# Patient Record
Sex: Female | Born: 1980
Health system: Southern US, Community
[De-identification: ages and names within clinical notes are randomized; demographics above are authoritative.]

## PROBLEM LIST (undated history)

## (undated) ENCOUNTER — Inpatient Hospital Stay (HOSPITAL_COMMUNITY): Payer: Self-pay

## (undated) DIAGNOSIS — B019 Varicella without complication: Secondary | ICD-10-CM

## (undated) DIAGNOSIS — K219 Gastro-esophageal reflux disease without esophagitis: Secondary | ICD-10-CM

## (undated) DIAGNOSIS — T8859XA Other complications of anesthesia, initial encounter: Secondary | ICD-10-CM

## (undated) DIAGNOSIS — R002 Palpitations: Secondary | ICD-10-CM

## (undated) DIAGNOSIS — F419 Anxiety disorder, unspecified: Secondary | ICD-10-CM

## (undated) DIAGNOSIS — D649 Anemia, unspecified: Secondary | ICD-10-CM

## (undated) HISTORY — DX: Varicella without complication: B01.9

## (undated) HISTORY — PX: BREAST BIOPSY: SHX20

## (undated) HISTORY — DX: Gastro-esophageal reflux disease without esophagitis: K21.9

## (undated) HISTORY — PX: TONSILLECTOMY: SUR1361

---
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
## 2002-01-09 ENCOUNTER — Emergency Department (HOSPITAL_COMMUNITY): Admission: EM | Admit: 2002-01-09 | Discharge: 2002-01-10 | Payer: Self-pay | Admitting: Emergency Medicine

## 2002-01-12 ENCOUNTER — Emergency Department (HOSPITAL_COMMUNITY): Admission: EM | Admit: 2002-01-12 | Discharge: 2002-01-12 | Payer: Self-pay | Admitting: Emergency Medicine

## 2002-05-04 ENCOUNTER — Emergency Department (HOSPITAL_COMMUNITY): Admission: EM | Admit: 2002-05-04 | Discharge: 2002-05-04 | Payer: Self-pay | Admitting: Emergency Medicine

## 2003-08-27 ENCOUNTER — Emergency Department (HOSPITAL_COMMUNITY): Admission: EM | Admit: 2003-08-27 | Discharge: 2003-08-27 | Payer: Self-pay | Admitting: Emergency Medicine

## 2004-11-24 ENCOUNTER — Emergency Department (HOSPITAL_COMMUNITY): Admission: EM | Admit: 2004-11-24 | Discharge: 2004-11-25 | Payer: Self-pay | Admitting: Emergency Medicine

## 2005-02-28 ENCOUNTER — Emergency Department (HOSPITAL_COMMUNITY): Admission: EM | Admit: 2005-02-28 | Discharge: 2005-02-28 | Payer: Self-pay | Admitting: Emergency Medicine

## 2005-03-02 ENCOUNTER — Emergency Department (HOSPITAL_COMMUNITY): Admission: EM | Admit: 2005-03-02 | Discharge: 2005-03-02 | Payer: Self-pay | Admitting: Family Medicine

## 2005-03-16 ENCOUNTER — Other Ambulatory Visit: Admission: RE | Admit: 2005-03-16 | Discharge: 2005-03-16 | Payer: Self-pay | Admitting: Family Medicine

## 2005-03-16 ENCOUNTER — Encounter: Payer: Self-pay | Admitting: Family Medicine

## 2005-03-16 ENCOUNTER — Ambulatory Visit: Payer: Self-pay | Admitting: Family Medicine

## 2005-03-20 ENCOUNTER — Encounter: Admission: RE | Admit: 2005-03-20 | Discharge: 2005-03-20 | Payer: Self-pay | Admitting: Family Medicine

## 2005-04-01 ENCOUNTER — Ambulatory Visit: Payer: Self-pay | Admitting: Family Medicine

## 2005-05-13 ENCOUNTER — Ambulatory Visit: Payer: Self-pay | Admitting: Family Medicine

## 2005-06-29 LAB — CONVERTED CEMR LAB: Pap Smear: NORMAL

## 2005-11-09 ENCOUNTER — Ambulatory Visit: Payer: Self-pay | Admitting: Family Medicine

## 2006-01-23 ENCOUNTER — Ambulatory Visit: Payer: Self-pay | Admitting: Family Medicine

## 2006-01-26 ENCOUNTER — Encounter: Admission: RE | Admit: 2006-01-26 | Discharge: 2006-01-26 | Payer: Self-pay | Admitting: Family Medicine

## 2006-05-26 ENCOUNTER — Ambulatory Visit: Payer: Self-pay | Admitting: Family Medicine

## 2006-05-26 ENCOUNTER — Other Ambulatory Visit: Admission: RE | Admit: 2006-05-26 | Discharge: 2006-05-26 | Payer: Self-pay | Admitting: Family Medicine

## 2006-05-26 ENCOUNTER — Encounter: Payer: Self-pay | Admitting: Family Medicine

## 2006-05-26 LAB — CONVERTED CEMR LAB
ALT: 20 units/L (ref 0–40)
AST: 25 units/L (ref 0–37)
Albumin: 3.5 g/dL (ref 3.5–5.2)
Alkaline Phosphatase: 54 units/L (ref 39–117)
BUN: 11 mg/dL (ref 6–23)
Basophils Absolute: 0 10*3/uL (ref 0.0–0.1)
Basophils Relative: 0.5 % (ref 0.0–1.0)
CO2: 28 meq/L (ref 19–32)
Calcium: 9.3 mg/dL (ref 8.4–10.5)
Chloride: 105 meq/L (ref 96–112)
Cholesterol: 154 mg/dL (ref 0–200)
Creatinine, Ser: 0.7 mg/dL (ref 0.4–1.2)
Eosinophils Absolute: 0.1 10*3/uL (ref 0.0–0.6)
Eosinophils Relative: 2 % (ref 0.0–5.0)
GFR calc Af Amer: 132 mL/min
GFR calc non Af Amer: 109 mL/min
Glucose, Bld: 90 mg/dL (ref 70–99)
HCT: 27.4 % — ABNORMAL LOW (ref 36.0–46.0)
HDL: 48.3 mg/dL (ref >39.0)
Hemoglobin: 8.7 g/dL — ABNORMAL LOW (ref 12.0–15.0)
LDL Cholesterol: 94 mg/dL (ref 0–99)
Lymphocytes Relative: 31.7 % (ref 12.0–46.0)
MCHC: 31.6 g/dL (ref 30.0–36.0)
MCV: 56 fL — ABNORMAL LOW (ref 78.0–100.0)
Monocytes Absolute: 0.6 10*3/uL (ref 0.2–0.7)
Monocytes Relative: 13 % — ABNORMAL HIGH (ref 3.0–11.0)
Neutro Abs: 2.5 10*3/uL (ref 1.4–7.7)
Neutrophils Relative %: 52.8 % (ref 43.0–77.0)
Platelets: 449 10*3/uL — ABNORMAL HIGH (ref 150–400)
Potassium: 3.8 meq/L (ref 3.5–5.1)
RBC: 4.89 M/uL (ref 3.87–5.11)
RDW: 30.6 % — ABNORMAL HIGH (ref 11.5–14.6)
Sodium: 138 meq/L (ref 135–145)
TSH: 1.59 microintl units/mL (ref 0.35–5.50)
Total Bilirubin: 0.6 mg/dL (ref 0.3–1.2)
Total CHOL/HDL Ratio: 3.2
Total Protein: 7.4 g/dL (ref 6.0–8.3)
Triglycerides: 61 mg/dL (ref 0–149)
VLDL: 12 mg/dL (ref 0–40)
WBC: 4.7 10*3/uL (ref 4.5–10.5)

## 2006-06-02 ENCOUNTER — Ambulatory Visit: Payer: Self-pay | Admitting: Family Medicine

## 2006-06-02 LAB — CONVERTED CEMR LAB
Basophils Absolute: 0.1 10*3/uL (ref 0.0–0.1)
Basophils Relative: 1.9 % — ABNORMAL HIGH (ref 0.0–1.0)
Eosinophils Absolute: 0.1 10*3/uL (ref 0.0–0.6)
Eosinophils Relative: 1.3 % (ref 0.0–5.0)
Ferritin: 16.9 ng/mL (ref 10.0–291.0)
Folate: 16.7 ng/mL
HCT: 29.3 % — ABNORMAL LOW (ref 36.0–46.0)
Hemoglobin: 9.3 g/dL — ABNORMAL LOW (ref 12.0–15.0)
Iron: 82 ug/dL (ref 42–145)
Lymphocytes Relative: 30.9 % (ref 12.0–46.0)
MCHC: 31.8 g/dL (ref 30.0–36.0)
MCV: 57.5 fL — ABNORMAL LOW (ref 78.0–100.0)
Monocytes Absolute: 0.7 10*3/uL (ref 0.2–0.7)
Monocytes Relative: 11.9 % — ABNORMAL HIGH (ref 3.0–11.0)
Neutro Abs: 3.1 10*3/uL (ref 1.4–7.7)
Neutrophils Relative %: 54 % (ref 43.0–77.0)
Platelets: 386 10*3/uL (ref 150–400)
RBC: 5.09 M/uL (ref 3.87–5.11)
RDW: 32.2 % — ABNORMAL HIGH (ref 11.5–14.6)
Saturation Ratios: 17.9 % — ABNORMAL LOW (ref 20.0–50.0)
Transferrin: 327.8 mg/dL (ref >=212.0)
Vitamin B-12: 491 pg/mL (ref 211–911)
WBC: 5.8 10*3/uL (ref 4.5–10.5)

## 2007-06-26 ENCOUNTER — Emergency Department (HOSPITAL_COMMUNITY): Admission: EM | Admit: 2007-06-26 | Discharge: 2007-06-26 | Payer: Self-pay | Admitting: Emergency Medicine

## 2007-08-07 ENCOUNTER — Emergency Department (HOSPITAL_COMMUNITY): Admission: EM | Admit: 2007-08-07 | Discharge: 2007-08-08 | Payer: Self-pay | Admitting: Emergency Medicine

## 2007-08-08 ENCOUNTER — Emergency Department (HOSPITAL_COMMUNITY): Admission: EM | Admit: 2007-08-08 | Discharge: 2007-08-08 | Payer: Self-pay | Admitting: Emergency Medicine

## 2007-08-08 ENCOUNTER — Emergency Department (HOSPITAL_COMMUNITY): Admission: EM | Admit: 2007-08-08 | Discharge: 2007-08-08 | Payer: Self-pay | Admitting: Family Medicine

## 2007-10-09 ENCOUNTER — Emergency Department (HOSPITAL_COMMUNITY): Admission: EM | Admit: 2007-10-09 | Discharge: 2007-10-09 | Payer: Self-pay | Admitting: Emergency Medicine

## 2007-10-16 ENCOUNTER — Emergency Department (HOSPITAL_COMMUNITY): Admission: EM | Admit: 2007-10-16 | Discharge: 2007-10-16 | Payer: Self-pay | Admitting: Emergency Medicine

## 2007-10-24 ENCOUNTER — Emergency Department (HOSPITAL_BASED_OUTPATIENT_CLINIC_OR_DEPARTMENT_OTHER): Admission: EM | Admit: 2007-10-24 | Discharge: 2007-10-24 | Payer: Self-pay | Admitting: Emergency Medicine

## 2007-11-06 ENCOUNTER — Emergency Department (HOSPITAL_BASED_OUTPATIENT_CLINIC_OR_DEPARTMENT_OTHER): Admission: EM | Admit: 2007-11-06 | Discharge: 2007-11-06 | Payer: Self-pay | Admitting: Emergency Medicine

## 2007-11-20 ENCOUNTER — Emergency Department (HOSPITAL_BASED_OUTPATIENT_CLINIC_OR_DEPARTMENT_OTHER): Admission: EM | Admit: 2007-11-20 | Discharge: 2007-11-20 | Payer: Self-pay | Admitting: Emergency Medicine

## 2007-12-17 ENCOUNTER — Emergency Department (HOSPITAL_BASED_OUTPATIENT_CLINIC_OR_DEPARTMENT_OTHER): Admission: EM | Admit: 2007-12-17 | Discharge: 2007-12-17 | Payer: Self-pay | Admitting: Emergency Medicine

## 2007-12-17 ENCOUNTER — Ambulatory Visit: Payer: Self-pay | Admitting: *Deleted

## 2007-12-17 DIAGNOSIS — R030 Elevated blood-pressure reading, without diagnosis of hypertension: Secondary | ICD-10-CM | POA: Insufficient documentation

## 2007-12-17 DIAGNOSIS — K219 Gastro-esophageal reflux disease without esophagitis: Secondary | ICD-10-CM | POA: Insufficient documentation

## 2007-12-17 DIAGNOSIS — D509 Iron deficiency anemia, unspecified: Secondary | ICD-10-CM | POA: Insufficient documentation

## 2007-12-17 DIAGNOSIS — G44229 Chronic tension-type headache, not intractable: Secondary | ICD-10-CM | POA: Insufficient documentation

## 2007-12-17 DIAGNOSIS — R51 Headache: Secondary | ICD-10-CM | POA: Insufficient documentation

## 2007-12-17 DIAGNOSIS — R42 Dizziness and giddiness: Secondary | ICD-10-CM | POA: Insufficient documentation

## 2007-12-17 DIAGNOSIS — D500 Iron deficiency anemia secondary to blood loss (chronic): Secondary | ICD-10-CM | POA: Insufficient documentation

## 2007-12-17 HISTORY — DX: Elevated blood-pressure reading, without diagnosis of hypertension: R03.0

## 2007-12-21 ENCOUNTER — Ambulatory Visit: Payer: Self-pay | Admitting: *Deleted

## 2007-12-24 LAB — CONVERTED CEMR LAB
BUN: 9 mg/dL (ref 6–23)
Basophils Absolute: 0 10*3/uL (ref 0.0–0.1)
Basophils Relative: 0.6 % (ref 0.0–3.0)
CO2: 27 meq/L (ref 19–32)
Calcium: 9.6 mg/dL (ref 8.4–10.5)
Chloride: 104 meq/L (ref 96–112)
Creatinine, Ser: 0.6 mg/dL (ref 0.4–1.2)
Eosinophils Absolute: 0.1 10*3/uL (ref 0.0–0.7)
Eosinophils Relative: 1.4 % (ref 0.0–5.0)
GFR calc Af Amer: 155 mL/min
GFR calc non Af Amer: 128 mL/min
Glucose, Bld: 100 mg/dL — ABNORMAL HIGH (ref 70–99)
HCT: 31.3 % — ABNORMAL LOW (ref 36.0–46.0)
Hemoglobin: 9.9 g/dL — ABNORMAL LOW (ref 12.0–15.0)
Lymphocytes Relative: 42.7 % (ref 12.0–46.0)
MCHC: 31.6 g/dL (ref 30.0–36.0)
MCV: 65.4 fL — ABNORMAL LOW (ref 78.0–100.0)
Monocytes Absolute: 0.6 10*3/uL (ref 0.1–1.0)
Monocytes Relative: 13.6 % — ABNORMAL HIGH (ref 3.0–12.0)
Neutro Abs: 1.9 10*3/uL (ref 1.4–7.7)
Neutrophils Relative %: 41.7 % — ABNORMAL LOW (ref 43.0–77.0)
Platelets: 325 10*3/uL (ref 150–400)
Potassium: 3.7 meq/L (ref 3.5–5.1)
RBC: 4.79 M/uL (ref 3.87–5.11)
RDW: 21.3 % — ABNORMAL HIGH (ref 11.5–14.6)
Sodium: 140 meq/L (ref 135–145)
WBC: 4.5 10*3/uL (ref 4.5–10.5)

## 2007-12-31 ENCOUNTER — Ambulatory Visit: Payer: Self-pay | Admitting: *Deleted

## 2007-12-31 ENCOUNTER — Ambulatory Visit (HOSPITAL_BASED_OUTPATIENT_CLINIC_OR_DEPARTMENT_OTHER): Admission: RE | Admit: 2007-12-31 | Discharge: 2007-12-31 | Payer: Self-pay | Admitting: *Deleted

## 2008-01-17 ENCOUNTER — Ambulatory Visit: Payer: Self-pay | Admitting: *Deleted

## 2008-01-17 DIAGNOSIS — J019 Acute sinusitis, unspecified: Secondary | ICD-10-CM | POA: Insufficient documentation

## 2008-01-17 DIAGNOSIS — R002 Palpitations: Secondary | ICD-10-CM | POA: Insufficient documentation

## 2008-01-17 DIAGNOSIS — F329 Major depressive disorder, single episode, unspecified: Secondary | ICD-10-CM | POA: Insufficient documentation

## 2008-01-28 ENCOUNTER — Telehealth (INDEPENDENT_AMBULATORY_CARE_PROVIDER_SITE_OTHER): Payer: Self-pay | Admitting: *Deleted

## 2008-02-01 ENCOUNTER — Emergency Department (HOSPITAL_COMMUNITY): Admission: EM | Admit: 2008-02-01 | Discharge: 2008-02-01 | Payer: Self-pay | Admitting: Emergency Medicine

## 2008-02-01 ENCOUNTER — Emergency Department (HOSPITAL_BASED_OUTPATIENT_CLINIC_OR_DEPARTMENT_OTHER): Admission: EM | Admit: 2008-02-01 | Discharge: 2008-02-01 | Payer: Self-pay | Admitting: Emergency Medicine

## 2008-02-05 ENCOUNTER — Encounter (INDEPENDENT_AMBULATORY_CARE_PROVIDER_SITE_OTHER): Payer: Self-pay | Admitting: *Deleted

## 2008-02-05 ENCOUNTER — Telehealth (INDEPENDENT_AMBULATORY_CARE_PROVIDER_SITE_OTHER): Payer: Self-pay | Admitting: *Deleted

## 2008-02-13 ENCOUNTER — Ambulatory Visit: Payer: Self-pay | Admitting: Cardiology

## 2008-02-18 ENCOUNTER — Encounter: Payer: Self-pay | Admitting: Cardiology

## 2008-02-18 ENCOUNTER — Ambulatory Visit: Payer: Self-pay | Admitting: Cardiology

## 2008-02-22 ENCOUNTER — Encounter (INDEPENDENT_AMBULATORY_CARE_PROVIDER_SITE_OTHER): Payer: Self-pay | Admitting: *Deleted

## 2010-05-30 LAB — CONVERTED CEMR LAB
Glucose, Bld: 79 mg/dL (ref 70–99)
Hemoglobin: 11.4 g/dL — ABNORMAL LOW (ref 12.0–15.0)
TSH: 1.19 microintl units/mL (ref 0.35–5.50)

## 2010-06-01 NOTE — Assessment & Plan Note (Signed)
Summary: headache   Vital Signs:  Patient Profile:   30 Years Old Female Height:     61 inches Weight:      206 pounds O2 treatment:    Room Air Temp:     98.2 degrees F oral Pulse rate:   90 / minute Pulse rhythm:   regular Resp:     20 per minute BP sitting:   120 / 80  (right arm) Cuff size:   large  Vitals Entered By: Darra Lis RMA (January 17, 2008 8:47 AM)             Is Patient Diabetic? No     PCP:  Dr. Andrey Campanile  Chief Complaint:  Headaches and dizziness.  History of Present Illness: Patient was seen on 12/17/07 for complaint of headaches and dizziness and GERD.  She has had "vertigo" for years per her report, but nobody had ever worked up the vertigo or told her why she had vertigo.  The vertigo symptoms have not changed. After further d/w the patient, it was found that she had multiple behaviors that could have been contributing to both the headaches and dizziness including: skipping meals, had significantly less than her usual 6+ servings of caffeine on some days in the past week, had been binging on sugar, and very little water intake, excessive intake of NSAIDs, lost her eyeglasses and did not replace them, and stopped taking her iron supplement for anemia months ago. Neuro exam was benign at that appt and Patient contracted to stop skipping meals, have protein every 4 hours while awake, drink 64oz of water / day, cut out simple sugars, and slowly cut back on caffeine.  In addition, she was to get new glasses, limit her NSAID use, and restart her iron supplements.  She was to keep a headache diary.  She was to try OTC prilosec for the GERD sx.  Labs were done that showed significant anemia with a Hgb 9.9 and normal BMP.  TSH done recently was reported as normal by patient.  Patient returned on 12/31/07 reporting that the headaches were GREATLY improved and had not had one in several days, but she forgot her headache diary.  She still had the vertigo intermittently -  but less often.  She had tried not to skip meals, but was still not eating protein with each meal.  She had tried to improve her fluid intake and decrease her caffeine, but was still having 3-4 servings of caffeine / day.  She had cut back on her NSAIDs.  She was taking her iron supplement every day.  She still had not gotten new glasses. At that appt, the patient contracted to complete the missing advice as mentioned here.  We also ordered a CT scan of head for completeness.  That was negative.Patient returns today quite anxious.  She says she has been looking on the internet and talking to friends.  She says she  is very concerned about many possibilities for the vertigo and headaches.  I reassure her that the head CT was WNL and that is very comforting.  She is very tearful and much more upset than previous visits.  She says she just wants an answer for the symptoms and this is reasonable.  She has now followed most of  the advice I have given including drinking at least 64oz water / day, no  caffeine, limited NSAIDs, protein every 4-5 hours, no binging on sugar, takes her iron supplements every day.  She has  still not seen the eye doctor to replace her missing eye glasses.  She brings her headache diary.  She has had increased frequency since her last visit.  She is also recording some additional new symptoms including some left sided jaw pain and cold sensation traveling from the jaw forward toward the chin.  She records a bubbling/popping feeling in the back of her head.  She reports that her head is tender to the touch at times.  She records other various, unusual and ill-defined symptoms.  When I again bring up the possibility of depression / anxiety, she admits this may be a possibility.  She does admit to stress and increased tearfulness, decreased sleep, decreased interest, increased guilt, decreased energy, decreased concentration.  She is reluctant to accept the dx of depression or to try  medication.  Then she brings up worry over her heart.  No report of chest pain, but she relates some periods of palpitations since the last appointment.  She has been more anxious, but does not recall if the palpitations occurred when anxious.  They were only fleeting.  no other associated signs noted.    The headaches have never had photophobia, nausea, aura or classic migraine signs.  They are not band like.  The are sometimes in the sinus area, but not exclusively.    Prior Medication List:  PROTONIX 40 MG TBEC (PANTOPRAZOLE SODIUM) one tab each day FEOSOL 45 MG  TABS (IRON) Take 1 tablet by mouth once a day ANTIVERT 25 MG  TABS (MECLIZINE HCL) 1 tab every 8 hours as needed dizziness   Current Allergies (reviewed today): ! FLAGYL  Past Medical History:    Anemia-iron deficiency    GERD    vertigo    Headache  Past Surgical History:    Reviewed history from 12/17/2007 and no changes required:       Tonsillectomy   Family History:    Reviewed history from 12/17/2007 and no changes required:       Family History Diabetes 1st degree relative       Family History Hypertension  Social History:    Reviewed history from 12/17/2007 and no changes required:       Occupation:mortgage analyst in [employer]       Single       no children   Risk Factors:  Tobacco use:  never Passive smoke exposure:  yes Drug use:  no HIV high-risk behavior:  no Caffeine use:  2 drinks per day Alcohol use:  yes    Type:  liquor - rare    Has patient --       Felt need to cut down:  no       Been annoyed by complaints:  no       Felt guilty about drinking:  no       Needed eye opener in the morning:  no    Counseled to quit/cut down alcohol use:  no Exercise:  yes    Times per week:  4    Type:  cardio Seatbelt use:  100 % Sun Exposure:  occasionally  Family History Risk Factors:    Family History of MI in females < 33 years old:  no    Family History of MI in males < 93 years old:   no  PAP Smear History:    Date of Last PAP Smear:  06/29/2005   Review of Systems  The patient denies anorexia, fever, weight loss, weight gain, vision  loss, decreased hearing, hoarseness, chest pain, syncope, dyspnea on exertion, peripheral edema, prolonged cough, hemoptysis, abdominal pain, melena, hematochezia, muscle weakness, suspicious skin lesions, transient blindness, difficulty walking, abnormal bleeding, and enlarged lymph nodes.         see HPI   Physical Exam  Neurologic:     alert & oriented X3, cranial nerves II-XII intact, strength normal in all extremities, sensation intact to light touch, gait normal, and Romberg negative.   Additional Exam:     General:     Well-developed, well nourished, well hydrated, in no acute distress Head:     Normocephalic and atraumatic without obvious abnormalities.  Eyes:     No corneal or conjunctival inflammation. EOMI. PERRLA. Funduscopic exam benign, Vision grossly normal. Ears:     External ear shows no significant lesions or deformities.  Otoscopic examination reveals clear canals, tympanic membranes normal bilaterally. Hearing is grossly normal. Nose:     External nasal examination shows no deformity or inflammation. Nasal mucosa are pink and moist without lesions or exudates. Mouth:     Oral mucosa and oropharynx without lesions, erythema or exudates.  Teeth in good repair. no postnasal drip and no tongue abnormalities.   Neck:     supple, full ROM, no masses, and no thyromegaly.   Chest Wall:     No deformities, masses, or tenderness noted. Lungs:     Normal respiratory effort, chest expands symmetrically with good air flow noted. Lungs clear to auscultation with no crackles, rales or wheezes.   Heart:     Normal rate and  rhythm. S1 and S2 normal, without gallop, murmur, click, rub n  Msk:     No gross deformity noted of cervical, thoracic or lumbar spine. normal ROM. no muscle atrophy noted.   Extremities:     No  clubbing, cyanosis, edema, or deformity noted, grossly normal full range of motion with all joints.   Skin:     Intact without suspicious lesions or rashes Psych:     Cognition and judgment appear intact. Alert and cooperative with normal attention span and concentration. No apparent delusions, illusions, hallucinations, normally interactive, good eye contact, not anxious or depressed appearing, and not agitated.       Impression & Recommendations:  Problem # 1:  HEADACHE (ICD-784.0) The exact etiology is unclear.  Will TSH and recheck glucose.  BMP recently done was WNL.  CT scan was negative.  She still needs to see her eye doctor and get replacement glasses - she has been without them for months.  She also needs to see her dentist and eliminate any possible dental etiology including TMJ.  At this point, I have elected to tried low grade sinusitis symptoms with z-pak.  We will also try using fioricet for headache medication.  In addition, I am going to refer to neurology for further evaluation.  As the internet searching seems to lead to much increased anxiety, I have advised her to refrain from that for now if possible. Orders: TLB-TSH (Thyroid Stimulating Hormone) (84443-TSH) TLB-Glucose, QUANT (82947-GLU) Neurology Referral (Neuro)  Her updated medication list for this problem includes:    Fioricet 50-325-40 Mg Tabs (Butalbital-apap-caffeine) .Marland Kitchen... Take 1-2 tabs every 6 hours as needed headache  More than 45 minutes spent with patient today   Problem # 2:  ANEMIA-IRON DEFICIENCY (ICD-280.9) We will recheck her hgb now that she is on iron supplement daily. Her updated medication list for this problem includes:    Feosol 45 Mg Tabs (  Iron) ..... Take 1 tablet by mouth once a day  Orders: TLB-Hemoglobin (Hgb) (85018-HGB)   Problem # 3:  SINUSITIS, ACUTE (ICD-461.9) Patient has had some symptoms of sinusitis - although the symptoms also include a wide variety of other non-sinusits  symptoms.  To be complete, I am going to treat her with z-pak to see if we get any improvement. Her updated medication list for this problem includes:    Zithromax Z-pak 250 Mg Tabs (Azithromycin) .Marland Kitchen... As directed   Problem # 4:  PALPITATIONS (ICD-785.1) EKG with NSR at 80 bpm with no ST elevation / depression or acute changes. Reviewed with patient that we can order a Holter monitor if this continues or worsens.  Also reviewed with patient "red flag" symptoms that would require immediate medical attention. Orders: EKG w/ Interpretation (93000)   Problem # 5:  DEPRESSION (ICD-311) I do believe the patient is dealing with depression, but she is not agreeable to trying medication at this time.  She will consider it and let me know when and if she would like to start medication.  She denies any suicidal / homicidal thoughts / plans.  She does make a Engineer, manufacturing systems.  I don't believe that her headaches, vertigo and Left sided upper and lower parasthesias can be explained by psycho-social issues.  I do feel she would benefit from an antidepressant.  We will revist this idea at her next appt.  Problem # 6:  VERTIGO (ICD-780.4) Has been present for years.  Unchanged. Her updated medication list for this problem includes:    Antivert 25 Mg Tabs (Meclizine hcl) .Marland Kitchen... 1 tab every 8 hours as needed dizziness  Orders: TLB-TSH (Thyroid Stimulating Hormone) (16109-UEA) Neurology Referral (Neuro)   Complete Medication List: 1)  Protonix 40 Mg Tbec (Pantoprazole sodium) .... One tab each day 2)  Feosol 45 Mg Tabs (Iron) .... Take 1 tablet by mouth once a day 3)  Antivert 25 Mg Tabs (Meclizine hcl) .Marland Kitchen.. 1 tab every 8 hours as needed dizziness 4)  Zithromax Z-pak 250 Mg Tabs (Azithromycin) .... As directed 5)  Fioricet 50-325-40 Mg Tabs (Butalbital-apap-caffeine) .... Take 1-2 tabs every 6 hours as needed headache    Prescriptions: FIORICET 50-325-40 MG TABS (BUTALBITAL-APAP-CAFFEINE) take 1-2 tabs  every 6 hours as needed headache  #30 x 1   Entered and Authorized by:   Paulo Fruit MD   Signed by:   Paulo Fruit MD on 01/17/2008   Method used:   Electronically to        [facility] [address] (retail)       [phone]       [place], [place]  [address]       Ph: [phone]       Fax: [phone]   RxID:   [id number] ZITHROMAX Z-PAK 250 MG TABS (AZITHROMYCIN) as directed  #1 x 0   Entered and Authorized by:   Paulo Fruit MD   Signed by:   Paulo Fruit MD on 01/17/2008   Method used:   Electronically to        [facility] [address] (retail)       [phone]       [place], [place]  [address]       Ph: [phone]       Fax: [phone]   RxID:   [id number]  ][Headache Q&E-CCC]

## 2010-06-01 NOTE — Progress Notes (Signed)
Summary: wants call back  Phone Note Call from Patient   Caller: Patient Details for Reason: wants call back Summary of Call: PC FROM PT. STATING SHE GOT A MESSAGE TO CALL FOR LAB RESULTS.  PLEASE CALL HER @ [phone] Initial call taken by: Marisa Hua,  January 28, 2008 12:32 PM  Follow-up for Phone Call        Left message on patient's vm to call back. Follow-up by: Darra Lis RMA,  January 28, 2008 2:09 PM  Additional Follow-up for Phone Call Additional follow up Details #1::        Patient calling to speak with the doctor about her neurologist appointment and her symptoms - not about lab results Additional Follow-up by: Darra Lis RMA,  January 28, 2008 2:29 PM    Additional Follow-up for Phone Call Additional follow up Details #2::    Patient feeling very weak and like she is going to pass out.  Dizziness is severe today.   Her neurology appt is not for another month.  Her head CT scan and labs and EKG all have been negative in the office.   At this point, with the acute changes in symptoms, I have asked the patient to go to the ER ASAP for full evaluation.  she agrees to comply. Follow-up by: Paulo Fruit MD,  January 28, 2008 2:31 PM  Additional Follow-up for Phone Call Additional follow up Details #3:: Details for Additional Follow-up Action Taken: Attempted to call patient to check on status.  Patient did not go to the ER.  Patient states that she went home and laid down for awhile and the symptoms improved.   Today, patient states that she's had flares like the one listed above before and she always goes back to baseline with rest.  She is back to baseline today.  She has an appointment with neurology on 02/26/08 and feels she can wait for that appt, but is also on their cancellation list to get an earlier appt if one comes open  She is going to see the eye doctor Monday to be sure that is not playing a role in her headaches / dizziness.  She is  to call for any concerns.  "red flag" symptoms are reviewed that would require immediate medical attention. Additional Follow-up by: Paulo Fruit MD,  January 30, 2008 10:49 AM

## 2010-06-01 NOTE — Progress Notes (Signed)
Summary: Phone note  Phone Note Call from Patient   Reason for Call: Talk to Doctor Details for Reason: re: appt. Summary of Call: Please call pt. back about a appt. question. Thank you, Michaelle Copas Initial call taken by: Michaelle Copas,  February 05, 2008 12:24 PM  Follow-up for Phone Call        Returned patient's phone call. Left message on voice mail to return call. Follow-up by: Darra Lis RMA,  February 06, 2008 9:40 AM  Additional Follow-up for Phone Call Additional follow up Details #1::        Patient called and would like to talk to Dr. Andrey Campanile directly. Patient is still having same symptoms. Patient can be reached at [phone]. Additional Follow-up by: Darra Lis RMA,  February 06, 2008 9:41 AM  New Problems: DIZZINESS (ICD-780.4)   Additional Follow-up for Phone Call Additional follow up Details #2::    Called patient. No answer.  Left message on the machine for patient to call back when she is available by phone for several hours.  I will try again when she is available. Paulo Fruit MD,  February 06, 2008 9:53 AM  Additional Follow-up for Phone Call Additional follow up Details #3:: Details for Additional Follow-up Action Taken: Patient called.  She has now seen neurology and had a brain and neck MRI.  The neurologist told the patient that her problem with the dizziness and headaches is not neurologic.  I do not have that consult available at this time.  She also saw the optometrist and was found to need glasses, but has not obtained them - yet.  At this point, we will make an appointment with ENT and cardiology for further evaluation.  "red flag" symptoms reviewed that would require immediate medical attention Additional Follow-up by: Paulo Fruit MD,  February 06, 2008 12:03 PM  New Problems: DIZZINESS (ICD-780.4)

## 2010-06-01 NOTE — Assessment & Plan Note (Signed)
Summary: 2 WEEK ROV-CH   Vital Signs:  Patient Profile:   30 Years Old Female LMP:     12/26/2007 Height:     61 inches Weight:      203 pounds BMI:     38.50 O2 treatment:    Room Air Temp:     98.2 degrees F oral Pulse rate:   100 / minute Pulse rhythm:   regular Resp:     20 per minute BP sitting:   122 / 80  (left arm) Cuff size:   regular  Vitals Entered By: Darra Lis RMA (December 31, 2007 8:13 AM)  Menstrual History: LMP (date): 12/26/2007                 Visit Type:  follow up PCP:  Dr. Andrey Campanile  Chief Complaint:  follow up for dizziness.Marland Kitchen  History of Present Illness: Patient was seen on 12/17/07 for complaint of headaches and dizziness and GERD.  She has had "vertigo" for years per her report, but nobody had ever worked up the vertigo or told her why she had vertigo.    After further d/w the patient, it was found that she had multiple behaviors that could have been contributing to both the headaches and dizziness including: skipping meals, had significantly less than her usual 6+ servings of caffeine on some days in the past week, had been binging on sugar, and very little water intake, excessive intake of NSAIDs, lost her eyeglasses and did not replace them, and stopped taking her iron supplement for anemia months ago.  Neuro exam was benign.  Patient contracted to stop skipping meals, have protein every 4 hours while awake, drink 64oz of water / day, cut out simple sugars, and slowly cut back on caffeine.  In addition, she was to get new glasses, limit her NSAID use, and restart her iron supplements.  She was to keep a headache diary.  She was to try OTC prilosec for the GERD sx.  Labs were done that showed significant anemia with a Hgb 9.9 and normal BMP.  TSH done recently was reported as normal by patient.  Patient returns today reporting that the headaches are GREATLY improved and has not had one in several days, but she forgot her headache diary.  She still has  the vertigo intermittently - but less often.  She has tried not to skip meals, but is still not eating protein with each meal.  She has tried to improve her fluid intake and decrease her caffeine, but is still having 3-4 servings of caffeine / day.  She has cut back on her NSAIDs.  She is taking her iron supplement.  She has not gotten new glasses, yet.  The prilosec is not keeping her sx free, but has improved the GERD sx.      Updated Prior Medication List: PRILOSEC OTC 20 MG  TBEC (OMEPRAZOLE MAGNESIUM) 1-2 caps by mouth every day FEOSOL 45 MG  TABS (IRON) Take 1 tablet by mouth once a day ANTIVERT 25 MG  TABS (MECLIZINE HCL) 1 tab every 8 hours as needed dizziness  Current Allergies (reviewed today): ! FLAGYL  Past Medical History:    Reviewed history from 12/17/2007 and no changes required:       Anemia-iron deficiency       GERD       vertigo  Past Surgical History:    Reviewed history from 12/17/2007 and no changes required:       Tonsillectomy  Family History:    Reviewed history from 12/17/2007 and no changes required:       Family History Diabetes 1st degree relative       Family History Hypertension  Social History:    Reviewed history from 12/17/2007 and no changes required:       Occupation:mortgage analyst in [employer]       Single       no children   Risk Factors:     Has patient --       Felt need to cut down:  no       Been annoyed by complaints:  no       Felt guilty about drinking:  no       Needed eye opener in the morning:  no    Counseled to quit/cut down alcohol use:  no   Review of Systems       No nausea / vomiting / diarrhea / fever / chills / chest pain / SOB   Physical Exam  Neurologic:     alert & oriented X3, cranial nerves II-XII intact, strength normal in all extremities, gait normal, and Romberg negative, fundi benign, neuro intact  Additional Exam:     General:     Well-developed, well nourished, well hydrated, in no acute distress;  appropriate and cooperative   Head:     Normocephalic and atraumatic without obvious abnormalities.  Eyes:     No corneal or conjunctival inflammation. EOMI. PERRLA. Funduscopic exam benign, Vision grossly normal. Ears:     External ear shows no significant lesions or deformities.  Otoscopic examination reveals clear canals, tympanic membranes normal bilaterally. Hearing is grossly normal. Nose:     External nasal examination shows no deformity or inflammation. Nasal mucosa are pink and moist without lesions or exudates. Mouth:     Oral mucosa and oropharynx without lesions, erythema or exudates.  Teeth in good repair. no postnasal drip and no tongue abnormalities.   Neck:     supple, full ROM, no masses, and no thyromegaly.   Chest Wall:     No deformities, masses, or tenderness noted. Lungs:     Normal respiratory effort, chest expands symmetrically with good air flow noted. Lungs clear to auscultation with no crackles, rales or wheezes.   Heart:     Normal rate and  rhythm. S1 and S2 normal, without gallop, murmur, click, rub n  Msk:     No gross deformity noted of cervical, thoracic or lumbar spine. normal ROM. no muscle atrophy noted.   Extremities:     No clubbing, cyanosis, edema, or deformity noted, grossly normal full range of motion with all joints.   Skin:     Intact without suspicious lesions or rashes Psych:     Cognition and judgment appear intact. Alert and cooperative with normal attention span and concentration. No apparent delusions, illusions, hallucinations, normally interactive, good eye contact, not anxious or depressed appearing, and not agitated.       Impression & Recommendations:  Problem # 1:  HEADACHE (ICD-784.0) Much less frequent with behavioral / dietary changes - per HPI.  Will continue to work on same - still with much room for improvement.  However, for completeness, will get CT scan.  f/u in one month and bring headache diary.  "red flag" sx  reviewed that would require immediate medical attention. Orders: CT with Contrast (CT w/ contrast)   Problem # 2:  VERTIGO (ICD-780.4) Much less frequent with behavioral /  dietary changes - per HPI.  Will continue to work on same - still with much room for improvement.  Get new glasses!  However, for completeness, will get CT scan.  f/u in one month and bring headache diary.  If the vertigo continues and the CT scan is negative, will refer to ENT.    "red flag" sx reviewed that would require immediate medical attention. Her updated medication list for this problem includes:    Antivert 25 Mg Tabs (Meclizine hcl) .Marland Kitchen... 1 tab every 8 hours as needed dizziness  Orders: CT with Contrast (CT w/ contrast)   Problem # 3:  ANEMIA-IRON DEFICIENCY (ICD-280.9) conitnue meds and repeat CBC at f/u appt. Her updated medication list for this problem includes:    Feosol 45 Mg Tabs (Iron) .Marland Kitchen... Take 1 tablet by mouth once a day   Problem # 4:  GERD (ICD-530.81) patient was given samples of protonix Her updated medication list for this problem includes:    Protonix 40 Mg Tbec (Pantoprazole sodium) ..... One tab each day   Complete Medication List: 1)  Protonix 40 Mg Tbec (Pantoprazole sodium) .... One tab each day 2)  Feosol 45 Mg Tabs (Iron) .... Take 1 tablet by mouth once a day 3)  Antivert 25 Mg Tabs (Meclizine hcl) .Marland Kitchen.. 1 tab every 8 hours as needed dizziness   Patient Instructions: 1)  Please schedule a follow-up appointment in 1 month.   ]

## 2010-06-01 NOTE — Consult Note (Signed)
Summary: [facility]  [facility]   Imported By: Lanelle Bal 02/08/2008 12:46:25  _____________________________________________________________________  External Attachment:    Type:   Image     Comment:   External Document

## 2010-06-01 NOTE — Assessment & Plan Note (Signed)
Summary: new patient acute/mhf   Vital Signs:  Patient Profile:   30 Years Old Female LMP:     12/01/2007 Height:     61 inches Weight:      202 pounds BMI:     38.31 O2 treatment:    Room Air Temp:     98.8 degrees F oral Pulse rate:   93 / minute Pulse rhythm:   regular Resp:     20 per minute BP sitting:   143 / 92  (right arm) Cuff size:   regular  Pt. in pain?   yes    Location:   head    Intensity:   6    Type:       dull & pressure  Vitals Entered By: Darra Lis RMA (December 17, 2007 1:51 PM)  Menstrual History: LMP (date): 12/01/2007 LMP - Character: normal LMP - Reliable: Yes Menarche: 11 Menses interval: 28 days Menstrual flow: 7 days              Is Patient Diabetic? No     Visit Type:  acute PCP:  Dr. Laury Axon  Chief Complaint:  headache, dizziness, and Headaches.  History of Present Illness: Patient tells me that she does not have insurance and so she has had very little medical care in the past few years.  She usually sees Dr. Laury Axon, but they could not see her.  She is here for acute visit.  Patient has a long h/o headaches.  No h/o migraines.  She has had headaches now for 5 days intermittently.  Sometimes they go away with aleve and sometimes with rest, but for the past 24 hours they have been pretty constant.  The headache is occipital and frontal.  There is no associated nausea or photophobia.  No slurred speech or blurred vision.  There is some dizziness, but Patient has had trouble with "vertigo" for years.    She has never been told why she has vertigo or how to treat it.  She has never lost consciousness.  It will come on for days at a time and then resolve for months sometimes.  It has been present for several days this time.  She especially notices it when she goes from sitting to standing and when she turns her head or eyes.  It is better if she does not make sudden changes / movements of her head.   She has had a h/o "ear problems" in the  past - she gets "pressure and fluid on the ears" sometimes.    Patient skips breakfast.  Gets up at 7am and doesn't eat anything until about 1pm - sometimes just a salad.  She drinks at least 6 serve of caffeine / day and only 60 oz water / day.  She does have sugar daily - sometimes to excess.  She diets often and will change her food intake drastically from day to day.  After we start talking about these details, the patient states "Now that I think about it, I did have less caffeine in the past few days than usual."  She does not know if the decrease in caffeine came before the headaches started, but she thinks so.  She has also been under increased stress lately.  In addition, she is supposed to wear glasses - but no longer has any and hasn't been able to afford to get new ones.    She also ran out nexium and stopped taking her iron supplement.  Headaches      This is a 31 year old woman who presents with Headaches.  The patient denies nausea, vomiting, sweats, tearing of eyes, nasal congestion, sinus pain, sinus pressure, photophobia, and phonophobia.  The headache is described as intermittent, throbbing, dull, and pressure-like.  The patient denies the following high-risk features: fever, neck pain/stiffness, vision loss or change, focal weakness, altered mental status, rash, trauma, pain worse with exertion, and new type of headache.  She has been taking a lot of Aleve for her headaches.    Updated Prior Medication List: NEXIUM 40 MG  CPDR (ESOMEPRAZOLE MAGNESIUM) Take 1 tablet by mouth once a day FEOSOL 45 MG  TABS (IRON) Take 1 tablet by mouth once a day  Current Allergies (reviewed today): ! FLAGYL  Past Medical History:    Anemia-iron deficiency    GERD    vertigo  Past Surgical History:    Tonsillectomy   Family History:    Family History Diabetes 1st degree relative    Family History Hypertension  Social History:    Engineer, structural in [employer]    Single    no  children   Risk Factors:  Tobacco use:  never Passive smoke exposure:  yes Drug use:  no HIV high-risk behavior:  no Caffeine use:  2 drinks per day Alcohol use:  yes    Type:  liquor - rare    Has patient --       Felt need to cut down:  no       Been annoyed by complaints:  no       Felt guilty about drinking:  no       Needed eye opener in the morning:  no    Comments:  social    Counseled to quit/cut down alcohol use:  no Exercise:  yes    Times per week:  4    Type:  cardio Seatbelt use:  100 % Sun Exposure:  occasionally  Family History Risk Factors:    Family History of MI in females < 90 years old:  no    Family History of MI in males < 2 years old:  no  PAP Smear History:     Date of Last PAP Smear:  06/29/2005    Results:  Normal    Review of Systems       The patient complains of headaches and severe indigestion/heartburn.  The patient denies anorexia, fever, weight loss, weight gain, vision loss, decreased hearing, hoarseness, chest pain, syncope, dyspnea on exertion, peripheral edema, prolonged cough, hemoptysis, abdominal pain, melena, hematochezia, hematuria, incontinence, genital sores, muscle weakness, suspicious skin lesions, transient blindness, depression, unusual weight change, abnormal bleeding, enlarged lymph nodes, and angioedema.     Physical Exam  Neurologic:     alert & oriented X3, cranial nerves II-XII intact, strength normal in all extremities, gait normal, and Romberg negative, fundi benign, neuro intact  Additional Exam:     General:     Well-developed, well nourished, well hydrated, in no acute distress; appropriate and cooperative   Head:     Normocephalic and atraumatic without obvious abnormalities.  Eyes:     No corneal or conjunctival inflammation. EOMI. PERRLA. Funduscopic exam benign, Vision grossly normal. Ears:     External ear shows no significant lesions or deformities.  Otoscopic examination reveals clear canals,  tympanic membranes normal bilaterally. Hearing is grossly normal. Nose:     External nasal examination shows no deformity or inflammation. Nasal mucosa  are pink and moist without lesions or exudates. Mouth:     Oral mucosa and oropharynx without lesions, erythema or exudates.  Teeth in good repair. no postnasal drip and no tongue abnormalities.   Neck:     supple, full ROM, no masses, and no thyromegaly.   Chest Wall:     No deformities, masses, or tenderness noted. Lungs:     Normal respiratory effort, chest expands symmetrically with good air flow noted. Lungs clear to auscultation with no crackles, rales or wheezes.   Heart:     Normal rate and  rhythm. S1 and S2 normal, without gallop, murmur, click, rub  Abdomen:     Bowel sounds positive, abdomen soft and non-tender without masses, organomegaly or hernias noted. no distention  Msk:     No gross deformity noted of cervical, thoracic or lumbar spine. normal ROM. no muscle atrophy noted.   Extremities:     No clubbing, cyanosis, edema, or deformity noted, grossly normal full range of motion with all joints.   Skin:     Intact without suspicious lesions or rashes Psych:     Cognition and judgment appear intact. Alert and cooperative with normal attention span and concentration. No apparent delusions, illusions, hallucinations, normally interactive, good eye contact, not anxious or depressed appearing, and not agitated.       Impression & Recommendations:  Problem # 1:  HEADACHE (ICD-784.0) Benign neuro exam.   I believe this is multi-factorial.  Possibly including rebound from NSAIDS, caffeine headaches, eye strain since she does not have her glasses,  hypoglycemia from skipping meals and sugar binges, dehydration, severe anemia since she stopped her supplement, etc.   She was offered a CT scan, but declined due to having no insurance.  We will try having patient eat three balanced meals with protein and protein every 4 hours  throughout the day, she will SLOWLY reduce her caffeine to one serve or less, she will increase her fluid intake.  She agrees to see an eye doctor for new glasses.  She agrees to cut back on sugar binges.  She agrees to keep a headache diary.  She agrees to seek immediate medical attention if any of the "red flag" sx occur.  all questions anaswered.  She will f/u in 2 weeks.  She will call with any concerns. Orders: TLB-CBC Platelet - w/Differential (85025-CBCD) TLB-BMP (Basic Metabolic Panel-BMET) (80048-METABOL)   Problem # 2:  VERTIGO (ICD-780.4) I believe the vertigo may also be multi-factorial.  She will make changes as above and we will check some labs.  She states that she had her TSH checked within the last 4 months and it was WNL.  She does give some hx of sinus / ear problems in the past, but none today.   Orders: TLB-CBC Platelet - w/Differential (85025-CBCD) TLB-BMP (Basic Metabolic Panel-BMET) (80048-METABOL)  Her updated medication list for this problem includes:    Antivert 25 Mg Tabs (Meclizine hcl) .Marland Kitchen... 1 tab every 8 hours as needed dizziness   Problem # 3:  ANEMIA-IRON DEFICIENCY (ICD-280.9) will recheck her CBC Her updated medication list for this problem includes:    Feosol 45 Mg Tabs (Iron) .Marland Kitchen... Take 1 tablet by mouth once a day   Problem # 4:  GERD (ICD-530.81) She would like to use OTC due to cost.  Will try prilosec OTC 2 caps each day.  Her updated medication list for this problem includes:    Prilosec Otc 20 Mg Tbec (Omeprazole magnesium) .Marland KitchenMarland KitchenMarland KitchenMarland Kitchen  1-2 caps by mouth every day   Problem # 5:  ELEVATED BP READING WITHOUT DX HYPERTENSION (ICD-796.2) BP ususally runs 120-130/70-80.  Patient has it checked weekly at work.  Today she has a headache.  will recheck at f/u appt.  Complete Medication List: 1)  Prilosec Otc 20 Mg Tbec (Omeprazole magnesium) .Marland Kitchen.. 1-2 caps by mouth every day 2)  Feosol 45 Mg Tabs (Iron) .... Take 1 tablet by mouth once a day 3)  Antivert  25 Mg Tabs (Meclizine hcl) .Marland Kitchen.. 1 tab every 8 hours as needed dizziness   Patient Instructions: 1)  Please schedule a follow-up appointment in 2 weeks.   Prescriptions: ANTIVERT 25 MG  TABS (MECLIZINE HCL) 1 tab every 8 hours as needed dizziness  #30 x 2   Entered and Authorized by:   Paulo Fruit MD   Signed by:   Paulo Fruit MD on 12/17/2007   Method used:   Electronically sent to ...       [facility] [address]       [address]       [place], [place]  [address]       Ph: [phone]       Fax: [phone]   RxID:   [id number]  ]

## 2010-09-14 NOTE — Assessment & Plan Note (Signed)
[facility]                            CARDIOLOGY OFFICE NOTE   KAELEIGH, WESTENDORF                      MRN:          [id number]  DATE:02/13/2008                            DOB:          April 17, 1981    Ms. Kiara Hall is a very pleasant 30 year old female who I am asked to  evaluate for dizziness and palpitations.  She has no prior cardiac  history.  She apparently has had intermittent palpitations for years.  However, they have increased in frequency recently.  They are is  described as her heart flop.  They are not sustained palpitations.  She has not had associated dyspnea, chest pain or syncope.  However,  these are particularly bothersome to her.  She also recently is being  evaluated for dizziness.  Her dizziness is present essentially at all  times.  It does increase with certain head movements and also with lying  on left side.  She also states that towards the end of walk she notices  it more.  She has not had frank syncope.  She has been seen by  neurologist and apparently an MRI has been normal.  Because of the  above, we were asked to further evaluate.  Note, she is not having  dyspnea on exertion, orthopnea, PND, pedal edema or exertional chest  pain.   MEDICATIONS:  1. Iron.  2. Protonix 40 mg p.o. daily.  3. She also takes Xanax as needed.  4. Aleve.   ALLERGIES:  She has an allergy to FLAGYL.   SOCIAL HISTORY:  She does not smoke.  She rarely consumes alcohol.  She  denies any drug use.   FAMILY HISTORY:  Negative for coronary artery disease in her immediate  family.  She does state that she had an uncle who had a myocardial  infarction in his 41s.   PAST MEDICAL HISTORY:  There is no diabetes mellitus, hypertension or  hyperlipidemia.  She does have a history of gastroesophageal disease.  She has had a previous tonsillectomy.  She has a history of anxiety by  her report.  She also has a history of anemia due to heavy  menstrual  cycles.   REVIEW OF SYSTEMS:  She denies any headaches, fevers or chills.  There  is no productive cough or hemoptysis.  There is no dysphagia,  odynophagia or hematochezia.  Her stools are dark due to iron.  There is  no dysuria or hematuria.  There is no rash or seizure activity.  There  is no orthopnea, PND or pedal edema.  She does have heavy menstrual  cycles.  Remaining systems are negative.   PHYSICAL EXAMINATION:  VITAL SIGNS:  Today, shows a blood pressure of  145/80 and pulse is 84.  She weighs 207 pounds.  GENERAL:  She is well-developed, well-nourished, in no acute distress.  SKIN:  Warm, dry.  She does not depressed.  There is no peripheral  clubbing.  BACK:  Normal.  HEENT:  Normal with normal eyelids.  NECK:  Supple with normal upstroke bilaterally.  There is no bruits  noted.  There  is no jugular distention.  I cannot appreciate  thyromegaly.  CHEST:  Clear to auscultation.  No expansion.  CARDIOVASCULAR:  Regular rhythm.  Normal S1 and S2.  There are no  murmurs, rubs or gallops noted.  There is no change Valsalva.  ABDOMEN:  Nontender, distended.  Positive bowel sounds.  No  hepatosplenomegaly.  No masses appreciated.  There is no abdominal  bruit.  She has 2+ femoral pulses bilaterally.  No bruits.  EXTREMITIES:  No edema that I can palpate.  No cords.  She has 2+  dorsalis pedis pulses bilaterally.  NEUROLOGIC:  Grossly intact.   Her electrocardiogram today shows sinus rhythm at a rate of 83.  The  axis is normal.  There are no significant ST changes.  Note, there is no  delta wave.   DIAGNOSES:  1. Palpitations - These sounds to be most likely premature ventricular      contractions or premature atrial contractions.  They are particular      bothersome to her.  We will schedule her to have an echocardiogram      to quantify her left ventricular function and I will also schedule      her to have a CardioNet monitor.  If they indeed are premature       beats and they become very symptomatic, then we could consider      adding a beta-blocker in the future.  We discussed this in depth      today.  2. Dizziness - Etiology of this is unclear to me, but apparently this      presents essentially all of time.  It does not sound cardiac      related.  We will not pursue this further.  Note, she has had a      neurology evaluation.  I will ask her to follow up with Dr. Andrey Campanile      concerning this issue.  3. Gastroesophageal reflux disease.  4. History of anxiety.   We will see her back in 4-6 weeks to review the above.     Madolyn Frieze Jens Som, MD, [facility]  Electronically Signed    BSC/MedQ  DD: 02/13/2008  DT: 02/13/2008  Job #: [id number]   cc:   Paulo Fruit, MD

## 2010-09-14 NOTE — Consult Note (Signed)
Kiara Hall, Kiara Hall               ACCOUNT NO.:  [id number]   MEDICAL RECORD NO.:  [id number]          PATIENT TYPE:  EMS   LOCATION:  [facility]                         FACILITY:  [facility]   PHYSICIAN:  Deanna Artis. Hickling, M.D.DATE OF BIRTH:  04-15-1981   DATE OF CONSULTATION:  02/01/2008  DATE OF DISCHARGE:  02/01/2008                                 CONSULTATION   CHIEF COMPLAINT:  Left-sided numbness and dizziness.   HISTORY OF THE PRESENT CONDITION:  Kiara Hall is a 30 year old single  right-handed African American woman who presents with a 73-month history  of headache that was bioccipital and switched to the left occipital  region in her head, this was more a feeling of pressure and was not  migrainous in nature.  At that time, she had scalp tenderness.   Recently, she has had feelings of dizziness, which she describes both as  a feeling of unsteadiness and feeling as if she will pass out.  In point  of fact, she has not fainted.  When she gets upright, she feels  lightheaded.  This has been present for 2 months.   Within the past several weeks, the patient has experienced tingling that  spreads from her scalp down into her back, sometimes down into her arm,  fingers, and leg.  This is not a L'hermitte's sign.  The patient fell  downstairs on her buttocks in August 2009, but did not hit her head.  The patient has complained of blurred vision when she looks at her  computer screen.  She has not had diplopia, dysarthria, dysphagia, true  weakness, loss of bowel and bladder control, seizures, and tic disorder.   REVIEW OF SYSTEMS:  The patient goes to bed at 11 o'clock and sleeps  until 7:30 soundly.  Her appetite is good.  She said that she lost 23  pounds but that was when she was working out several times a week, which  she has not been able to do for the last 2 months.  HEAD AND NECK:  The  patient has some tenderness behind the left ear and the mastoid region,  also has a  ringing and popping sound in her left ear.  The ear  examination has been normal.  No pharyngitis or sinusitis.  PULMONARY:  No asthma, bronchitis, or pneumonia.  CARDIOVASCULAR:  The patient has  had some palpitations with normal EKGs.  No chest pain or hypertension.  ABDOMEN:  No nausea, vomiting, diarrhea, distention, or pain.  GENITOURINARY:  No urinary tract infection or hematuria.  MUSCULOSKELETAL:  No fractures, sprains, or deformities.  SKIN:  No rash  or cutaneous lesions.  HEMATOLOGIC:  The patient has iron deficiency  anemia and has been treated with iron.  She has heavy menstrual periods.  No sickle trait lymphadenopathy or easy bruisability.  ENDOCRINE:  No  diabetes or thyroid disease.  The patient has had her thyroid function  checked and has been normal.  NEUROPSYCHIATRIC:  The patient has been  very anxious about her condition and she feels that this is __________  work but she denies that there  is other issues with anxiety.  REPRODUCTIVE:  The patient has not been pregnant.  She has regular  periods.  At one point, she was on oral contraceptives.  She is not at  this time.  NEUROLOGIC:  See above.  The patient also says that she has  mild tremors.  A 12-system review is otherwise negative.   PAST MEDICAL HISTORY:  No serious illnesses, injuries, or  hospitalizations.   PAST SURGICAL HISTORY:  The patient had tonsillectomy as a teenager.  She has not been pregnant.   FAMILY HISTORY:  Negative for symptoms similar to hers and in particular  no seizures, mental retardation, multiple sclerosis, blindness,  deafness, dementia, or other familiar or acquired neurologic disease.   SOCIAL HISTORY:  The patient lives with her boyfriend.  She has regular  sex, often protected with a condom but not rigorously so.  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  The patient does not use  drugs or tobacco.  She occasionally drinks alcohol.   PHYSICAL EXAMINATION:  GENERAL:  This is an attractive overweight obese  pleasant right-handed woman in no acute distress.  VITAL SIGNS:  Temperature 98.1, blood pressure 142/92, resting pulse 92,  respirations 19, and oxygen saturation 98%.  HEAD, EYES, EARS, NOSE AND THROAT:  No signs of infection.  NECK:  Supple, full range of motion.  No craniocervical bruits.  LUNGS:  Clear to auscultation.  HEART:  No murmurs.  Pulse is normal.  ABDOMEN:  Soft, nontender.  Bowel sounds normal.  EXTREMITIES:  Well formed without edema, cyanosis, alterations in tone  or tight heel cords.  SKIN:  No lesions.  VASCULAR:  Tone was normal.  NEUROLOGIC:  Mental Status:  The patient was awake, alert, attentive,  and appropriate.  No dysphasia or dyspraxia.  Names objects, follows  commands, conveys thoughts and feelings.  Cranial nerves:  Round and  reactive pupils.  Normal fundi.  Full visual fields to double  simultaneous stimuli.  Extraocular movements full and conjugate.  Symmetric facial strength and sensation.  Air conduction greater than  bone conduction bilaterally.  Motor examination:  Normal strength, tone,  and mass.  Good fine motor movements.  No pronator drift.  Sensation  intact to cold vibration and stereognosis.  Cerebellar examination:  Good finger-to-nose, rapid alternating movements.  Gait and station were  normal.  She sways a bit with her eyes closed, but did not fall.  She  can get up on her heels and toes.  She has to walk very carefully and in  a tandem, but did not fall away from placing her feet.  Deep tendon  reflexes were symmetric and diminished.  The patient had bilateral  flexor plantar responses.   IMPRESSION:  1. Numbness 782.0.  2. Dizziness 780.4.  3. Headache 784.0.   I find no focal neurologic deficits in this patient.  Nonetheless, the   consolations of symptoms could represent demyelinating disease.  I have  reviewed her CT scan of the brain and it is normal.  We will perform an  MRI scan of the brain without and with contrast and MRI of the cervical  spine without and with contrast to look for demyelinating disease.  If  negative, I will attempt to reassure the patient and then we will plan  to follow her periodically.  If positive, we will have to discuss  treatment at another time during an office visit.  MRI of the head and  cervical spine without and with contrast was normal.   I appreciate the opportunity to participate in her care.       Deanna Artis. Sharene Skeans, M.D.  Electronically Signed     [facility]/MEDQ  D:  02/01/2008  T:  02/02/2008  Job:  [id number]   cc:   Dr. Andrey Campanile

## 2010-12-07 ENCOUNTER — Emergency Department (HOSPITAL_BASED_OUTPATIENT_CLINIC_OR_DEPARTMENT_OTHER)
Admission: EM | Admit: 2010-12-07 | Discharge: 2010-12-07 | Disposition: A | Discharge status: Home / Self Care | Payer: Self-pay | Attending: Emergency Medicine | Admitting: Emergency Medicine

## 2010-12-07 ENCOUNTER — Emergency Department (INDEPENDENT_AMBULATORY_CARE_PROVIDER_SITE_OTHER): Payer: Self-pay

## 2010-12-07 ENCOUNTER — Encounter: Payer: Self-pay | Admitting: *Deleted

## 2010-12-07 DIAGNOSIS — J40 Bronchitis, not specified as acute or chronic: Secondary | ICD-10-CM | POA: Insufficient documentation

## 2010-12-07 DIAGNOSIS — R05 Cough: Secondary | ICD-10-CM

## 2010-12-07 DIAGNOSIS — R059 Cough, unspecified: Secondary | ICD-10-CM

## 2010-12-07 LAB — PREGNANCY, URINE: Preg Test, Ur: NEGATIVE

## 2010-12-07 MED ORDER — AZITHROMYCIN 250 MG PO TABS: 250.0000 mg | ORAL_TABLET | Freq: Every day | ORAL | Status: AC

## 2010-12-07 MED ORDER — DESLORATADINE 5 MG PO TABS: 5.0000 mg | ORAL_TABLET | Freq: Every day | ORAL | Status: DC

## 2010-12-07 MED ORDER — IPRATROPIUM BROMIDE 0.02 % IN SOLN
0.5000 mg | Freq: Once | RESPIRATORY_TRACT | Status: AC
  Administered 2010-12-07: 0.5 mg via RESPIRATORY_TRACT
  Filled 2010-12-07: qty 2.5

## 2010-12-07 MED ORDER — ALBUTEROL SULFATE HFA 108 (90 BASE) MCG/ACT IN AERS: 1.0000 | INHALATION_SPRAY | Freq: Four times a day (QID) | RESPIRATORY_TRACT | Status: DC | PRN

## 2010-12-07 MED ORDER — ALBUTEROL SULFATE (5 MG/ML) 0.5% IN NEBU
5.0000 mg | INHALATION_SOLUTION | Freq: Once | RESPIRATORY_TRACT | Status: AC
  Administered 2010-12-07: 5 mg via RESPIRATORY_TRACT
  Filled 2010-12-07: qty 1

## 2010-12-07 NOTE — ED Notes (Signed)
Pt amb to room 4 with quick steady gait, moist cough noted, pt states she feels hot at times but does not think she is having fevers.  Taking nyquil last dose at 4:30am.  Coughing up green and yellow.

## 2010-12-07 NOTE — ED Provider Notes (Signed)
History     CSN: [id number] Arrival date & time: 12/07/2010  9:39 AM  Chief Complaint  Patient presents with  . Cough   Patient is a 30 y.o. female presenting with cough. The history is provided by the patient. No language interpreter was used.  Cough This is a new problem. The current episode started more than 1 week ago. The problem occurs constantly. The problem has not changed since onset.The cough is productive of purulent sputum. There has been no fever. Associated symptoms include rhinorrhea and wheezing. Pertinent negatives include no chest pain, no chills, no sweats, no weight loss, no ear congestion, no headaches, no sore throat, no shortness of breath and no eye redness. She has tried decongestants and cough syrup for the symptoms. The treatment provided mild relief. Risk factors: nonne. She is not a smoker. Her past medical history does not include bronchitis, pneumonia, bronchiectasis, COPD, emphysema or asthma.    No past medical history on file.  No past surgical history on file.  No family history on file.  History  Substance Use Topics  . Smoking status: Not on file  . Smokeless tobacco: Not on file  . Alcohol Use: Not on file    OB History    No data available      Review of Systems  Constitutional: Negative for chills and weight loss.  HENT: Positive for congestion and rhinorrhea. Negative for sore throat and facial swelling.   Eyes: Negative for discharge and redness.  Respiratory: Positive for cough and wheezing. Negative for shortness of breath.   Cardiovascular: Negative for chest pain.  Gastrointestinal: Negative.   Genitourinary: Negative for difficulty urinating.  Musculoskeletal: Negative.   Skin: Negative.   Neurological: Negative for headaches.  Hematological: Negative.   Psychiatric/Behavioral: Negative.     Physical Exam  BP 139/92  Pulse 97  Temp(Src) 98.1 F (36.7 C) (Oral)  Resp 18  SpO2 100%  Physical Exam  Constitutional: She  is oriented to person, place, and time. She appears well-developed and well-nourished. She appears distressed.  HENT:  Head: Normocephalic and atraumatic.  Mouth/Throat: No oropharyngeal exudate.  Eyes: EOM are normal. Pupils are equal, round, and reactive to light.  Neck: Normal range of motion. Neck supple.  Cardiovascular: Normal rate and regular rhythm.   Pulmonary/Chest: Effort normal. No stridor. She has wheezes.       Decreased movement B  Abdominal: Soft. Bowel sounds are normal.  Musculoskeletal: Normal range of motion.  Lymphadenopathy:    She has no cervical adenopathy.  Neurological: She is alert and oriented to person, place, and time.  Skin: Skin is warm.  Psychiatric: She has a normal mood and affect.    ED Course  Procedures  MDM   Negative PERC score    Return for CP, SOB, DOE, swelling of the lower extremities, fevers chills or any concerns.  Follow up with your family doctor in 2 days  Storm Sovine Smitty Cords, MD 12/07/10 1034

## 2011-01-21 LAB — CBC
HCT: 32.2 — ABNORMAL LOW
Hemoglobin: 10.5 — ABNORMAL LOW
MCHC: 32.7
MCV: 65.6 — ABNORMAL LOW
Platelets: 313
RBC: 4.91
RDW: 21.5 — ABNORMAL HIGH
WBC: 10

## 2011-01-21 LAB — DIFFERENTIAL
Basophils Absolute: 0
Basophils Relative: 0
Eosinophils Absolute: 0
Eosinophils Relative: 0
Lymphocytes Relative: 5 — ABNORMAL LOW
Lymphs Abs: 0.5 — ABNORMAL LOW
Monocytes Absolute: 0.6
Monocytes Relative: 6
Neutro Abs: 8.9 — ABNORMAL HIGH
Neutrophils Relative %: 89 — ABNORMAL HIGH

## 2011-01-21 LAB — COMPREHENSIVE METABOLIC PANEL
ALT: 20
AST: 24
Albumin: 3.8
Alkaline Phosphatase: 58
BUN: 8
CO2: 24
Calcium: 9.2
Chloride: 103
Creatinine, Ser: 0.69
GFR calc Af Amer: >60
GFR calc non Af Amer: >60
Glucose, Bld: 109 — ABNORMAL HIGH
Potassium: 3.9
Sodium: 137
Total Bilirubin: 0.7
Total Protein: 7.5

## 2011-01-21 LAB — URINE MICROSCOPIC-ADD ON

## 2011-01-21 LAB — URINALYSIS, ROUTINE W REFLEX MICROSCOPIC
Bilirubin Urine: NEGATIVE
Glucose, UA: NEGATIVE
Hgb urine dipstick: NEGATIVE
Ketones, ur: NEGATIVE
Nitrite: NEGATIVE
Protein, ur: NEGATIVE
Specific Gravity, Urine: 1.013
Urobilinogen, UA: 0.2
pH: 6

## 2011-01-21 LAB — PREGNANCY, URINE: Preg Test, Ur: NEGATIVE

## 2011-01-21 LAB — LIPASE, BLOOD: Lipase: 20

## 2011-01-25 LAB — BASIC METABOLIC PANEL
BUN: 8
CO2: 25
Calcium: 9.5
Chloride: 107
Creatinine, Ser: 0.9
GFR calc Af Amer: >60
GFR calc non Af Amer: >60
Glucose, Bld: 124 — ABNORMAL HIGH
Potassium: 4.2
Sodium: 139

## 2011-01-25 LAB — CBC
HCT: 34.4 — ABNORMAL LOW
Hemoglobin: 11.2 — ABNORMAL LOW
MCHC: 32.6
MCV: 65 — ABNORMAL LOW
Platelets: 311
RBC: 5.3 — ABNORMAL HIGH
RDW: 21.9 — ABNORMAL HIGH
WBC: 6.5

## 2011-01-25 LAB — URINALYSIS, ROUTINE W REFLEX MICROSCOPIC
Bilirubin Urine: NEGATIVE
Glucose, UA: NEGATIVE
Hgb urine dipstick: NEGATIVE
Ketones, ur: NEGATIVE
Nitrite: NEGATIVE
Protein, ur: NEGATIVE
Specific Gravity, Urine: 1.021
Urobilinogen, UA: 1
pH: 6

## 2011-01-25 LAB — DIFFERENTIAL
Basophils Absolute: 0.1
Basophils Relative: 1
Eosinophils Absolute: 0
Eosinophils Relative: 1
Lymphocytes Relative: 24
Lymphs Abs: 1.6
Monocytes Absolute: 0.6
Monocytes Relative: 10
Neutro Abs: 4.2
Neutrophils Relative %: 65

## 2011-01-25 LAB — PREGNANCY, URINE: Preg Test, Ur: NEGATIVE

## 2011-01-27 LAB — COMPREHENSIVE METABOLIC PANEL
ALT: 20
AST: 21
Albumin: 4
Alkaline Phosphatase: 65
BUN: 6
CO2: 26
Calcium: 9.6
Chloride: 103
Creatinine, Ser: 0.71
GFR calc Af Amer: >60
GFR calc non Af Amer: >60
Glucose, Bld: 112 — ABNORMAL HIGH
Potassium: 3.8
Sodium: 138
Total Bilirubin: 0.8
Total Protein: 7.4

## 2011-01-27 LAB — POCT PREGNANCY, URINE
Operator id: 247071
Preg Test, Ur: NEGATIVE

## 2011-01-27 LAB — POCT URINALYSIS DIP (DEVICE)
Bilirubin Urine: NEGATIVE
Glucose, UA: NEGATIVE
Hgb urine dipstick: NEGATIVE
Nitrite: NEGATIVE
Operator id: 247071
Protein, ur: NEGATIVE
Specific Gravity, Urine: <=1.005
Urobilinogen, UA: 0.2
pH: 5.5

## 2011-01-27 LAB — LIPASE, BLOOD: Lipase: 17

## 2011-01-27 LAB — POCT CARDIAC MARKERS
CKMB, poc: 1.4
CKMB, poc: <1 — ABNORMAL LOW
Myoglobin, poc: 34.8
Myoglobin, poc: 39.4
Operator id: 5506
Operator id: 5506
Troponin i, poc: <0.05
Troponin i, poc: <0.05

## 2011-01-27 LAB — DIFFERENTIAL
Basophils Absolute: 0
Basophils Absolute: 0
Basophils Relative: 0
Basophils Relative: 1
Eosinophils Absolute: 0
Eosinophils Absolute: 0
Eosinophils Relative: 0
Eosinophils Relative: 1
Lymphocytes Relative: 9 — ABNORMAL LOW
Lymphocytes Relative: 34
Lymphs Abs: 0.8
Lymphs Abs: 1.1
Monocytes Absolute: 0.6
Monocytes Absolute: 0.3
Monocytes Relative: 7
Monocytes Relative: 11
Neutro Abs: 7.7
Neutro Abs: 1.7
Neutrophils Relative %: 84 — ABNORMAL HIGH
Neutrophils Relative %: 53

## 2011-01-27 LAB — BASIC METABOLIC PANEL
BUN: 9
BUN: 8
CO2: 26
CO2: 27
Calcium: 9.4
Calcium: 10
Chloride: 103
Chloride: 102
Creatinine, Ser: 0.7
Creatinine, Ser: 0.7
GFR calc Af Amer: >60
GFR calc Af Amer: >60
GFR calc non Af Amer: >60
GFR calc non Af Amer: >60
Glucose, Bld: 105 — ABNORMAL HIGH
Glucose, Bld: 106 — ABNORMAL HIGH
Potassium: 3.5
Potassium: 4.2
Sodium: 139
Sodium: 140

## 2011-01-27 LAB — TSH: TSH: 1.345 (ref 0.350–4.500)

## 2011-01-27 LAB — PREGNANCY, URINE
Preg Test, Ur: NEGATIVE
Preg Test, Ur: NEGATIVE

## 2011-01-27 LAB — CBC
HCT: 32.4 — ABNORMAL LOW
HCT: 34.6 — ABNORMAL LOW
Hemoglobin: 10.4 — ABNORMAL LOW
Hemoglobin: 11.1 — ABNORMAL LOW
MCHC: 32.1
MCHC: 32
MCV: 64.4 — ABNORMAL LOW
MCV: 65.8 — ABNORMAL LOW
Platelets: 263
Platelets: 253
RBC: 5.03
RBC: 5.25 — ABNORMAL HIGH
RDW: 22.8 — ABNORMAL HIGH
RDW: 21.8 — ABNORMAL HIGH
WBC: 9.1
WBC: 3.1 — ABNORMAL LOW

## 2011-01-27 LAB — T3: T3, Total: 139.9 (ref 80.0–204.0)

## 2011-01-27 LAB — D-DIMER, QUANTITATIVE: D-Dimer, Quant: <0.22

## 2011-01-28 LAB — POCT CARDIAC MARKERS
CKMB, poc: 1.2
Myoglobin, poc: 42.4
Operator id: 5513
Troponin i, poc: <0.05

## 2011-01-28 LAB — BASIC METABOLIC PANEL
BUN: 9
CO2: 25
Calcium: 9.6
Chloride: 102
Creatinine, Ser: 0.7
GFR calc Af Amer: >60
GFR calc non Af Amer: >60
Glucose, Bld: 100 — ABNORMAL HIGH
Potassium: 3.7
Sodium: 139

## 2011-01-28 LAB — CBC
HCT: 33.1 — ABNORMAL LOW
Hemoglobin: 10.9 — ABNORMAL LOW
MCHC: 32.8
MCV: 64.3 — ABNORMAL LOW
Platelets: 294
RBC: 5.16 — ABNORMAL HIGH
RDW: 21.2 — ABNORMAL HIGH
WBC: 4.4

## 2011-01-28 LAB — D-DIMER, QUANTITATIVE: D-Dimer, Quant: <0.22

## 2012-01-15 ENCOUNTER — Encounter (HOSPITAL_BASED_OUTPATIENT_CLINIC_OR_DEPARTMENT_OTHER): Payer: Self-pay | Admitting: *Deleted

## 2012-01-15 ENCOUNTER — Emergency Department (HOSPITAL_BASED_OUTPATIENT_CLINIC_OR_DEPARTMENT_OTHER)
Admission: EM | Admit: 2012-01-15 | Discharge: 2012-01-15 | Disposition: A | Discharge status: Home / Self Care | Payer: Federal, State, Local not specified - PPO | Attending: Emergency Medicine | Admitting: Emergency Medicine

## 2012-01-15 DIAGNOSIS — D649 Anemia, unspecified: Secondary | ICD-10-CM | POA: Insufficient documentation

## 2012-01-15 DIAGNOSIS — R002 Palpitations: Secondary | ICD-10-CM | POA: Insufficient documentation

## 2012-01-15 HISTORY — DX: Anemia, unspecified: D64.9

## 2012-01-15 HISTORY — DX: Palpitations: R00.2

## 2012-01-15 LAB — BASIC METABOLIC PANEL
BUN: 13 mg/dL (ref 6–23)
CO2: 24 mEq/L (ref 19–32)
Calcium: 9.7 mg/dL (ref 8.4–10.5)
Chloride: 101 mEq/L (ref 96–112)
Creatinine, Ser: 0.6 mg/dL (ref 0.50–1.10)
GFR calc Af Amer: >90 mL/min (ref >90)
GFR calc non Af Amer: >90 mL/min (ref >90)
Glucose, Bld: 102 mg/dL — ABNORMAL HIGH (ref 70–99)
Potassium: 4.3 mEq/L (ref 3.5–5.1)
Sodium: 136 mEq/L (ref 135–145)

## 2012-01-15 LAB — CBC
HCT: 30.6 % — ABNORMAL LOW (ref 36.0–46.0)
Hemoglobin: 9.7 g/dL — ABNORMAL LOW (ref 12.0–15.0)
MCH: 19.3 pg — ABNORMAL LOW (ref 26.0–34.0)
MCHC: 31.7 g/dL (ref 30.0–36.0)
MCV: 61 fL — ABNORMAL LOW (ref 78.0–100.0)
Platelets: 271 10*3/uL (ref 150–400)
RBC: 5.02 MIL/uL (ref 3.87–5.11)
RDW: 25.1 % — ABNORMAL HIGH (ref 11.5–15.5)
WBC: 5.6 10*3/uL (ref 4.0–10.5)

## 2012-01-15 NOTE — ED Notes (Signed)
Urine sample given

## 2012-01-15 NOTE — ED Notes (Addendum)
Pt states she has a hx of low FE and has palpitations as a result. Has been having them off and on x 2 days. Took some Iron, but doesn't take it as rx'd. Tearful and anxious at triage. Denies other s/s. Wants to be checked for a sinus infection as well.

## 2012-01-15 NOTE — ED Provider Notes (Signed)
History   This chart was scribed for Kiara Chad, MD by Kiara Hall. The patient was seen in room MH06/MH06 and the patient's care was started at 8:48PM    CSN: [id number]  Arrival date & time 01/15/12  1918   First MD Initiated Contact with Patient 01/15/12 2048      Chief Complaint  Patient presents with  . Palpitations    (Consider location/radiation/quality/duration/timing/severity/associated sxs/prior treatment) Patient is a 31 y.o. female presenting with palpitations. The history is provided by the patient. No language interpreter was used.  Palpitations  This is a new problem. The current episode started 2 days ago. The problem occurs daily. The problem has been gradually worsening. The problem is associated with an unknown factor. Pertinent negatives include no fever, no chest pain, no chest pressure, no headaches, no dizziness, no weakness, no cough and no shortness of breath. She has tried bed rest for the symptoms. The treatment provided mild relief. Her past medical history is significant for anemia.    Kiara Hall is a 31 y.o. female , with a hx of anemia, who presents to the Emergency Department complaining of sudden, progressively worsening, palpitations, onset two days ago The pt reports she is experiencing palpitations which feel as if her heart is thumping and fluttering intermittently. In addition, the pt informs that she has not been taking her iron pills on a normal schedule. The pt reports that she has recently lost 50 lbs from undertaking a new herbal supplement diet in addition to participation in an exercise regemin. Modifying factors include lying down which intensifies the palpitations. The pt has a hx of similar palpitations which usually occur one week before she begins her menstrual cycle.  The pt denies blood transfusion or any hospitalization in the past.   The pt does not smoke, however, she does drink alcohol on occasion.   Pt does not have a PCP.     Past Medical History  Diagnosis Date  . Palpitation   . Anemia     History reviewed. No pertinent past surgical history.  History reviewed. No pertinent family history.  History  Substance Use Topics  . Smoking status: Never Smoker   . Smokeless tobacco: Not on file  . Alcohol Use: Yes    OB History    Grav Para Term Preterm Abortions TAB SAB Ect Mult Living                  Review of Systems  Constitutional: Negative for fever and appetite change.  HENT: Negative.   Eyes: Negative.   Respiratory: Negative for cough, chest tightness and shortness of breath.   Cardiovascular: Positive for palpitations. Negative for chest pain and leg swelling.  Genitourinary: Negative.   Musculoskeletal: Negative.   Neurological: Negative for dizziness, syncope, weakness and headaches.  Hematological: Negative.   Psychiatric/Behavioral: Negative.   All other systems reviewed and are negative.    Allergies  Metronidazole  Home Medications   Current Outpatient Rx  Name Route Sig Dispense Refill  . ASPIRIN 81 MG PO TABS Oral Take 81 mg by mouth daily.    Marland Kitchen FERROUS SULFATE 325 (65 FE) MG PO TABS Oral Take 325 mg by mouth daily with breakfast.    . NAPROXEN SODIUM 220 MG PO TABS Oral Take 220 mg by mouth daily as needed. For pain.    . ALBUTEROL SULFATE HFA 108 (90 BASE) MCG/ACT IN AERS Inhalation Inhale 1-2 puffs into the lungs every 6 (six) hours  as needed for wheezing. 1 Inhaler 0  . DESLORATADINE 5 MG PO TABS Oral Take 1 tablet (5 mg total) by mouth daily. 7 tablet 0    BP 137/68  Pulse 92  Temp 98.7 F (37.1 C) (Oral)  Resp 18  Ht 5\' 1"  (1.549 m)  Wt 165 lb (74.844 kg)  BMI 31.18 kg/m2  SpO2 100%  LMP 12/15/2011  Physical Exam  Nursing note and vitals reviewed. Constitutional: She is oriented to person, place, and time. She appears well-developed and well-nourished. No distress.  HENT:  Head: Normocephalic and atraumatic.  Right Ear: External ear normal.  Left  Ear: External ear normal.  Nose: Nose normal.  Mouth/Throat: Oropharynx is clear and moist. No oropharyngeal exudate.  Eyes: Conjunctivae normal are normal. Pupils are equal, round, and reactive to light. Right eye exhibits no discharge. Left eye exhibits no discharge. No scleral icterus.  Neck: Normal range of motion. Neck supple. No JVD present. No tracheal deviation present. No thyromegaly present.  Cardiovascular: Normal rate, regular rhythm, normal heart sounds and intact distal pulses.  Exam reveals no gallop and no friction rub.   No murmur heard. Pulmonary/Chest: Effort normal and breath sounds normal. No stridor. No respiratory distress. She has no wheezes.  Abdominal: Soft. Bowel sounds are normal. She exhibits no distension and no mass. There is no tenderness. There is no rebound and no guarding.  Musculoskeletal: Normal range of motion. She exhibits no edema and no tenderness.  Lymphadenopathy:    She has no cervical adenopathy.  Neurological: She is alert and oriented to person, place, and time.  Skin: Skin is warm and dry. No rash noted. She is not diaphoretic. No erythema. No pallor.  Psychiatric: She has a normal mood and affect. Her behavior is normal.    ED Course  Procedures (including critical care time)  DIAGNOSTIC STUDIES: Oxygen Saturation is 100% on room air, normal by my interpretation.    COORDINATION OF CARE:    9:50PM- EKG results and blood work discussed. Pt agrees to treatment.    Results for orders placed during the hospital encounter of 01/15/12  BASIC METABOLIC PANEL      Component Value Range   Sodium 136  135 - 145 mEq/L   Potassium 4.3  3.5 - 5.1 mEq/L   Chloride 101  96 - 112 mEq/L   CO2 24  19 - 32 mEq/L   Glucose, Bld 102 (*) 70 - 99 mg/dL   BUN 13  6 - 23 mg/dL   Creatinine, Ser 1.61  0.50 - 1.10 mg/dL   Calcium 9.7  8.4 - 09.6 mg/dL   GFR calc non Af Amer >90  >90 mL/min   GFR calc Af Amer >90  >90 mL/min  CBC      Component Value  Range   WBC 5.6  4.0 - 10.5 K/uL   RBC 5.02  3.87 - 5.11 MIL/uL   Hemoglobin 9.7 (*) 12.0 - 15.0 g/dL   HCT 04.5 (*) 40.9 - 81.1 %   MCV 61.0 (*) 78.0 - 100.0 fL   MCH 19.3 (*) 26.0 - 34.0 pg   MCHC 31.7  30.0 - 36.0 g/dL   RDW 91.4 (*) 78.2 - 95.6 %   Platelets 271  150 - 400 K/uL     No results found.   No diagnosis found.   Date: 01/15/2012  Rate: 99 bpm  Rhythm: normal sinus rhythm  QRS Axis: normal  Intervals: normal  ST/T Wave abnormalities: normal  Conduction Disutrbances:none  Narrative Interpretation:   Old EKG Reviewed: unchanged      MDM  Pt presents for evaluation of recurrent palpitations.  She denies any chest pain, SOB, leg pain, stimulant or caffeine use.  No evidence of acute ischemia noted on EKG.  Plan check CBC and BMP.  If no acute abnormalities noted, will refer to a local cardiologist for placement of a holter/event monitor.  She has had multiple similar episodes in the past.  2300.  Pt stable, NAD.  No dysrhythmias noted while under ER care.  Plan refer for holter/event monitor.  Discussed importance of f/u with a PMD as well as indications for return to the emergency department.  Anemia is not significantly different than what has been noted on previous labs.    I personally performed the services described in this documentation, which was scribed in my presence. The recorded information has been reviewed and considered.      Kiara Chad, MD 01/15/12 [phone]

## 2012-05-03 ENCOUNTER — Emergency Department (HOSPITAL_BASED_OUTPATIENT_CLINIC_OR_DEPARTMENT_OTHER)
Admission: EM | Admit: 2012-05-03 | Discharge: 2012-05-03 | Disposition: A | Discharge status: Home / Self Care | Payer: Federal, State, Local not specified - PPO | Attending: Emergency Medicine | Admitting: Emergency Medicine

## 2012-05-03 ENCOUNTER — Encounter (HOSPITAL_BASED_OUTPATIENT_CLINIC_OR_DEPARTMENT_OTHER): Payer: Self-pay | Admitting: *Deleted

## 2012-05-03 ENCOUNTER — Emergency Department (HOSPITAL_BASED_OUTPATIENT_CLINIC_OR_DEPARTMENT_OTHER): Payer: Federal, State, Local not specified - PPO

## 2012-05-03 DIAGNOSIS — Z7982 Long term (current) use of aspirin: Secondary | ICD-10-CM | POA: Insufficient documentation

## 2012-05-03 DIAGNOSIS — R059 Cough, unspecified: Secondary | ICD-10-CM | POA: Insufficient documentation

## 2012-05-03 DIAGNOSIS — J111 Influenza due to unidentified influenza virus with other respiratory manifestations: Secondary | ICD-10-CM

## 2012-05-03 DIAGNOSIS — Z79899 Other long term (current) drug therapy: Secondary | ICD-10-CM | POA: Insufficient documentation

## 2012-05-03 DIAGNOSIS — J3489 Other specified disorders of nose and nasal sinuses: Secondary | ICD-10-CM | POA: Insufficient documentation

## 2012-05-03 DIAGNOSIS — D649 Anemia, unspecified: Secondary | ICD-10-CM | POA: Insufficient documentation

## 2012-05-03 DIAGNOSIS — R002 Palpitations: Secondary | ICD-10-CM | POA: Insufficient documentation

## 2012-05-03 DIAGNOSIS — R05 Cough: Secondary | ICD-10-CM | POA: Insufficient documentation

## 2012-05-03 DIAGNOSIS — R509 Fever, unspecified: Secondary | ICD-10-CM | POA: Insufficient documentation

## 2012-05-03 DIAGNOSIS — Z791 Long term (current) use of non-steroidal anti-inflammatories (NSAID): Secondary | ICD-10-CM | POA: Insufficient documentation

## 2012-05-03 DIAGNOSIS — R0602 Shortness of breath: Secondary | ICD-10-CM | POA: Insufficient documentation

## 2012-05-03 NOTE — ED Provider Notes (Signed)
History     CSN: [id number]  Arrival date & time 05/03/12  0915   First MD Initiated Contact with Patient 05/03/12 [phone]      Chief Complaint  Patient presents with  . Cough    (Consider location/radiation/quality/duration/timing/severity/associated sxs/prior treatment) HPI Patient complaining of cough began 8 days ago.  F/b nasal congestion , fever subjective, chills, sob, cough productive of yellow sputum.  Sick contacts at home.  Patient taking nyquil and dayquil without relief.  No nausea, vomiting, diarrhea.  Patient taking po well.  Patient states hemoglobin is low from heavy menses and takes iron. Primary doctor is Dr. Lovell Sheehan.  Nasal congestion, no sore throat, no neck pain.    Past Medical History  Diagnosis Date  . Palpitation   . Anemia     Past Surgical History  Procedure Date  . Tonsillectomy     No family history on file.  History  Substance Use Topics  . Smoking status: Never Smoker   . Smokeless tobacco: Not on file  . Alcohol Use: Yes    OB History    Grav Para Term Preterm Abortions TAB SAB Ect Mult Living                  Review of Systems  All other systems reviewed and are negative.    Allergies  Metronidazole  Home Medications   Current Outpatient Rx  Name  Route  Sig  Dispense  Refill  . ALBUTEROL SULFATE HFA 108 (90 BASE) MCG/ACT IN AERS   Inhalation   Inhale 1-2 puffs into the lungs every 6 (six) hours as needed for wheezing.   1 Inhaler   0   . ASPIRIN 81 MG PO TABS   Oral   Take 81 mg by mouth daily.         . DESLORATADINE 5 MG PO TABS   Oral   Take 1 tablet (5 mg total) by mouth daily.   7 tablet   0   . FERROUS SULFATE 325 (65 FE) MG PO TABS   Oral   Take 325 mg by mouth daily with breakfast.         . NAPROXEN SODIUM 220 MG PO TABS   Oral   Take 220 mg by mouth daily as needed. For pain.           BP 126/87  Pulse 87  Temp 98.8 F (37.1 C) (Oral)  Resp 20  SpO2 100%  LMP 04/10/2012  Physical  Exam  Nursing note and vitals reviewed. Constitutional: She is oriented to person, place, and time. She appears well-developed and well-nourished.  HENT:  Head: Normocephalic and atraumatic.  Eyes: Conjunctivae normal and EOM are normal. Pupils are equal, round, and reactive to light.  Neck: Normal range of motion. Neck supple.  Cardiovascular: Normal rate, regular rhythm, normal heart sounds and intact distal pulses.   Pulmonary/Chest: Effort normal and breath sounds normal.  Abdominal: Soft. Bowel sounds are normal.  Musculoskeletal: Normal range of motion.  Neurological: She is alert and oriented to person, place, and time. She has normal reflexes.  Skin: Skin is warm.  Psychiatric: She has a normal mood and affect. Her behavior is normal. Judgment and thought content normal.    ED Course  Procedures (including critical care time)  Labs Reviewed - No data to display No results found.   No diagnosis found.    MDM  Patient with influenza-like illness for 8 days. She has continued to cough.  Her chest x-Yael Coppess is clear and shows no evidence of infiltrate. She is advised regarding symptomatic treatment.       Hilario Quarry, MD 05/03/12 1017

## 2012-05-03 NOTE — ED Notes (Signed)
Patient states she developed a non productive cough approximately 8 days ago, now has sinus congestion, sob, productive cough with yellow secretions, generalized body aches and decreased energy.  Using nyquil, last dose at 3:30 am today.

## 2012-11-30 ENCOUNTER — Emergency Department (HOSPITAL_BASED_OUTPATIENT_CLINIC_OR_DEPARTMENT_OTHER)
Admission: EM | Admit: 2012-11-30 | Discharge: 2012-11-30 | Disposition: A | Discharge status: Home / Self Care | Payer: Federal, State, Local not specified - PPO | Attending: Emergency Medicine | Admitting: Emergency Medicine

## 2012-11-30 ENCOUNTER — Encounter (HOSPITAL_BASED_OUTPATIENT_CLINIC_OR_DEPARTMENT_OTHER): Payer: Self-pay | Admitting: *Deleted

## 2012-11-30 DIAGNOSIS — Z8679 Personal history of other diseases of the circulatory system: Secondary | ICD-10-CM | POA: Insufficient documentation

## 2012-11-30 DIAGNOSIS — R110 Nausea: Secondary | ICD-10-CM | POA: Insufficient documentation

## 2012-11-30 DIAGNOSIS — R5381 Other malaise: Secondary | ICD-10-CM | POA: Insufficient documentation

## 2012-11-30 DIAGNOSIS — R102 Pelvic and perineal pain: Secondary | ICD-10-CM

## 2012-11-30 DIAGNOSIS — Z7982 Long term (current) use of aspirin: Secondary | ICD-10-CM | POA: Insufficient documentation

## 2012-11-30 DIAGNOSIS — N949 Unspecified condition associated with female genital organs and menstrual cycle: Secondary | ICD-10-CM | POA: Insufficient documentation

## 2012-11-30 DIAGNOSIS — D649 Anemia, unspecified: Secondary | ICD-10-CM | POA: Insufficient documentation

## 2012-11-30 DIAGNOSIS — R5383 Other fatigue: Secondary | ICD-10-CM | POA: Insufficient documentation

## 2012-11-30 DIAGNOSIS — Z3202 Encounter for pregnancy test, result negative: Secondary | ICD-10-CM | POA: Insufficient documentation

## 2012-11-30 DIAGNOSIS — Z79899 Other long term (current) drug therapy: Secondary | ICD-10-CM | POA: Insufficient documentation

## 2012-11-30 LAB — URINALYSIS, ROUTINE W REFLEX MICROSCOPIC
Bilirubin Urine: NEGATIVE
Glucose, UA: NEGATIVE mg/dL
Hgb urine dipstick: NEGATIVE
Ketones, ur: NEGATIVE mg/dL
Leukocytes, UA: NEGATIVE
Nitrite: NEGATIVE
Protein, ur: NEGATIVE mg/dL
Specific Gravity, Urine: 1.007 (ref 1.005–1.030)
Urobilinogen, UA: 0.2 mg/dL (ref 0.0–1.0)
pH: 7.5 (ref 5.0–8.0)

## 2012-11-30 LAB — WET PREP, GENITAL
Trich, Wet Prep: NONE SEEN
Yeast Wet Prep HPF POC: NONE SEEN

## 2012-11-30 LAB — PREGNANCY, URINE: Preg Test, Ur: NEGATIVE

## 2012-11-30 NOTE — ED Notes (Signed)
Pt amb to triage with quick steady gait in nad.  Pt states that she had a 3 day period this week, and she usually goes for 6 days. Pt states she has had pelvic pain off and on x Friday. Pt states she googled her symptoms and thinks she could have a tubal pregnancy.

## 2012-11-30 NOTE — ED Provider Notes (Signed)
CSN: [id number]     Arrival date & time 11/30/12  0804 History     First MD Initiated Contact with Patient 11/30/12 [phone]     Chief Complaint  Patient presents with  . Pelvic Pain   (Consider location/radiation/quality/duration/timing/severity/associated sxs/prior Treatment) HPI Comments: Patient presents with a three-day history of intermittent pains in her pelvic area. She states at times on the right side and at times on the left side. She is a sharp intermittent pain it just lasts a few seconds. She currently denies any pain. She's had some generalized fatigue. She's had some mild nausea but no vomiting. She had a three-day period this month which started on July 23. She states her periods usually last for 60. She denies any vaginal discharge. She denies any past history of pregnancy. She denies any urinary symptoms.  Patient is a 32 y.o. female presenting with pelvic pain.  Pelvic Pain Pertinent negatives include no chest pain, no abdominal pain, no headaches and no shortness of breath.    Past Medical History  Diagnosis Date  . Palpitation   . Anemia    Past Surgical History  Procedure Laterality Date  . Tonsillectomy     History reviewed. No pertinent family history. History  Substance Use Topics  . Smoking status: Never Smoker   . Smokeless tobacco: Not on file  . Alcohol Use: Yes   OB History   Grav Para Term Preterm Abortions TAB SAB Ect Mult Living                 Review of Systems  Constitutional: Negative for fever, chills, diaphoresis and fatigue.  HENT: Negative for congestion, rhinorrhea and sneezing.   Eyes: Negative.   Respiratory: Negative for cough, chest tightness and shortness of breath.   Cardiovascular: Negative for chest pain and leg swelling.  Gastrointestinal: Negative for nausea, vomiting, abdominal pain, diarrhea and blood in stool.  Genitourinary: Positive for pelvic pain. Negative for frequency, hematuria, flank pain, vaginal bleeding,  vaginal discharge and difficulty urinating.  Musculoskeletal: Negative for back pain and arthralgias.  Skin: Negative for rash.  Neurological: Negative for dizziness, speech difficulty, weakness, numbness and headaches.    Allergies  Metronidazole  Home Medications   Current Outpatient Rx  Name  Route  Sig  Dispense  Refill  . EXPIRED: albuterol (PROVENTIL HFA;VENTOLIN HFA) 108 (90 BASE) MCG/ACT inhaler   Inhalation   Inhale 1-2 puffs into the lungs every 6 (six) hours as needed for wheezing.   1 Inhaler   0   . aspirin 81 MG tablet   Oral   Take 81 mg by mouth daily.         Marland Kitchen EXPIRED: desloratadine (CLARINEX) 5 MG tablet   Oral   Take 1 tablet (5 mg total) by mouth daily.   7 tablet   0   . ferrous sulfate 325 (65 FE) MG tablet   Oral   Take 325 mg by mouth daily with breakfast.         . naproxen sodium (ANAPROX) 220 MG tablet   Oral   Take 220 mg by mouth daily as needed. For pain.          BP 140/88  Pulse 89  Temp(Src) 98.2 F (36.8 C) (Oral)  Resp 18  SpO2 99%  LMP 11/19/2012 Physical Exam  Constitutional: She is oriented to person, place, and time. She appears well-developed and well-nourished.  HENT:  Head: Normocephalic and atraumatic.  Eyes: Pupils are equal, round,  and reactive to light.  Neck: Normal range of motion. Neck supple.  Cardiovascular: Normal rate, regular rhythm and normal heart sounds.   Pulmonary/Chest: Effort normal and breath sounds normal. No respiratory distress. She has no wheezes. She has no rales. She exhibits no tenderness.  Abdominal: Soft. Bowel sounds are normal. There is no tenderness. There is no rebound and no guarding.  Genitourinary: Vagina normal. No vaginal discharge found.  No cervical motion tenderness or adnexal tenderness  Musculoskeletal: Normal range of motion. She exhibits no edema.  Lymphadenopathy:    She has no cervical adenopathy.  Neurological: She is alert and oriented to person, place, and  time.  Skin: Skin is warm and dry. No rash noted.  Psychiatric: She has a normal mood and affect.    ED Course   Procedures (including critical care time)  Results for orders placed during the hospital encounter of 11/30/12  WET PREP, GENITAL      Result Value Range   Yeast Wet Prep HPF POC NONE SEEN  NONE SEEN   Trich, Wet Prep NONE SEEN  NONE SEEN   Clue Cells Wet Prep HPF POC FEW (*) NONE SEEN   WBC, Wet Prep HPF POC FEW (*) NONE SEEN  URINALYSIS, ROUTINE W REFLEX MICROSCOPIC      Result Value Range   Color, Urine YELLOW  YELLOW   APPearance CLEAR  CLEAR   Specific Gravity, Urine 1.007  1.005 - 1.030   pH 7.5  5.0 - 8.0   Glucose, UA NEGATIVE  NEGATIVE mg/dL   Hgb urine dipstick NEGATIVE  NEGATIVE   Bilirubin Urine NEGATIVE  NEGATIVE   Ketones, ur NEGATIVE  NEGATIVE mg/dL   Protein, ur NEGATIVE  NEGATIVE mg/dL   Urobilinogen, UA 0.2  0.0 - 1.0 mg/dL   Nitrite NEGATIVE  NEGATIVE   Leukocytes, UA NEGATIVE  NEGATIVE  PREGNANCY, URINE      Result Value Range   Preg Test, Ur NEGATIVE  NEGATIVE   No results found.   No results found. 1. Pelvic pain     MDM  Patient is sharp intermittent pains to her pelvic area. She currently does not have any tenderness. There is no suggestion 3 times a day. She did have a slight amount of clue cells but she had. Will discharge on exam in her symptoms do not appear to be consistent with clinical bacterial vaginosis. Given that she's allergic to Flagyl I will hold off on treatment have her followup for reexam with her OB/GYN. Her symptoms at this point do not appear to be consistent with an ectopic pregnancy. Her pregnancy test was negative. She has no signs of ovarian torsion. She was discharged home in good condition and I encouraged her to followup with her OB/GYN.  Rolan Bucco, MD 11/30/12 [phone]

## 2012-11-30 NOTE — ED Notes (Signed)
Pelvic cart is set up and at the bedside, ready for the doctor to use.

## 2012-12-01 LAB — GC/CHLAMYDIA PROBE AMP
CT Probe RNA: NEGATIVE
GC Probe RNA: NEGATIVE

## 2013-04-09 ENCOUNTER — Other Ambulatory Visit (HOSPITAL_COMMUNITY)
Admission: RE | Admit: 2013-04-09 | Discharge: 2013-04-09 | Disposition: A | Discharge status: Home / Self Care | Payer: Federal, State, Local not specified - PPO | Source: Ambulatory Visit | Attending: Obstetrics and Gynecology | Admitting: Obstetrics and Gynecology

## 2013-04-09 ENCOUNTER — Other Ambulatory Visit: Payer: Self-pay | Admitting: Obstetrics and Gynecology

## 2013-04-09 DIAGNOSIS — Z1151 Encounter for screening for human papillomavirus (HPV): Secondary | ICD-10-CM | POA: Insufficient documentation

## 2013-04-09 DIAGNOSIS — Z124 Encounter for screening for malignant neoplasm of cervix: Secondary | ICD-10-CM | POA: Insufficient documentation

## 2013-04-09 DIAGNOSIS — R87810 Cervical high risk human papillomavirus (HPV) DNA test positive: Secondary | ICD-10-CM | POA: Insufficient documentation

## 2013-05-21 ENCOUNTER — Other Ambulatory Visit: Payer: Self-pay | Admitting: Obstetrics and Gynecology

## 2013-07-30 ENCOUNTER — Emergency Department (HOSPITAL_BASED_OUTPATIENT_CLINIC_OR_DEPARTMENT_OTHER)
Admission: EM | Admit: 2013-07-30 | Discharge: 2013-07-30 | Disposition: A | Discharge status: Home / Self Care | Payer: Federal, State, Local not specified - PPO | Attending: Emergency Medicine | Admitting: Emergency Medicine

## 2013-07-30 ENCOUNTER — Emergency Department (HOSPITAL_BASED_OUTPATIENT_CLINIC_OR_DEPARTMENT_OTHER): Payer: Federal, State, Local not specified - PPO

## 2013-07-30 ENCOUNTER — Encounter (HOSPITAL_BASED_OUTPATIENT_CLINIC_OR_DEPARTMENT_OTHER): Payer: Self-pay | Admitting: Emergency Medicine

## 2013-07-30 DIAGNOSIS — Z7982 Long term (current) use of aspirin: Secondary | ICD-10-CM | POA: Insufficient documentation

## 2013-07-30 DIAGNOSIS — R42 Dizziness and giddiness: Secondary | ICD-10-CM | POA: Insufficient documentation

## 2013-07-30 DIAGNOSIS — Z862 Personal history of diseases of the blood and blood-forming organs and certain disorders involving the immune mechanism: Secondary | ICD-10-CM | POA: Insufficient documentation

## 2013-07-30 DIAGNOSIS — R079 Chest pain, unspecified: Secondary | ICD-10-CM

## 2013-07-30 DIAGNOSIS — Z8659 Personal history of other mental and behavioral disorders: Secondary | ICD-10-CM | POA: Insufficient documentation

## 2013-07-30 DIAGNOSIS — Z79899 Other long term (current) drug therapy: Secondary | ICD-10-CM | POA: Insufficient documentation

## 2013-07-30 DIAGNOSIS — R002 Palpitations: Secondary | ICD-10-CM

## 2013-07-30 LAB — URINALYSIS, ROUTINE W REFLEX MICROSCOPIC
Bilirubin Urine: NEGATIVE
Glucose, UA: NEGATIVE mg/dL
Hgb urine dipstick: NEGATIVE
Ketones, ur: NEGATIVE mg/dL
Leukocytes, UA: NEGATIVE
Nitrite: NEGATIVE
Protein, ur: NEGATIVE mg/dL
Specific Gravity, Urine: 1.002 — ABNORMAL LOW (ref 1.005–1.030)
Urobilinogen, UA: 0.2 mg/dL (ref 0.0–1.0)
pH: 6 (ref 5.0–8.0)

## 2013-07-30 LAB — CBC
HCT: 33.4 % — ABNORMAL LOW (ref 36.0–46.0)
Hemoglobin: 10.8 g/dL — ABNORMAL LOW (ref 12.0–15.0)
MCH: 22.8 pg — ABNORMAL LOW (ref 26.0–34.0)
MCHC: 32.3 g/dL (ref 30.0–36.0)
MCV: 70.5 fL — ABNORMAL LOW (ref 78.0–100.0)
Platelets: 214 10*3/uL (ref 150–400)
RBC: 4.74 MIL/uL (ref 3.87–5.11)
RDW: 22.4 % — ABNORMAL HIGH (ref 11.5–15.5)
WBC: 3.9 10*3/uL — ABNORMAL LOW (ref 4.0–10.5)

## 2013-07-30 LAB — TROPONIN I: Troponin I: <0.3 ng/mL (ref <0.30)

## 2013-07-30 LAB — CBG MONITORING, ED: Glucose-Capillary: 116 mg/dL — ABNORMAL HIGH (ref 70–99)

## 2013-07-30 LAB — PREGNANCY, URINE: Preg Test, Ur: NEGATIVE

## 2013-07-30 LAB — BASIC METABOLIC PANEL
BUN: 7 mg/dL (ref 6–23)
CO2: 23 mEq/L (ref 19–32)
Calcium: 9 mg/dL (ref 8.4–10.5)
Chloride: 103 mEq/L (ref 96–112)
Creatinine, Ser: 0.7 mg/dL (ref 0.50–1.10)
GFR calc Af Amer: >90 mL/min (ref >90)
GFR calc non Af Amer: >90 mL/min (ref >90)
Glucose, Bld: 92 mg/dL (ref 70–99)
Potassium: 3.7 mEq/L (ref 3.7–5.3)
Sodium: 140 mEq/L (ref 137–147)

## 2013-07-30 LAB — MAGNESIUM: Magnesium: 1.8 mg/dL (ref 1.5–2.5)

## 2013-07-30 MED ORDER — SODIUM CHLORIDE 0.9 % IV BOLUS (SEPSIS)
1000.0000 mL | Freq: Once | INTRAVENOUS | Status: AC
  Administered 2013-07-30: 1000 mL via INTRAVENOUS

## 2013-07-30 MED ORDER — OMEPRAZOLE 20 MG PO CPDR: 20.0000 mg | DELAYED_RELEASE_CAPSULE | Freq: Every day | ORAL | Status: DC

## 2013-07-30 NOTE — ED Provider Notes (Signed)
TIME SEEN: 5:41 PM  CHIEF COMPLAINT: Weakness, chest pain  HPI: Patient is a 33 year old female with a history of anemia and anxiety who presents to the emergency department with complaints of intermittent episodes of burning, dull left-sided chest pain and a feeling of weakness and fatigue that happened today while at work. She states she's also had intermittent dizziness. She's been seen by her primary care physician and states she is concerned because no blood work has been drawn yet. She has had an x-ray of her neck and her shoulder. She denies a history of cardiac disease, prior PE or DVT, recent prolonged immobilization or fracture or surgery or trauma or hospitalization or a long flight. She is not on exogenous estrogen. She does not smoke. She does not have a family history of premature CAD. No fevers. No vomiting or diarrhea. No bloody stools or melena. Her last menstrual period was 3 weeks ago.  ROS: See HPI Constitutional: no fever  Eyes: no drainage  ENT: no runny nose   Cardiovascular:  no chest pain  Resp: no SOB  GI: no vomiting GU: no dysuria Integumentary: no rash  Allergy: no hives  Musculoskeletal: no leg swelling  Neurological: no slurred speech ROS otherwise negative  PAST MEDICAL HISTORY/PAST SURGICAL HISTORY:  Past Medical History  Diagnosis Date  . Palpitation   . Anemia     MEDICATIONS:  Prior to Admission medications   Medication Sig Start Date End Date Taking? Authorizing Provider  ibuprofen (ADVIL,MOTRIN) 800 MG tablet Take 800 mg by mouth every 8 (eight) hours as needed.   Yes Historical Provider, MD  methocarbamol (ROBAXIN) 750 MG tablet Take 750 mg by mouth 4 (four) times daily.   Yes Historical Provider, MD  albuterol (PROVENTIL HFA;VENTOLIN HFA) 108 (90 BASE) MCG/ACT inhaler Inhale 1-2 puffs into the lungs every 6 (six) hours as needed for wheezing. 12/07/10 12/07/11  April K Palumbo-Rasch, MD  aspirin 81 MG tablet Take 81 mg by mouth daily.    Historical  Provider, MD  desloratadine (CLARINEX) 5 MG tablet Take 1 tablet (5 mg total) by mouth daily. 12/07/10 12/07/11  April K Palumbo-Rasch, MD  ferrous sulfate 325 (65 FE) MG tablet Take 325 mg by mouth daily with breakfast.    Historical Provider, MD  naproxen sodium (ANAPROX) 220 MG tablet Take 220 mg by mouth daily as needed. For pain.    Historical Provider, MD    ALLERGIES:  Allergies  Allergen Reactions  . Metronidazole Hives and Shortness Of Breath    SOCIAL HISTORY:  History  Substance Use Topics  . Smoking status: Never Smoker   . Smokeless tobacco: Not on file  . Alcohol Use: Yes    FAMILY HISTORY: No family history on file.  EXAM: BP 137/80  Pulse 91  Temp(Src) 98.7 F (37.1 C) (Oral)  Resp 20  Ht 5\' 1"  (1.549 m)  Wt 168 lb (76.204 kg)  BMI 31.76 kg/m2  SpO2 100%  LMP 07/02/2013 CONSTITUTIONAL: Alert and oriented and responds appropriately to questions. Well-appearing; well-nourished HEAD: Normocephalic EYES: Conjunctivae clear, PERRL ENT: normal nose; no rhinorrhea; moist mucous membranes; pharynx without lesions noted NECK: Supple, no meningismus, no LAD  CARD: RRR; S1 and S2 appreciated; no murmurs, no clicks, no rubs, no gallops RESP: Normal chest excursion without splinting or tachypnea; breath sounds clear and equal bilaterally; no wheezes, no rhonchi, no rales, left anterior chest wall is tender to palpation without crepitus or ecchymosis or deformity ABD/GI: Normal bowel sounds; non-distended; soft, non-tender,  no rebound, no guarding BACK:  The back appears normal and is non-tender to palpation, there is no CVA tenderness EXT: Normal ROM in all joints; non-tender to palpation; no edema; normal capillary refill; no cyanosis    SKIN: Normal color for age and race; warm NEURO: Moves all extremities equally, cranial nerves II through XII intact, sensation to light touch intact diffusely, normal gait  PSYCH: The patient's mood and manner are appropriate. Grooming  and personal hygiene are appropriate. Patient appears very anxious.  MEDICAL DECISION MAKING: Patient here with burning, dull chest pain, last episode at 1 AM this morning. No associated shortness of breath, nausea, vomiting, diaphoresis or dizziness. She is PERC negative and as a heart score of 0. Her EKG shows no ischemic changes. Her chest wall is tender to palpation. She states she's had intermittent dizziness it is worse with change of position. We'll check basic labs, urine, urine pregnancy and give IV fluids. We'll also obtain chest x-ray given her chest pain although I suspect this is chest wall pain.  ED PROGRESS: Patient's labs are unremarkable. Her hemoglobin is 10.8. Electrolytes are within normal limits. Troponin negative. Chest x-ray clear. Urine shows no sign of infection. Urine pregnancy test negative. Have discussed with patient that I am not concerned for any life-threatening illness at this time. We'll have her followup with her primary care physician as they may decide to send her to a cardiologist for Holter monitor to monitor her palpitations. Instructed patient to continue drinking plenty of fluids. Have given return precautions. We'll have her hold her ibuprofen at this time and start her on low-dose omeprazole given her chest burning sensation which may be due to indigestion. Patient verbalizes understanding is comfortable with this plan.    EKG Interpretation  Date/Time:  Tuesday July 30 2013 16:36:18 EDT Ventricular Rate:  90 PR Interval:  144 QRS Duration: 90 QT Interval:  358 QTC Calculation: 437 R Axis:   57 Text Interpretation:  Normal sinus rhythm Cannot rule out Anterior infarct , age undetermined Abnormal ECG Confirmed by Caleigh Rabelo,  DO, Alonte Wulff [phone]) on 07/30/2013 4:42:30 PM         Layla Maw Kiernan Farkas, DO 07/30/13 1935

## 2013-07-30 NOTE — ED Notes (Signed)
Weakness. Burning epigastric pain wakes here at 1am. Her MD did an EKG 2 weeks ago and it was ok. She was told it could be anxiety.

## 2013-07-30 NOTE — Discharge Instructions (Signed)
Your labs today were normal including your kidney function electrolytes, blood counts (other than mild anemia with a hemoglobin of 10.8).  Your EKG is normal and we did labs to evaluate your heart which is also normal. Your chest x-ray and urine were also normal. We recommend you follow up with your primary care physician who may refer you to her cardiologist for a Holter monitor given your palpitations.  Chest Pain (Nonspecific) It is often hard to give a specific diagnosis for the cause of chest pain. There is always a chance that your pain could be related to something serious, such as a heart attack or a blood clot in the lungs. You need to follow up with your caregiver for further evaluation. CAUSES   Heartburn.  Pneumonia or bronchitis.  Anxiety or stress.  Inflammation around your heart (pericarditis) or lung (pleuritis or pleurisy).  A blood clot in the lung.  A collapsed lung (pneumothorax). It can develop suddenly on its own (spontaneous pneumothorax) or from injury (trauma) to the chest.  Shingles infection (herpes zoster virus). The chest wall is composed of bones, muscles, and cartilage. Any of these can be the source of the pain.  The bones can be bruised by injury.  The muscles or cartilage can be strained by coughing or overwork.  The cartilage can be affected by inflammation and become sore (costochondritis). DIAGNOSIS  Lab tests or other studies, such as X-rays, electrocardiography, stress testing, or cardiac imaging, may be needed to find the cause of your pain.  TREATMENT   Treatment depends on what may be causing your chest pain. Treatment may include:  Acid blockers for heartburn.  Anti-inflammatory medicine.  Pain medicine for inflammatory conditions.  Antibiotics if an infection is present.  You may be advised to change lifestyle habits. This includes stopping smoking and avoiding alcohol, caffeine, and chocolate.  You may be advised to keep your head  raised (elevated) when sleeping. This reduces the chance of acid going backward from your stomach into your esophagus.  Most of the time, nonspecific chest pain will improve within 2 to 3 days with rest and mild pain medicine. HOME CARE INSTRUCTIONS   If antibiotics were prescribed, take your antibiotics as directed. Finish them even if you start to feel better.  For the next few days, avoid physical activities that bring on chest pain. Continue physical activities as directed.  Do not smoke.  Avoid drinking alcohol.  Only take over-the-counter or prescription medicine for pain, discomfort, or fever as directed by your caregiver.  Follow your caregiver's suggestions for further testing if your chest pain does not go away.  Keep any follow-up appointments you made. If you do not go to an appointment, you could develop lasting (chronic) problems with pain. If there is any problem keeping an appointment, you must call to reschedule. SEEK MEDICAL CARE IF:   You think you are having problems from the medicine you are taking. Read your medicine instructions carefully.  Your chest pain does not go away, even after treatment.  You develop a rash with blisters on your chest. SEEK IMMEDIATE MEDICAL CARE IF:   You have increased chest pain or pain that spreads to your arm, neck, jaw, back, or abdomen.  You develop shortness of breath, an increasing cough, or you are coughing up blood.  You have severe back or abdominal pain, feel nauseous, or vomit.  You develop severe weakness, fainting, or chills.  You have a fever. THIS IS AN EMERGENCY. Do not  wait to see if the pain will go away. Get medical help at once. Call your local emergency services (911 in [place] Do not drive yourself to the hospital. MAKE SURE YOU:   Understand these instructions.  Will watch your condition.  Will get help right away if you are not doing well or get worse. Document Released: 01/26/2005 Document Revised:  07/11/2011 Document Reviewed: 11/22/2007 [facility] Patient Information 2014 [place], [place].  Dizziness Dizziness is a common problem. It is a feeling of unsteadiness or lightheadedness. You may feel like you are about to faint. Dizziness can lead to injury if you stumble or fall. A person of any age group can suffer from dizziness, but dizziness is more common in older adults. CAUSES  Dizziness can be caused by many different things, including:  Middle ear problems.  Standing for too long.  Infections.  An allergic reaction.  Aging.  An emotional response to something, such as the sight of blood.  Side effects of medicines.  Fatigue.  Problems with circulation or blood pressure.  Excess use of alcohol, medicines, or illegal drug use.  Breathing too fast (hyperventilation).  An arrhythmia or problems with your heart rhythm.  Low red blood cell count (anemia).  Pregnancy.  Vomiting, diarrhea, fever, or other illnesses that cause dehydration.  Diseases or conditions such as Parkinson's disease, high blood pressure (hypertension), diabetes, and thyroid problems.  Exposure to extreme heat. DIAGNOSIS  To find the cause of your dizziness, your caregiver may do a physical exam, lab tests, radiologic imaging scans, or an electrocardiography test (ECG).  TREATMENT  Treatment of dizziness depends on the cause of your symptoms and can vary greatly. HOME CARE INSTRUCTIONS   Drink enough fluids to keep your urine clear or pale yellow. This is especially important in very hot weather. In the elderly, it is also important in cold weather.  If your dizziness is caused by medicines, take them exactly as directed. When taking blood pressure medicines, it is especially important to get up slowly.  Rise slowly from chairs and steady yourself until you feel okay.  In the morning, first sit up on the side of the bed. When this seems okay, stand slowly while holding onto something until  you know your balance is fine.  If you need to stand in one place for a long time, be sure to move your legs often. Tighten and relax the muscles in your legs while standing.  If dizziness continues to be a problem, have someone stay with you for a day or two. Do this until you feel you are well enough to stay alone. Have the person call your caregiver if he or she notices changes in you that are concerning.  Do not drive or use heavy machinery if you feel dizzy.  Do not drink alcohol. SEEK IMMEDIATE MEDICAL CARE IF:   Your dizziness or lightheadedness gets worse.  You feel nauseous or vomit.  You develop problems with talking, walking, weakness, or using your arms, hands, or legs.  You are not thinking clearly or you have difficulty forming sentences. It may take a friend or family member to determine if your thinking is normal.  You develop chest pain, abdominal pain, shortness of breath, or sweating.  Your vision changes.  You notice any bleeding.  You have side effects from medicine that seems to be getting worse rather than better. MAKE SURE YOU:   Understand these instructions.  Will watch your condition.  Will get help right away  if you are not doing well or get worse. Document Released: 10/12/2000 Document Revised: 07/11/2011 Document Reviewed: 11/05/2010 [facility] Patient Information 2014 [place], [place].  Palpitations  A palpitation is the feeling that your heartbeat is irregular or is faster than normal. It may feel like your heart is fluttering or skipping a beat. Palpitations are usually not a serious problem. However, in some cases, you may need further medical evaluation. CAUSES  Palpitations can be caused by:  Smoking.  Caffeine or other stimulants, such as diet pills or energy drinks.  Alcohol.  Stress and anxiety.  Strenuous physical activity.  Fatigue.  Certain medicines.  Heart disease, especially if you have a history of arrhythmias. This  includes atrial fibrillation, atrial flutter, or supraventricular tachycardia.  An improperly working pacemaker or defibrillator. DIAGNOSIS  To find the cause of your palpitations, your caregiver will take your history and perform a physical exam. Tests may also be done, including:  Electrocardiography (ECG). This test records the heart's electrical activity.  Cardiac monitoring. This allows your caregiver to monitor your heart rate and rhythm in real time.  Holter monitor. This is a portable device that records your heartbeat and can help diagnose heart arrhythmias. It allows your caregiver to track your heart activity for several days, if needed.  Stress tests by exercise or by giving medicine that makes the heart beat faster. TREATMENT  Treatment of palpitations depends on the cause of your symptoms and can vary greatly. Most cases of palpitations do not require any treatment other than time, relaxation, and monitoring your symptoms. Other causes, such as atrial fibrillation, atrial flutter, or supraventricular tachycardia, usually require further treatment. HOME CARE INSTRUCTIONS   Avoid:  Caffeinated coffee, tea, soft drinks, diet pills, and energy drinks.  Chocolate.  Alcohol.  Stop smoking if you smoke.  Reduce your stress and anxiety. Things that can help you relax include:  A method that measures bodily functions so you can learn to control them (biofeedback).  Yoga.  Meditation.  Physical activity such as swimming, jogging, or walking.  Get plenty of rest and sleep. SEEK MEDICAL CARE IF:   You continue to have a fast or irregular heartbeat beyond 24 hours.  Your palpitations occur more often. SEEK IMMEDIATE MEDICAL CARE IF:  You develop chest pain or shortness of breath.  You have a severe headache.  You feel dizzy, or you faint. MAKE SURE YOU:  Understand these instructions.  Will watch your condition.  Will get help right away if you are not doing  well or get worse. Document Released: 04/15/2000 Document Revised: 08/13/2012 Document Reviewed: 06/17/2011 [facility] Patient Information 2014 [place], [place]. Gastroesophageal Reflux Disease, Adult Gastroesophageal reflux disease (GERD) happens when acid from your stomach flows up into the esophagus. When acid comes in contact with the esophagus, the acid causes soreness (inflammation) in the esophagus. Over time, GERD may create small holes (ulcers) in the lining of the esophagus. CAUSES   Increased body weight. This puts pressure on the stomach, making acid rise from the stomach into the esophagus.  Smoking. This increases acid production in the stomach.  Drinking alcohol. This causes decreased pressure in the lower esophageal sphincter (valve or ring of muscle between the esophagus and stomach), allowing acid from the stomach into the esophagus.  Late evening meals and a full stomach. This increases pressure and acid production in the stomach.  A malformed lower esophageal sphincter. Sometimes, no cause is found. SYMPTOMS   Burning pain in the lower part of the  mid-chest behind the breastbone and in the mid-stomach area. This may occur twice a week or more often.  Trouble swallowing.  Sore throat.  Dry cough.  Asthma-like symptoms including chest tightness, shortness of breath, or wheezing. DIAGNOSIS  Your caregiver may be able to diagnose GERD based on your symptoms. In some cases, X-rays and other tests may be done to check for complications or to check the condition of your stomach and esophagus. TREATMENT  Your caregiver may recommend over-the-counter or prescription medicines to help decrease acid production. Ask your caregiver before starting or adding any new medicines.  HOME CARE INSTRUCTIONS   Change the factors that you can control. Ask your caregiver for guidance concerning weight loss, quitting smoking, and alcohol consumption.  Avoid foods and drinks that make your  symptoms worse, such as:  Caffeine or alcoholic drinks.  Chocolate.  Peppermint or mint flavorings.  Garlic and onions.  Spicy foods.  Citrus fruits, such as oranges, lemons, or limes.  Tomato-based foods such as sauce, chili, salsa, and pizza.  Fried and fatty foods.  Avoid lying down for the 3 hours prior to your bedtime or prior to taking a nap.  Eat small, frequent meals instead of large meals.  Wear loose-fitting clothing. Do not wear anything tight around your waist that causes pressure on your stomach.  Raise the head of your bed 6 to 8 inches with wood blocks to help you sleep. Extra pillows will not help.  Only take over-the-counter or prescription medicines for pain, discomfort, or fever as directed by your caregiver.  Do not take aspirin, ibuprofen, or other nonsteroidal anti-inflammatory drugs (NSAIDs). SEEK IMMEDIATE MEDICAL CARE IF:   You have pain in your arms, neck, jaw, teeth, or back.  Your pain increases or changes in intensity or duration.  You develop nausea, vomiting, or sweating (diaphoresis).  You develop shortness of breath, or you faint.  Your vomit is green, yellow, black, or looks like coffee grounds or blood.  Your stool is red, bloody, or black. These symptoms could be signs of other problems, such as heart disease, gastric bleeding, or esophageal bleeding. MAKE SURE YOU:   Understand these instructions.  Will watch your condition.  Will get help right away if you are not doing well or get worse. Document Released: 01/26/2005 Document Revised: 07/11/2011 Document Reviewed: 11/05/2010 [facility] [place] Patient Information 2014 [place]. Diet for Gastroesophageal Reflux Disease, Adult Reflux (acid reflux) is when acid from your stomach flows up into the esophagus. When acid comes in contact with the esophagus, the acid causes irritation and soreness (inflammation) in the esophagus. When reflux happens often or so severely that it causes  damage to the esophagus, it is called gastroesophageal reflux disease (GERD). Nutrition therapy can help ease the discomfort of GERD. FOODS OR DRINKS TO AVOID OR LIMIT  Smoking or chewing tobacco. Nicotine is one of the most potent stimulants to acid production in the gastrointestinal tract.  Caffeinated and decaffeinated coffee and black tea.  Regular or low-calorie carbonated beverages or energy drinks (caffeine-free carbonated beverages are allowed).   Strong spices, such as black pepper, white pepper, red pepper, cayenne, curry powder, and chili powder.  Peppermint or spearmint.  Chocolate.  High-fat foods, including meats and fried foods. Extra added fats including oils, butter, salad dressings, and nuts. Limit these to less than 8 tsp per day.  Fruits and vegetables if they are not tolerated, such as citrus fruits or tomatoes.  Alcohol.  Any food that seems to aggravate  your condition. If you have questions regarding your diet, call your caregiver or a registered dietitian. OTHER THINGS THAT MAY HELP GERD INCLUDE:   Eating your meals slowly, in a relaxed setting.  Eating 5 to 6 small meals per day instead of 3 large meals.  Eliminating food for a period of time if it causes distress.  Not lying down until 3 hours after eating a meal.  Keeping the head of your bed raised 6 to 9 inches (15 to 23 cm) by using a foam wedge or blocks under the legs of the bed. Lying flat may make symptoms worse.  Being physically active. Weight loss may be helpful in reducing reflux in overweight or obese adults.  Wear loose fitting clothing EXAMPLE MEAL PLAN This meal plan is approximately 2,000 calories based on https://www.[facility].org/ meal planning guidelines. Breakfast   cup cooked oatmeal.  1 cup strawberries.  1 cup low-fat milk.  1 oz almonds. Snack  1 cup cucumber slices.  6 oz yogurt (made from low-fat or fat-free milk). Lunch  2 slice whole-wheat bread.  2 oz sliced  [place].  2 tsp mayonnaise.  1 cup blueberries.  1 cup snap peas. Snack  6 whole-wheat crackers.  1 oz string cheese. Dinner   cup brown rice.  1 cup mixed veggies.  1 tsp olive oil.  3 oz grilled fish. Document Released: 04/18/2005 Document Revised: 07/11/2011 Document Reviewed: 03/04/2011 [facility] Patient Information 2014 [place], [place].

## 2013-11-19 ENCOUNTER — Other Ambulatory Visit: Payer: Self-pay | Admitting: Obstetrics and Gynecology

## 2013-11-19 ENCOUNTER — Other Ambulatory Visit (HOSPITAL_COMMUNITY)
Admission: RE | Admit: 2013-11-19 | Discharge: 2013-11-19 | Disposition: A | Discharge status: Home / Self Care | Payer: Federal, State, Local not specified - PPO | Source: Ambulatory Visit | Attending: Obstetrics and Gynecology | Admitting: Obstetrics and Gynecology

## 2013-11-19 DIAGNOSIS — Z01419 Encounter for gynecological examination (general) (routine) without abnormal findings: Secondary | ICD-10-CM | POA: Insufficient documentation

## 2013-11-20 LAB — CYTOLOGY - PAP

## 2014-02-03 ENCOUNTER — Encounter (HOSPITAL_BASED_OUTPATIENT_CLINIC_OR_DEPARTMENT_OTHER): Payer: Self-pay | Admitting: Emergency Medicine

## 2014-02-03 ENCOUNTER — Emergency Department (HOSPITAL_BASED_OUTPATIENT_CLINIC_OR_DEPARTMENT_OTHER): Payer: Federal, State, Local not specified - PPO

## 2014-02-03 ENCOUNTER — Emergency Department (HOSPITAL_BASED_OUTPATIENT_CLINIC_OR_DEPARTMENT_OTHER)
Admission: EM | Admit: 2014-02-03 | Discharge: 2014-02-03 | Disposition: A | Discharge status: Home / Self Care | Payer: Federal, State, Local not specified - PPO | Attending: Emergency Medicine | Admitting: Emergency Medicine

## 2014-02-03 DIAGNOSIS — R000 Tachycardia, unspecified: Secondary | ICD-10-CM | POA: Insufficient documentation

## 2014-02-03 DIAGNOSIS — D649 Anemia, unspecified: Secondary | ICD-10-CM | POA: Insufficient documentation

## 2014-02-03 DIAGNOSIS — Z7982 Long term (current) use of aspirin: Secondary | ICD-10-CM | POA: Diagnosis not present

## 2014-02-03 DIAGNOSIS — Z8659 Personal history of other mental and behavioral disorders: Secondary | ICD-10-CM | POA: Diagnosis not present

## 2014-02-03 DIAGNOSIS — M549 Dorsalgia, unspecified: Secondary | ICD-10-CM | POA: Insufficient documentation

## 2014-02-03 DIAGNOSIS — Z79899 Other long term (current) drug therapy: Secondary | ICD-10-CM | POA: Diagnosis not present

## 2014-02-03 DIAGNOSIS — M79602 Pain in left arm: Secondary | ICD-10-CM | POA: Diagnosis present

## 2014-02-03 HISTORY — DX: Anxiety disorder, unspecified: F41.9

## 2014-02-03 LAB — CBC WITH DIFFERENTIAL/PLATELET
Basophils Absolute: 0 10*3/uL (ref 0.0–0.1)
Basophils Relative: 1 % (ref 0–1)
Eosinophils Absolute: 0 10*3/uL (ref 0.0–0.7)
Eosinophils Relative: 1 % (ref 0–5)
HCT: 37.7 % (ref 36.0–46.0)
Hemoglobin: 12.2 g/dL (ref 12.0–15.0)
Lymphocytes Relative: 41 % (ref 12–46)
Lymphs Abs: 1.2 10*3/uL (ref 0.7–4.0)
MCH: 23.6 pg — ABNORMAL LOW (ref 26.0–34.0)
MCHC: 32.4 g/dL (ref 30.0–36.0)
MCV: 72.9 fL — ABNORMAL LOW (ref 78.0–100.0)
Monocytes Absolute: 0.4 10*3/uL (ref 0.1–1.0)
Monocytes Relative: 13 % — ABNORMAL HIGH (ref 3–12)
Neutro Abs: 1.4 10*3/uL — ABNORMAL LOW (ref 1.7–7.7)
Neutrophils Relative %: 44 % (ref 43–77)
Platelets: 238 10*3/uL (ref 150–400)
RBC: 5.17 MIL/uL — ABNORMAL HIGH (ref 3.87–5.11)
RDW: 20.3 % — ABNORMAL HIGH (ref 11.5–15.5)
WBC: 3 10*3/uL — ABNORMAL LOW (ref 4.0–10.5)

## 2014-02-03 LAB — BASIC METABOLIC PANEL
Anion gap: 14 (ref 5–15)
BUN: 8 mg/dL (ref 6–23)
CO2: 24 mEq/L (ref 19–32)
Calcium: 9.6 mg/dL (ref 8.4–10.5)
Chloride: 101 mEq/L (ref 96–112)
Creatinine, Ser: 0.7 mg/dL (ref 0.50–1.10)
GFR calc Af Amer: >90 mL/min (ref >90)
GFR calc non Af Amer: >90 mL/min (ref >90)
Glucose, Bld: 91 mg/dL (ref 70–99)
Potassium: 3.9 mEq/L (ref 3.7–5.3)
Sodium: 139 mEq/L (ref 137–147)

## 2014-02-03 LAB — TROPONIN I: Troponin I: <0.3 ng/mL (ref <0.30)

## 2014-02-03 LAB — D-DIMER, QUANTITATIVE (NOT AT ARMC): D-Dimer, Quant: <0.27 ug/mL-FEU (ref 0.00–0.48)

## 2014-02-03 MED ORDER — IBUPROFEN 800 MG PO TABS
800.0000 mg | ORAL_TABLET | Freq: Once | ORAL | Status: AC
  Administered 2014-02-03: 800 mg via ORAL
  Filled 2014-02-03: qty 1

## 2014-02-03 MED ORDER — CYCLOBENZAPRINE HCL 10 MG PO TABS
5.0000 mg | ORAL_TABLET | Freq: Once | ORAL | Status: DC
  Filled 2014-02-03: qty 1

## 2014-02-03 MED ORDER — CYCLOBENZAPRINE HCL 10 MG PO TABS: 10.0000 mg | ORAL_TABLET | Freq: Two times a day (BID) | ORAL | Status: DC | PRN

## 2014-02-03 MED ORDER — OXYCODONE-ACETAMINOPHEN 5-325 MG PO TABS: 1.0000 | ORAL_TABLET | ORAL | Status: DC | PRN

## 2014-02-03 NOTE — Discharge Instructions (Signed)
Chest Wall Pain °Chest wall pain is pain felt in or around the chest bones and muscles. It may take up to 6 weeks to get better. It may take longer if you are active. Chest wall pain can happen on its own. Other times, things like germs, injury, coughing, or exercise can cause the pain. °HOME CARE  °· Avoid activities that make you tired or cause pain. Try not to use your chest, belly (abdominal), or side muscles. Do not use heavy weights. °· Put ice on the sore area. °¨ Put ice in a plastic bag. °¨ Place a towel between your skin and the bag. °¨ Leave the ice on for 15-20 minutes for the first 2 days. °· Only take medicine as told by your doctor. °GET HELP RIGHT AWAY IF:  °· You have more pain or are very uncomfortable. °· You have a fever. °· Your chest pain gets worse. °· You have new problems. °· You feel sick to your stomach (nauseous) or throw up (vomit). °· You start to sweat or feel lightheaded. °· You have a cough with mucus (phlegm). °· You cough up blood. °MAKE SURE YOU:  °· Understand these instructions. °· Will watch your condition. °· Will get help right away if you are not doing well or get worse. °Document Released: 10/05/2007 Document Revised: 07/11/2011 Document Reviewed: 12/13/2010 °[employer]® Patient Information ©2015 [employer]. This information is not intended to replace advice given to you by your health care provider. Make sure you discuss any questions you have with your health care provider. ° °

## 2014-02-03 NOTE — ED Provider Notes (Signed)
CSN: [id number]     Arrival date & time 02/03/14  0757 History   First MD Initiated Contact with Patient 02/03/14 [phone]     Chief Complaint  Patient presents with  . Arm Pain     (Consider location/radiation/quality/duration/timing/severity/associated sxs/prior Treatment) HPI 33 y.o. Female with left upper back pain began after lifting bike on Friday- sharp in nature and radiates around to under left breast with some paresthesias in left arm.  Pain increases with movement and decreases with being still.  Patient taking motrin with some relief.  She states she is concerned because she know heart disease presents differently in women.  Her uncle has a history of cad, mother with palpitations for which she takes beta blocker.  Patient with history of dvt, pe.  Denies cough, fever, sob, nausea, vomiting, abdominal or chest trauma.  Pain does not change with exertion.  No weakness of hand.  Past Medical History  Diagnosis Date  . Palpitation   . Anemia   . Anxiety    Past Surgical History  Procedure Laterality Date  . Tonsillectomy     History reviewed. No pertinent family history. History  Substance Use Topics  . Smoking status: Never Smoker   . Smokeless tobacco: Not on file  . Alcohol Use: Yes   OB History   Grav Para Term Preterm Abortions TAB SAB Ect Mult Living                 Review of Systems  All other systems reviewed and are negative.     Allergies  Metronidazole  Home Medications   Prior to Admission medications   Medication Sig Start Date End Date Taking? Authorizing Provider  albuterol (PROVENTIL HFA;VENTOLIN HFA) 108 (90 BASE) MCG/ACT inhaler Inhale 1-2 puffs into the lungs every 6 (six) hours as needed for wheezing. 12/07/10 12/07/11  April K Palumbo-Rasch, MD  aspirin 81 MG tablet Take 81 mg by mouth daily.    Historical Provider, MD  desloratadine (CLARINEX) 5 MG tablet Take 1 tablet (5 mg total) by mouth daily. 12/07/10 12/07/11  April K Palumbo-Rasch, MD   ferrous sulfate 325 (65 FE) MG tablet Take 325 mg by mouth daily with breakfast.    Historical Provider, MD  ibuprofen (ADVIL,MOTRIN) 800 MG tablet Take 800 mg by mouth every 8 (eight) hours as needed.    Historical Provider, MD  methocarbamol (ROBAXIN) 750 MG tablet Take 750 mg by mouth 4 (four) times daily.    Historical Provider, MD  naproxen sodium (ANAPROX) 220 MG tablet Take 220 mg by mouth daily as needed. For pain.    Historical Provider, MD  omeprazole (PRILOSEC) 20 MG capsule Take 1 capsule (20 mg total) by mouth daily. 07/30/13   Kristen N Ward, DO   BP 137/80  Pulse 105  Temp(Src) 99.2 F (37.3 C) (Oral)  Resp 18  Ht 5\' 1"  (1.549 m)  Wt 174 lb (78.926 kg)  BMI 32.89 kg/m2  SpO2 100%  LMP 01/13/2014 Physical Exam  Nursing note and vitals reviewed. Constitutional: She is oriented to person, place, and time. She appears well-developed and well-nourished.  HENT:  Head: Normocephalic and atraumatic.  Right Ear: External ear normal.  Left Ear: External ear normal.  Nose: Nose normal.  Mouth/Throat: Oropharynx is clear and moist.  Eyes: Conjunctivae and EOM are normal. Pupils are equal, round, and reactive to light.  Neck: Normal range of motion. Neck supple. No JVD present. No tracheal deviation present. No thyromegaly present.  Cardiovascular: Normal  rate, regular rhythm, normal heart sounds and intact distal pulses.   Pulmonary/Chest: Effort normal and breath sounds normal. No respiratory distress. She has no wheezes.  Abdominal: Soft. Bowel sounds are normal. She exhibits no mass. There is no tenderness. There is no guarding.  Musculoskeletal: Normal range of motion.  Lymphadenopathy:    She has no cervical adenopathy.  Neurological: She is alert and oriented to person, place, and time. She has normal reflexes. She displays normal reflexes. No cranial nerve deficit or sensory deficit. She exhibits normal muscle tone. Coordination and gait normal. GCS eye subscore is 4. GCS  verbal subscore is 5. GCS motor subscore is 6.  Reflex Scores:      Bicep reflexes are 2+ on the right side and 2+ on the left side.      Patellar reflexes are 2+ on the right side and 2+ on the left side. Strength is 5/5 bilateral elbow flexor/extensors, wrist extension/flexion, intrinsic hand strength equal Bilateral hip flexion/extension 5/5, knee flexion/extension 5/5, ankle 5/5 flexion extension No asymmetry of sensation throughout face and extremities.      Skin: Skin is warm and dry.  Psychiatric: She has a normal mood and affect. Her behavior is normal. Judgment and thought content normal.    ED Course  Procedures (including critical care time) Labs Review Labs Reviewed  CBC WITH DIFFERENTIAL - Abnormal; Notable for the following:    WBC 3.0 (*)    RBC 5.17 (*)    MCV 72.9 (*)    MCH 23.6 (*)    RDW 20.3 (*)    Neutro Abs 1.4 (*)    Monocytes Relative 13 (*)    All other components within normal limits  TROPONIN I  BASIC METABOLIC PANEL  D-DIMER, QUANTITATIVE    Imaging Review Dg Chest 2 View  02/03/2014   CLINICAL DATA:  33 year old female with left-sided chest pain. No history of recent trauma or injury.  EXAM: CHEST  2 VIEW  COMPARISON:  Prior chest x-Alene Bergerson 07/30/2013  FINDINGS: The lungs are clear and negative for focal airspace consolidation, pulmonary edema or suspicious pulmonary nodule. No pleural effusion or pneumothorax. Cardiac and mediastinal contours are within normal limits. No acute fracture or lytic or blastic osseous lesions. The visualized upper abdominal bowel gas pattern is unremarkable.  IMPRESSION: No active cardiopulmonary disease.   Electronically Signed   By: Malachy Moan M.D.   On: 02/03/2014 08:43     EKG Interpretation   Date/Time:  Monday February 03 2014 08:13:53 EDT Ventricular Rate:  97 PR Interval:  136 QRS Duration: 90 QT Interval:  366 QTC Calculation: 464 R Axis:   52 Text Interpretation:  Normal sinus rhythm Cannot rule out  Anterior infarct  , age undetermined Abnormal ECG No significant change since last tracing  Confirmed by Jahzier Villalon MD, Duwayne Heck [phone]) on 02/03/2014 8:46:31 AM      MDM   Final diagnoses:  Upper back pain on left side    33 y.o. Female with what appears to be musculoskeletal chest wall pain.  She voiced concern regarding cad and subsequent ekg shows prwp but no change from prior ekg.  Labs without evidence of anemia and d-dimer normal- patient with mild tachycardia on presentation but evidence of pe or anemia after testing.  Patient to be treated with smr, short course of percocet.. Patient is to be discharged with recommendation to follow up with PCP in regards to today's hospital visit. Chest pain is not likely of cardiac or pulmonary etiology  d/t presentation, perc negative, VSS, no tracheal deviation, no JVD or new murmur, RRR, breath sounds equal bilaterally, EKG without acute abnormalities, negative troponin, and negative CXR. Pt has been advised to return to the ED if CP becomes exertional, associated with diaphoresis or nausea, radiates to left jaw/arm, worsens or becomes concerning in any way. Pt appears reliable for follow up and is agreeable to discharge.       Hilario Quarryanielle S Kayli Beal, MD 02/03/14 [phone]

## 2014-02-03 NOTE — ED Notes (Signed)
Pt reports left arm pain radiating to mid back x lifting a bicycle on Friday. Pt states are is tender, increases with positioning, some relief with motrin. ekg in progress while pt being triaged.

## 2014-05-26 ENCOUNTER — Other Ambulatory Visit: Payer: Self-pay | Admitting: Obstetrics and Gynecology

## 2014-05-26 ENCOUNTER — Other Ambulatory Visit (HOSPITAL_COMMUNITY)
Admission: RE | Admit: 2014-05-26 | Discharge: 2014-05-26 | Disposition: A | Discharge status: Home / Self Care | Payer: Federal, State, Local not specified - PPO | Source: Ambulatory Visit | Attending: Obstetrics and Gynecology | Admitting: Obstetrics and Gynecology

## 2014-05-26 DIAGNOSIS — Z01419 Encounter for gynecological examination (general) (routine) without abnormal findings: Secondary | ICD-10-CM | POA: Diagnosis present

## 2014-05-26 DIAGNOSIS — N644 Mastodynia: Secondary | ICD-10-CM

## 2014-05-26 DIAGNOSIS — N6322 Unspecified lump in the left breast, upper inner quadrant: Secondary | ICD-10-CM

## 2014-05-27 LAB — CYTOLOGY - PAP

## 2014-06-02 ENCOUNTER — Other Ambulatory Visit: Payer: Federal, State, Local not specified - PPO

## 2014-06-20 ENCOUNTER — Other Ambulatory Visit: Payer: Federal, State, Local not specified - PPO

## 2014-06-26 ENCOUNTER — Inpatient Hospital Stay (HOSPITAL_COMMUNITY)
Admission: AD | Admit: 2014-06-26 | Discharge: 2014-06-27 | Disposition: A | Discharge status: Home / Self Care | Payer: Federal, State, Local not specified - PPO | Source: Ambulatory Visit | Attending: Obstetrics & Gynecology | Admitting: Obstetrics & Gynecology

## 2014-06-26 ENCOUNTER — Inpatient Hospital Stay (HOSPITAL_COMMUNITY): Payer: Federal, State, Local not specified - PPO

## 2014-06-26 ENCOUNTER — Encounter (HOSPITAL_COMMUNITY): Payer: Self-pay

## 2014-06-26 DIAGNOSIS — O200 Threatened abortion: Secondary | ICD-10-CM | POA: Diagnosis not present

## 2014-06-26 DIAGNOSIS — Z3A01 Less than 8 weeks gestation of pregnancy: Secondary | ICD-10-CM | POA: Diagnosis not present

## 2014-06-26 DIAGNOSIS — O209 Hemorrhage in early pregnancy, unspecified: Secondary | ICD-10-CM | POA: Diagnosis present

## 2014-06-26 DIAGNOSIS — N939 Abnormal uterine and vaginal bleeding, unspecified: Secondary | ICD-10-CM

## 2014-06-26 LAB — CBC
HCT: 39.5 % (ref 36.0–46.0)
Hemoglobin: 13.1 g/dL (ref 12.0–15.0)
MCH: 25.2 pg — ABNORMAL LOW (ref 26.0–34.0)
MCHC: 33.2 g/dL (ref 30.0–36.0)
MCV: 76 fL — ABNORMAL LOW (ref 78.0–100.0)
Platelets: 199 10*3/uL (ref 150–400)
RBC: 5.2 MIL/uL — ABNORMAL HIGH (ref 3.87–5.11)
RDW: 18.9 % — ABNORMAL HIGH (ref 11.5–15.5)
WBC: 5.2 10*3/uL (ref 4.0–10.5)

## 2014-06-26 LAB — URINALYSIS, ROUTINE W REFLEX MICROSCOPIC
Bilirubin Urine: NEGATIVE
Glucose, UA: NEGATIVE mg/dL
Ketones, ur: NEGATIVE mg/dL
Leukocytes, UA: NEGATIVE
Nitrite: NEGATIVE
Protein, ur: NEGATIVE mg/dL
Specific Gravity, Urine: 1.01 (ref 1.005–1.030)
Urobilinogen, UA: 0.2 mg/dL (ref 0.0–1.0)
pH: 7 (ref 5.0–8.0)

## 2014-06-26 LAB — URINE MICROSCOPIC-ADD ON

## 2014-06-26 LAB — POCT PREGNANCY, URINE: Preg Test, Ur: POSITIVE — AB

## 2014-06-26 NOTE — MAU Note (Signed)
Red spotting on toilet paper when she wipes; had brown discharge earlier this week. Mild abdominal cramping intermittently since finding out pregnant. Denies urinary complaints. Some nausea; denies vomiting/diarrhea/constipation. Last had intercourse 2/15.

## 2014-06-26 NOTE — MAU Provider Note (Signed)
History     CSN: [id number]  Arrival date and time: 06/26/14 2230   First Provider Initiated Contact with Patient 06/26/14 2309      Chief Complaint  Patient presents with  . Vaginal Bleeding   HPI Kiara Hall 34 y.o. G1P0  presents to MAU complaining of vaginal bleeding x 1 today.  She noticed it upon wiping at 10:30pm this evening.  It was bright red blood.  None found in toilet and none noted since.  She has also been having abdominal cramping, intermittently for a few weeks.  She denies vomiting, weakness, fever, chills.  She is having some nausea.   OB History    Gravida Para Term Preterm AB TAB SAB Ectopic Multiple Living   1               Past Medical History  Diagnosis Date  . Palpitation   . Anemia   . Anxiety     Past Surgical History  Procedure Laterality Date  . Tonsillectomy      History reviewed. No pertinent family history.  History  Substance Use Topics  . Smoking status: Never Smoker   . Smokeless tobacco: Not on file  . Alcohol Use: No    Allergies:  Allergies  Allergen Reactions  . Metronidazole Hives and Shortness Of Breath    Prescriptions prior to admission  Medication Sig Dispense Refill Last Dose  . ferrous sulfate 325 (65 FE) MG tablet Take 325 mg by mouth daily with breakfast.   06/25/2014 at Unknown time  . Prenatal Vit-Fe Fumarate-FA (PRENATAL MULTIVITAMIN) TABS tablet Take 1 tablet by mouth daily at 12 noon.   06/25/2014 at Unknown time    ROS Pertinent ROS in HPI Physical Exam   Blood pressure 141/78, pulse 101, temperature 98.8 F (37.1 C), temperature source Oral, resp. rate 16, last menstrual period 05/10/2014.  Physical Exam  Constitutional: She is oriented to person, place, and time. She appears well-developed and well-nourished. No distress.  HENT:  Head: Normocephalic and atraumatic.  Eyes: EOM are normal.  Neck: Normal range of motion.  Cardiovascular: Normal rate and regular rhythm.   Respiratory: Effort  normal and breath sounds normal. No respiratory distress.  GI: Soft. Bowel sounds are normal. She exhibits no distension. There is no tenderness. There is no rebound and no guarding.  Genitourinary:  Scant blood-tinged discharge in vagina. No active bleeding.  Cervix is closed with no CMT.  NO adnexal mass or tenderness appreciated  Musculoskeletal: Normal range of motion.  Neurological: She is alert and oriented to person, place, and time.  Skin: Skin is warm and dry.  Psychiatric: She has a normal mood and affect.   Results for orders placed or performed during the hospital encounter of 06/26/14 (from the past 24 hour(s))  Urinalysis, Routine w reflex microscopic     Status: Abnormal   Collection Time: 06/26/14 10:37 PM  Result Value Ref Range   Color, Urine YELLOW YELLOW   APPearance CLEAR CLEAR   Specific Gravity, Urine 1.010 1.005 - 1.030   pH 7.0 5.0 - 8.0   Glucose, UA NEGATIVE NEGATIVE mg/dL   Hgb urine dipstick TRACE (A) NEGATIVE   Bilirubin Urine NEGATIVE NEGATIVE   Ketones, ur NEGATIVE NEGATIVE mg/dL   Protein, ur NEGATIVE NEGATIVE mg/dL   Urobilinogen, UA 0.2 0.0 - 1.0 mg/dL   Nitrite NEGATIVE NEGATIVE   Leukocytes, UA NEGATIVE NEGATIVE  Urine microscopic-add on     Status: None   Collection Time: 06/26/14  10:37 PM  Result Value Ref Range   Squamous Epithelial / LPF RARE RARE   WBC, UA 0-2 <3 WBC/hpf   RBC / HPF 0-2 <3 RBC/hpf   Bacteria, UA RARE RARE  Pregnancy, urine POC     Status: Abnormal   Collection Time: 06/26/14 10:57 PM  Result Value Ref Range   Preg Test, Ur POSITIVE (A) NEGATIVE  CBC     Status: Abnormal   Collection Time: 06/26/14 11:18 PM  Result Value Ref Range   WBC 5.2 4.0 - 10.5 K/uL   RBC 5.20 (H) 3.87 - 5.11 MIL/uL   Hemoglobin 13.1 12.0 - 15.0 g/dL   HCT 13.239.5 44.036.0 - 10.246.0 %   MCV 76.0 (L) 78.0 - 100.0 fL   MCH 25.2 (L) 26.0 - 34.0 pg   MCHC 33.2 30.0 - 36.0 g/dL   RDW 72.518.9 (H) 36.611.5 - 44.015.5 %   Platelets 199 150 - 400 K/uL  ABO/Rh      Status: None (Preliminary result)   Collection Time: 06/26/14 11:18 PM  Result Value Ref Range   ABO/RH(D) O POS   hCG, quantitative, pregnancy     Status: Abnormal   Collection Time: 06/26/14 11:18 PM  Result Value Ref Range   hCG, Beta Chain, Quant, S [id number] (H) <5 mIU/mL   [place] Ob Comp Less 14 Wks  06/27/2014   CLINICAL DATA:  Spotting today with cramping on off. Brown discharge seen earlier in the week. Estimated gestational age by LMP is 6 weeks 5 days. Quantitative beta HCG is in progress.  EXAM: OBSTETRIC <14 WK US AND TRANSVAGINAL OB US  TECHNIQUE: Both transabdominal and transvaginal ultrasound examinations were performed for complete evaluation of the gestation as well as the maternal uterus, adnexal regions, and pelvic cul-de-sac. Transvaginal technique was performed to assess early pregnancy.  COMPARISON:  None.  FINDINGS: Intrauterine gestational sac: Visualized/normal in shape.  Yolk sac:  Present.  Embryo:  Present.  Cardiac Activity: Observed.  Heart Rate: 134  bpm  CRL:  10  mm   7 w   1 d                  US EDC: 02/11/2015  Maternal uterus/adnexae: The uterus is anteverted. No myometrial mass lesions. No subchorionic hemorrhage. Small nabothian cysts in the cervix. Both ovaries are visualized and appear normal. Corpus luteum cyst on the right ovary. No free pelvic fluid collections.  IMPRESSION: Single intrauterine pregnancy. Estimated gestational age by crown-rump length is 7 weeks 1 day. No acute complications suggested.   Electronically Signed   By: Burman NievesWilliam  Stevens M.D.   On: 06/27/2014 00:22   [place] Ob Transvaginal  06/27/2014   CLINICAL DATA:  Spotting today with cramping on off. Brown discharge seen earlier in the week. Estimated gestational age by LMP is 6 weeks 5 days. Quantitative beta HCG is in progress.  EXAM: OBSTETRIC <14 WK US AND TRANSVAGINAL OB US  TECHNIQUE: Both transabdominal and transvaginal ultrasound examinations were performed for complete evaluation of the gestation  as well as the maternal uterus, adnexal regions, and pelvic cul-de-sac. Transvaginal technique was performed to assess early pregnancy.  COMPARISON:  None.  FINDINGS: Intrauterine gestational sac: Visualized/normal in shape.  Yolk sac:  Present.  Embryo:  Present.  Cardiac Activity: Observed.  Heart Rate: 134  bpm  CRL:  10  mm   7 w   1 d  [place] EDC: 02/11/2015  Maternal uterus/adnexae: The uterus is anteverted. No myometrial mass lesions. No subchorionic hemorrhage. Small nabothian cysts in the cervix. Both ovaries are visualized and appear normal. Corpus luteum cyst on the right ovary. No free pelvic fluid collections.  IMPRESSION: Single intrauterine pregnancy. Estimated gestational age by crown-rump length is 7 weeks 1 day. No acute complications suggested.   Electronically Signed   By: Burman Nieves M.D.   On: 06/27/2014 00:22    MAU Course  Procedures  MDM IUP confirmed on u/s with cardiac activity, CRL [redacted]w[redacted]d.  This corresponds with HCG.   Rh status is positive.   Discussed with Dr. Su Hilt, on call for [facility].  She is in agreement to discharge pt to home.  No further tx required.  Assessment and Plan  A:  1. Threatened miscarriage in early pregnancy   2. Vagina bleeding    P: Discharge to home Pelvic rest PNV qd Obtain [facility] asap Patient may return to MAU as needed or if her condition were to change or worsen   Bertram Denver 06/26/2014, 11:09 PM

## 2014-06-27 DIAGNOSIS — O200 Threatened abortion: Secondary | ICD-10-CM | POA: Diagnosis not present

## 2014-06-27 LAB — HIV ANTIBODY (ROUTINE TESTING W REFLEX): HIV Screen 4th Generation wRfx: NONREACTIVE

## 2014-06-27 LAB — ABO/RH: ABO/RH(D): O POS

## 2014-06-27 LAB — HCG, QUANTITATIVE, PREGNANCY: hCG, Beta Chain, Quant, S: 82667 m[IU]/mL — ABNORMAL HIGH (ref <5)

## 2014-06-27 NOTE — Discharge Instructions (Signed)
Threatened Miscarriage °A threatened miscarriage occurs when you have vaginal bleeding during your first 20 weeks of pregnancy but the pregnancy has not ended. If you have vaginal bleeding during this time, your health care provider will do tests to make sure you are still pregnant. If the tests show you are still pregnant and the developing baby (fetus) inside your womb (uterus) is still growing, your condition is considered a threatened miscarriage. °A threatened miscarriage does not mean your pregnancy will end, but it does increase the risk of losing your pregnancy (complete miscarriage). °CAUSES  °The cause of a threatened miscarriage is usually not known. If you go on to have a complete miscarriage, the most common cause is an abnormal number of chromosomes in the developing baby. Chromosomes are the structures inside cells that hold all your genetic material. °Some causes of vaginal bleeding that do not result in miscarriage include: °· Having sex. °· Having an infection. °· Normal hormone changes of pregnancy. °· Bleeding that occurs when an egg implants in your uterus. °RISK FACTORS °Risk factors for bleeding in early pregnancy include: °· Obesity. °· Smoking. °· Drinking excessive amounts of alcohol or caffeine. °· Recreational drug use. °SIGNS AND SYMPTOMS °· Light vaginal bleeding. °· Mild abdominal pain or cramps. °DIAGNOSIS  °If you have bleeding with or without abdominal pain before 20 weeks of pregnancy, your health care provider will do tests to check whether you are still pregnant. One important test involves using sound waves and a computer (ultrasound) to create images of the inside of your uterus. Other tests include an internal exam of your vagina and uterus (pelvic exam) and measurement of your baby's heart rate.  °You may be diagnosed with a threatened miscarriage if: °· Ultrasound testing shows you are still pregnant. °· Your baby's heart rate is strong. °· A pelvic exam shows that the  opening between your uterus and your vagina (cervix) is closed. °· Your heart rate and blood pressure are stable. °· Blood tests confirm you are still pregnant. °TREATMENT  °No treatments have been shown to prevent a threatened miscarriage from going on to a complete miscarriage. However, the right home care is important.  °HOME CARE INSTRUCTIONS  °· Make sure you keep all your appointments for prenatal care. This is very important. °· Get plenty of rest. °· Do not have sex or use tampons if you have vaginal bleeding. °· Do not douche. °· Do not smoke or use recreational drugs. °· Do not drink alcohol. °· Avoid caffeine. °SEEK MEDICAL CARE IF: °· You have light vaginal bleeding or spotting while pregnant. °· You have abdominal pain or cramping. °· You have a fever. °SEEK IMMEDIATE MEDICAL CARE IF: °· You have heavy vaginal bleeding. °· You have blood clots coming from your vagina. °· You have severe low back pain or abdominal cramps. °· You have fever, chills, and severe abdominal pain. °MAKE SURE YOU: °· Understand these instructions. °· Will watch your condition. °· Will get help right away if you are not doing well or get worse. °Document Released: 04/18/2005 Document Revised: 04/23/2013 Document Reviewed: 02/12/2013 °ExitCare® Patient Information ©2015 [employer]. This information is not intended to replace advice given to you by your health care provider. Make sure you discuss any questions you have with your health care provider. ° °Pelvic Rest °Pelvic rest is sometimes recommended for women when:  °· The placenta is partially or completely covering the opening of the cervix (placenta previa). °· There is bleeding between the   uterine wall and the amniotic sac in the first trimester (subchorionic hemorrhage). °· The cervix begins to open without labor starting (incompetent cervix, cervical insufficiency). °· The labor is too early (preterm labor). °HOME CARE INSTRUCTIONS °· Do not have sexual intercourse,  stimulation, or an orgasm. °· Do not use tampons, douche, or put anything in the vagina. °· Do not lift anything over 10 pounds (4.5 kg). °· Avoid strenuous activity or straining your pelvic muscles. °SEEK MEDICAL CARE IF:  °· You have any vaginal bleeding during pregnancy. Treat this as a potential emergency. °· You have cramping pain felt low in the stomach (stronger than menstrual cramps). °· You notice vaginal discharge (watery, mucus, or bloody). °· You have a low, dull backache. °· There are regular contractions or uterine tightening. °SEEK IMMEDIATE MEDICAL CARE IF: °You have vaginal bleeding and have placenta previa.  °Document Released: 08/13/2010 Document Revised: 07/11/2011 Document Reviewed: 08/13/2010 °ExitCare® Patient Information ©2015 [employer]. This information is not intended to replace advice given to you by your health care provider. Make sure you discuss any questions you have with your health care provider. ° °

## 2014-07-03 LAB — OB RESULTS CONSOLE HEPATITIS B SURFACE ANTIGEN: Hepatitis B Surface Ag: NEGATIVE

## 2014-07-03 LAB — OB RESULTS CONSOLE ABO/RH: RH Type: POSITIVE

## 2014-07-03 LAB — OB RESULTS CONSOLE ANTIBODY SCREEN: Antibody Screen: NEGATIVE

## 2014-07-03 LAB — OB RESULTS CONSOLE RPR: RPR: NONREACTIVE

## 2014-07-03 LAB — OB RESULTS CONSOLE HIV ANTIBODY (ROUTINE TESTING): HIV: NONREACTIVE

## 2014-07-03 LAB — OB RESULTS CONSOLE RUBELLA ANTIBODY, IGM: Rubella: IMMUNE

## 2014-07-29 LAB — OB RESULTS CONSOLE GC/CHLAMYDIA
Chlamydia: NEGATIVE
Gonorrhea: NEGATIVE

## 2014-08-16 ENCOUNTER — Telehealth: Payer: Self-pay

## 2014-08-16 ENCOUNTER — Encounter (HOSPITAL_COMMUNITY): Payer: Self-pay | Admitting: *Deleted

## 2014-08-16 ENCOUNTER — Inpatient Hospital Stay (HOSPITAL_COMMUNITY): Payer: Federal, State, Local not specified - PPO

## 2014-08-16 ENCOUNTER — Inpatient Hospital Stay (HOSPITAL_COMMUNITY)
Admission: AD | Admit: 2014-08-16 | Discharge: 2014-08-16 | Disposition: A | Discharge status: Home / Self Care | Payer: Federal, State, Local not specified - PPO | Source: Ambulatory Visit | Attending: Obstetrics and Gynecology | Admitting: Obstetrics and Gynecology

## 2014-08-16 DIAGNOSIS — O209 Hemorrhage in early pregnancy, unspecified: Secondary | ICD-10-CM | POA: Diagnosis not present

## 2014-08-16 DIAGNOSIS — Z3A14 14 weeks gestation of pregnancy: Secondary | ICD-10-CM | POA: Insufficient documentation

## 2014-08-16 DIAGNOSIS — O468X1 Other antepartum hemorrhage, first trimester: Secondary | ICD-10-CM

## 2014-08-16 DIAGNOSIS — O43891 Other placental disorders, first trimester: Secondary | ICD-10-CM

## 2014-08-16 DIAGNOSIS — O418X10 Other specified disorders of amniotic fluid and membranes, first trimester, not applicable or unspecified: Secondary | ICD-10-CM

## 2014-08-16 NOTE — Progress Notes (Signed)
Spec exam only 

## 2014-08-16 NOTE — Progress Notes (Signed)
Venia CarbonJennifer Rasch NP in earlier to discuss u/s results and d/c plan. Written and verbal d/c instructions given and understanding voiced.

## 2014-08-16 NOTE — Discharge Instructions (Signed)
Pelvic Rest °Pelvic rest is sometimes recommended for women when:  °· The placenta is partially or completely covering the opening of the cervix (placenta previa). °· There is bleeding between the uterine wall and the amniotic sac in the first trimester (subchorionic hemorrhage). °· The cervix begins to open without labor starting (incompetent cervix, cervical insufficiency). °· The labor is too early (preterm labor). °HOME CARE INSTRUCTIONS °· Do not have sexual intercourse, stimulation, or an orgasm. °· Do not use tampons, douche, or put anything in the vagina. °· Do not lift anything over 10 pounds (4.5 kg). °· Avoid strenuous activity or straining your pelvic muscles. °SEEK MEDICAL CARE IF:  °· You have any vaginal bleeding during pregnancy. Treat this as a potential emergency. °· You have cramping pain felt low in the stomach (stronger than menstrual cramps). °· You notice vaginal discharge (watery, mucus, or bloody). °· You have a low, dull backache. °· There are regular contractions or uterine tightening. °SEEK IMMEDIATE MEDICAL CARE IF: °You have vaginal bleeding and have placenta previa.  °Document Released: 08/13/2010 Document Revised: 07/11/2011 Document Reviewed: 08/13/2010 °ExitCare® Patient Information ©2015 [employer]. This information is not intended to replace advice given to you by your health care provider. Make sure you discuss any questions you have with your health care provider. ° °Subchorionic Hematoma °A subchorionic hematoma is a gathering of blood between the outer wall of the placenta and the inner wall of the womb (uterus). The placenta is the organ that connects the fetus to the wall of the uterus. The placenta performs the feeding, breathing (oxygen to the fetus), and waste removal (excretory work) of the fetus.  °Subchorionic hematoma is the most common abnormality found on a result from ultrasonography done during the first trimester or early second trimester of pregnancy. If  there has been little or no vaginal bleeding, early small hematomas usually shrink on their own and do not affect your baby or pregnancy. The blood is gradually absorbed over 1-2 weeks. When bleeding starts later in pregnancy or the hematoma is larger or occurs in an older pregnant woman, the outcome may not be as good. Larger hematomas may get bigger, which increases the chances for miscarriage. Subchorionic hematoma also increases the risk of premature detachment of the placenta from the uterus, preterm (premature) labor, and stillbirth. °HOME CARE INSTRUCTIONS °· Stay on bed rest if your health care provider recommends this. Although bed rest will not prevent more bleeding or prevent a miscarriage, your health care provider may recommend bed rest until you are advised otherwise. °· Avoid heavy lifting (more than 10 lb [4.5 kg]), exercise, sexual intercourse, or douching as directed by your health care provider. °· Keep track of the number of pads you use each day and how soaked (saturated) they are. Write down this information. °· Do not use tampons. °· Keep all follow-up appointments as directed by your health care provider. Your health care provider may ask you to have follow-up blood tests or ultrasound tests or both. °SEEK IMMEDIATE MEDICAL CARE IF: °· You have severe cramps in your stomach, back, abdomen, or pelvis. °· You have a fever. °· You pass large clots or tissue. Save any tissue for your health care provider to look at. °· Your bleeding increases or you become lightheaded, feel weak, or have fainting episodes. °Document Released: 08/03/2006 Document Revised: 09/02/2013 Document Reviewed: 11/15/2012 °ExitCare® Patient Information ©2015 [employer]. This information is not intended to replace advice given to you by your health care provider.   Make sure you discuss any questions you have with your health care provider. ° °

## 2014-08-16 NOTE — Telephone Encounter (Signed)
Patient reports that she woke up in a "wet stain" and thought that she urinated on herself.  Patient states that when she looked at the area it was blood.  Reports gown saturated and blood noted on tissue.  Patient reports blood was bright red, with small clots.  Patient reports she had some cramping yesterday for about two hours and stopped prior to resting.  Patient denies issues with urination or diarrhea, but admits constipation.  Patient reports bleeding is continuous while speaking with provider.  Patient instructed to report to MAU for evaluation by provider.

## 2014-08-16 NOTE — MAU Provider Note (Signed)
History     CSN: [id number]  Arrival date and time: 08/16/14 0335   None     Chief Complaint  Patient presents with  . Vaginal Bleeding   HPI   Ms. Kiara Hall is a 34 y.o. female G1P0 at 3313w0d who presents with vaginal bleeding. The bleeding started a few hours ago; just before she came into MAU. She rolled over in bed and felt a warm gush. Bleeding is bright red and she has continued to notice it when she wipes. It is not heavy enough that she has to wear a pad. She has not had any other ultrasounds in this pregnancy, other than at 6 weeks. She denies pain.   OB History    Gravida Para Term Preterm AB TAB SAB Ectopic Multiple Living   1               Past Medical History  Diagnosis Date  . Palpitation   . Anemia   . Anxiety     Past Surgical History  Procedure Laterality Date  . Tonsillectomy      Family History  Problem Relation Age of Onset  . Diabetes Mother   . Hypertension Mother   . Heart disease Maternal Uncle   . Hypertension Maternal Grandfather     History  Substance Use Topics  . Smoking status: Never Smoker   . Smokeless tobacco: Not on file  . Alcohol Use: No    Allergies:  Allergies  Allergen Reactions  . Metronidazole Hives and Shortness Of Breath    Prescriptions prior to admission  Medication Sig Dispense Refill Last Dose  . ferrous sulfate 325 (65 FE) MG tablet Take 325 mg by mouth daily with breakfast.   08/15/2014 at Unknown time  . Prenatal Vit-Fe Fumarate-FA (PRENATAL MULTIVITAMIN) TABS tablet Take 1 tablet by mouth daily at 12 noon.   08/15/2014 at Unknown time   No results found for this or any previous visit (from the past 48 hour(s)).   No results found.   Review of Systems  Constitutional: Negative for fever and chills.  Gastrointestinal: Negative for abdominal pain.  Genitourinary: Negative for dysuria.   Physical Exam   Blood pressure 141/77, pulse 102, temperature 97.9 F (36.6 C), temperature source Oral,  resp. rate 20, last menstrual period 05/10/2014.  Physical Exam  Constitutional: She is oriented to person, place, and time. She appears well-developed and well-nourished. No distress.  HENT:  Head: Normocephalic.  Eyes: Pupils are equal, round, and reactive to light.  Neck: Neck supple.  Genitourinary:  Speculum exam: Vagina - Small amount of dark red, mucus like bleeding.  Cervix - + bleeding, scant amount.  Bimanual exam: deferred  Chaperone present for exam.  Musculoskeletal: Normal range of motion.  Neurological: She is alert and oriented to person, place, and time.  Skin: Skin is warm. She is not diaphoretic.  Psychiatric: Her behavior is normal.    MAU Course  Procedures  None  MDM O positive blood type + fetal heart tones by doppler.  US OB limited   Discussed patient with Dr. Estanislado Pandyivard at 0600 Preliminary report shows a small subchorionic hemorrhage. Discussed this with the patient in detail.   Assessment and Plan   A:  1. Subchorionic hematoma in first trimester   2. Vaginal bleeding in pregnancy, first trimester     P:  Discharge home in stable condition Pelvic rest Bleeding precautions Follow up with Dr. Richardson Doppole Monday if needed Return to MAU if symptoms  worsen   Duane Lope, NP 08/16/2014 5:40 AM

## 2014-08-16 NOTE — MAU Note (Signed)
Awoke about 0245 and felt wet. Went to BR and had bright red blood on tissue. No pain. No intercourse in last 24hrs.

## 2014-08-16 NOTE — Progress Notes (Signed)
Venia CarbonJennifer Rasch NP notified of pt's admission and status. Will do u/s

## 2015-01-09 ENCOUNTER — Inpatient Hospital Stay (HOSPITAL_COMMUNITY)
Admission: AD | Admit: 2015-01-09 | Discharge: 2015-01-09 | Disposition: A | Discharge status: Home / Self Care | Payer: Federal, State, Local not specified - PPO | Source: Ambulatory Visit | Attending: Obstetrics and Gynecology | Admitting: Obstetrics and Gynecology

## 2015-01-09 ENCOUNTER — Encounter (HOSPITAL_COMMUNITY): Payer: Self-pay | Admitting: *Deleted

## 2015-01-09 DIAGNOSIS — O4703 False labor before 37 completed weeks of gestation, third trimester: Secondary | ICD-10-CM | POA: Diagnosis not present

## 2015-01-09 DIAGNOSIS — Z3A34 34 weeks gestation of pregnancy: Secondary | ICD-10-CM | POA: Insufficient documentation

## 2015-01-09 DIAGNOSIS — O4700 False labor before 37 completed weeks of gestation, unspecified trimester: Secondary | ICD-10-CM

## 2015-01-09 MED ORDER — NIFEDIPINE ER OSMOTIC RELEASE 30 MG PO TB24: 30.0000 mg | ORAL_TABLET | Freq: Every day | ORAL | Status: DC

## 2015-01-09 MED ORDER — NIFEDIPINE ER OSMOTIC RELEASE 30 MG PO TB24
30.0000 mg | ORAL_TABLET | Freq: Once | ORAL | Status: AC
  Administered 2015-01-09: 30 mg via ORAL
  Filled 2015-01-09: qty 1

## 2015-01-09 MED ORDER — SODIUM CHLORIDE 0.9 % IV BOLUS (SEPSIS)
1000.0000 mL | Freq: Once | INTRAVENOUS | Status: AC
  Administered 2015-01-09: 1000 mL via INTRAVENOUS

## 2015-01-09 NOTE — MAU Provider Note (Signed)
MAU HISTORY AND PHYSICAL  Chief Complaint:  Contractions   Kiara Hall is a 34 y.o.  G1P0 with IUP at [redacted]w[redacted]d presenting for Contractions  Last night awoke with back pain and urge to have bowel movement. Also with intermittent contractions, some moderate in intensity. Not worsening in intensity or frequency. Positive fetal movements. No leakage of fluid, no bleeding, no discharge. No fever or chills or nausea or vomiting.   Went to prenatal provider today and cervix said to be closed, sent here for monitoring. Has f/u there on Monday.    Menstrual History: OB History    Gravida Para Term Preterm AB TAB SAB Ectopic Multiple Living   1               Patient's last menstrual period was 05/10/2014 (exact date).      Past Medical History  Diagnosis Date  . Palpitation   . Anemia   . Anxiety     Past Surgical History  Procedure Laterality Date  . Tonsillectomy      Family History  Problem Relation Age of Onset  . Diabetes Mother   . Hypertension Mother   . Heart disease Maternal Uncle   . Hypertension Maternal Grandfather     Social History  Substance Use Topics  . Smoking status: Never Smoker   . Smokeless tobacco: None  . Alcohol Use: No      Allergies  Allergen Reactions  . Metronidazole Hives and Shortness Of Breath    Prescriptions prior to admission  Medication Sig Dispense Refill Last Dose  . acetaminophen (TYLENOL) 500 MG tablet Take 500 mg by mouth every 6 (six) hours as needed for moderate pain.   01/09/2015 at Unknown time  . ferrous sulfate 325 (65 FE) MG tablet Take 325 mg by mouth daily with breakfast.   Past Week at Unknown time  . Prenatal Vit-Fe Fumarate-FA (PRENATAL MULTIVITAMIN) TABS tablet Take 1 tablet by mouth daily at 12 noon.   Past Week at Unknown time    Review of Systems - Negative except for what is mentioned in HPI.  Physical Exam  Blood pressure 136/72, pulse 108, temperature 98.9 F (37.2 C), resp. rate 18, height  (1.549  m), weight 224 lb 9.6 oz (101.878 kg), last menstrual period 05/10/2014. GENERAL: Well-developed, well-nourished female in no acute distress.  LUNGS: Clear to auscultation bilaterally.  HEART: Regular rate and rhythm. ABDOMEN: Soft, nontender, nondistended, gravid.  EXTREMITIES: Nontender, no edema, 2+ distal pulses. Cervical Exam: closed/thick/high Presentation: cephalic FHT:  Initial baseline 170s, then 150/mod/+a/-d Contractions: irregular every 5-10 min   Labs: No results found for this or any previous visit (from the past 24 hour(s)).  Imaging Studies:  No results found.  Assessment: Kiara Hall is  34 y.o. G1P0 at [redacted]w[redacted]d presents with threatened preterm labor. Here cervix closed as it was a few hours ago at her prenatal providers. NST reactive. Initially FHTs tachycardic, but after 1 L fluids normal baseline. GAve procardia once. No signs PPROM or abruption.  Plan: - d/c home with push fluids, PTL/PPROM return precautions, kick counts, and procardia prescription. This plan discussed and agreed upon w/ patient's prenatal provider Dr. Dion Body. F/u 3 days that office.  Kiara Hall Kiara Hall 9/9/20165:21 PM

## 2015-01-09 NOTE — MAU Note (Signed)
Pt sent from office to be monitored for contractions.  Good fetal movement reported. Denies SROM or bleeding.

## 2015-01-09 NOTE — Discharge Instructions (Signed)
Braxton Hicks Contractions °Contractions of the uterus can occur throughout pregnancy. Contractions are not always a sign that you are in labor.  °WHAT ARE BRAXTON HICKS CONTRACTIONS?  °Contractions that occur before labor are called Braxton Hicks contractions, or false labor. Toward the end of pregnancy (32-34 weeks), these contractions can develop more often and may become more forceful. This is not true labor because these contractions do not result in opening (dilatation) and thinning of the cervix. They are sometimes difficult to tell apart from true labor because these contractions can be forceful and people have different pain tolerances. You should not feel embarrassed if you go to the hospital with false labor. Sometimes, the only way to tell if you are in true labor is for your health care provider to look for changes in the cervix. °If there are no prenatal problems or other health problems associated with the pregnancy, it is completely safe to be sent home with false labor and await the onset of true labor. °HOW CAN YOU TELL THE DIFFERENCE BETWEEN TRUE AND FALSE LABOR? °False Labor °· The contractions of false labor are usually shorter and not as hard as those of true labor.   °· The contractions are usually irregular.   °· The contractions are often felt in the front of the lower abdomen and in the groin.   °· The contractions may go away when you walk around or change positions while lying down.   °· The contractions get weaker and are shorter lasting as time goes on.   °· The contractions do not usually become progressively stronger, regular, and closer together as with true labor.   °True Labor °· Contractions in true labor last 30-70 seconds, become very regular, usually become more intense, and increase in frequency.   °· The contractions do not go away with walking.   °· The discomfort is usually felt in the top of the uterus and spreads to the lower abdomen and low back.   °· True labor can be  determined by your health care provider with an exam. This will show that the cervix is dilating and getting thinner.   °WHAT TO REMEMBER °· Keep up with your usual exercises and follow other instructions given by your health care provider.   °· Take medicines as directed by your health care provider.   °· Keep your regular prenatal appointments.   °· Eat and drink lightly if you think you are going into labor.   °· If Braxton Hicks contractions are making you uncomfortable:   °· Change your position from lying down or resting to walking, or from walking to resting.   °· Sit and rest in a tub of warm water.   °· Drink 2-3 glasses of water. Dehydration may cause these contractions.   °· Do slow and deep breathing several times an hour.   °WHEN SHOULD I SEEK IMMEDIATE MEDICAL CARE? °Seek immediate medical care if: °· Your contractions become stronger, more regular, and closer together.   °· You have fluid leaking or gushing from your vagina.   °· You have a fever.   °· You pass blood-tinged mucus.   °· You have vaginal bleeding.   °· You have continuous abdominal pain.   °· You have low back pain that you never had before.   °· You feel your baby's head pushing down and causing pelvic pressure.   °· Your baby is not moving as much as it used to.   °Document Released: 04/18/2005 Document Revised: 04/23/2013 Document Reviewed: 01/28/2013 °Exit[employer]® Patient Information ©2015 [employer]. This information is not intended to replace advice given to you by your health care   provider. Make sure you discuss any questions you have with your health care provider. ° °Abdominal Pain During Pregnancy °Belly (abdominal) pain is common during pregnancy. Most of the time, it is not a serious problem. Other times, it can be a sign that something is wrong with the pregnancy. Always tell your doctor if you have belly pain. °HOME CARE °Monitor your belly pain for any changes. The following actions may help you feel better: °· Do not  have sex (intercourse) or put anything in your vagina until you feel better. °· Rest until your pain stops. °· Drink clear fluids if you feel sick to your stomach (nauseous). Do not eat solid food until you feel better. °· Only take medicine as told by your doctor. °· Keep all doctor visits as told. °GET HELP RIGHT AWAY IF:  °· You are bleeding, leaking fluid, or pieces of tissue come out of your vagina. °· You have more pain or cramping. °· You keep throwing up (vomiting). °· You have pain when you pee (urinate) or have blood in your pee. °· You have a fever. °· You do not feel your baby moving as much. °· You feel very weak or feel like passing out. °· You have trouble breathing, with or without belly pain. °· You have a very bad headache and belly pain. °· You have fluid leaking from your vagina and belly pain. °· You keep having watery poop (diarrhea). °· Your belly pain does not go away after resting, or the pain gets worse. °MAKE SURE YOU:  °· Understand these instructions. °· Will watch your condition. °· Will get help right away if you are not doing well or get worse. °Document Released: 04/06/2009 Document Revised: 12/19/2012 Document Reviewed: 11/15/2012 °ExitCare® Patient Information ©2015 [employer]. This information is not intended to replace advice given to you by your health care provider. Make sure you discuss any questions you have with your health care provider. ° °

## 2015-01-19 LAB — OB RESULTS CONSOLE GBS: GBS: NEGATIVE

## 2015-02-02 ENCOUNTER — Encounter (HOSPITAL_COMMUNITY): Payer: Self-pay | Admitting: *Deleted

## 2015-02-02 ENCOUNTER — Inpatient Hospital Stay (HOSPITAL_COMMUNITY)
Admission: AD | Admit: 2015-02-02 | Discharge: 2015-02-02 | Disposition: A | Discharge status: Home / Self Care | Payer: Federal, State, Local not specified - PPO | Source: Ambulatory Visit | Attending: Obstetrics and Gynecology | Admitting: Obstetrics and Gynecology

## 2015-02-02 DIAGNOSIS — Z3A38 38 weeks gestation of pregnancy: Secondary | ICD-10-CM | POA: Diagnosis not present

## 2015-02-02 LAB — URINALYSIS, ROUTINE W REFLEX MICROSCOPIC
Bilirubin Urine: NEGATIVE
Glucose, UA: NEGATIVE mg/dL
Ketones, ur: NEGATIVE mg/dL
Leukocytes, UA: NEGATIVE
Nitrite: NEGATIVE
Protein, ur: NEGATIVE mg/dL
Specific Gravity, Urine: 1.01 (ref 1.005–1.030)
Urobilinogen, UA: 0.2 mg/dL (ref 0.0–1.0)
pH: 6 (ref 5.0–8.0)

## 2015-02-02 LAB — URINE MICROSCOPIC-ADD ON

## 2015-02-02 NOTE — Discharge Instructions (Signed)

## 2015-02-02 NOTE — MAU Note (Signed)
Came from MD office where membranes were stripped by MD, at 1230, states bleeding noted following exam.  Per MD stated more bleeding than she wanted to see.  Also, wanted kick count, states contractions approximately q3-5 mins., following exam.

## 2015-02-06 ENCOUNTER — Telehealth (HOSPITAL_COMMUNITY): Payer: Self-pay | Admitting: *Deleted

## 2015-02-06 NOTE — Telephone Encounter (Signed)
Preadmission screen  

## 2015-02-08 ENCOUNTER — Other Ambulatory Visit: Payer: Self-pay | Admitting: Obstetrics and Gynecology

## 2015-02-08 NOTE — H&P (Signed)
Kiara Hall is a 34 y.o. female G1P0 at 70 wks and 2 days presents for induction of labor due to term pregnancy with large for gestational age fetus. Fetus was 9 lbs +/- 13 oz on 02/02/2015. Pregnancy has been uncomplicated.. Pt has irregular contractions. +FM no lof .   Marland Kitchen History OB History    Gravida Para Term Preterm AB TAB SAB Ectopic Multiple Living   1         0     Past Medical History  Diagnosis Date  . Palpitation   . Anemia   . Anxiety    Past Surgical History  Procedure Laterality Date  . Tonsillectomy     Family History: family history includes Diabetes in her mother; Heart disease in her maternal uncle; Hypertension in her maternal grandfather and mother. Social History:  reports that she has never smoked. She does not have any smokeless tobacco history on file. She reports that she does not drink alcohol or use illicit drugs.   Prenatal Transfer Tool  Maternal Diabetes: No Genetic Screening: Normal Maternal Ultrasounds/Referrals: Normal Fetal Ultrasounds or other Referrals:  None Maternal Substance Abuse:  No Significant Maternal Medications:  None Significant Maternal Lab Results:  Lab values include: Group B Strep negative Other Comments:  None  Review of Systems  Constitutional: Negative.   HENT: Negative.   Eyes: Negative.   Respiratory: Negative.   Cardiovascular: Negative.   Gastrointestinal: Negative.   Genitourinary: Negative.   Musculoskeletal: Negative.   Skin: Negative.   Neurological: Negative.   Endo/Heme/Allergies: Negative.   Psychiatric/Behavioral: Negative.       Last menstrual period 05/10/2014. Maternal Exam:  Uterine Assessment: Contraction frequency is irregular.   Abdomen: Patient reports no abdominal tenderness. Fundal height is 41 cm .   Estimated fetal weight is 8 1/2 to 9 lbs by leopolds...Marland Kitchenon U/S 02/02/2015 EFW 9 lbs +/- 13 oz .   Fetal presentation: vertex  Introitus: Normal vulva. Normal vagina.    Physical Exam   Vitals reviewed. Constitutional: She is oriented to person, place, and time. She appears well-developed and well-nourished.  HENT:  Head: Normocephalic and atraumatic.  Eyes: Conjunctivae are normal. Pupils are equal, round, and reactive to light.  Neck: Normal range of motion. Neck supple.  Cardiovascular: Normal rate and regular rhythm.   Respiratory: Effort normal and breath sounds normal.  GI: Soft. Bowel sounds are normal.  Musculoskeletal: Normal range of motion. She exhibits edema.  Neurological: She is alert and oriented to person, place, and time. She has normal reflexes.  Skin: Skin is warm and dry.  Psychiatric: She has a normal mood and affect.   Cervix: 2/75/-2 on 02/02/2015  Prenatal labs: ABO, Rh: O/Positive/-- (03/03 0000) Antibody: Negative (03/03 0000) Rubella: Immune (03/03 0000) RPR: Nonreactive (03/03 0000)  HBsAg: Negative (03/03 0000)  HIV: Non-reactive (03/03 0000)  GBS: Negative (09/19 0000)   Assessment/Plan: 39 wks and 2 days large for gestational age fetus. Plan cytotec induction. .. Increased r/o cesarean section associated with induction was discussed with the patient and she accepts this risk.  CCOB covering until 7 am 02/09/2015   Jaaron Oleson J. 02/08/2015, 5:38 PM

## 2015-02-09 ENCOUNTER — Encounter (HOSPITAL_COMMUNITY): Payer: Self-pay

## 2015-02-09 ENCOUNTER — Inpatient Hospital Stay (HOSPITAL_COMMUNITY): Payer: Federal, State, Local not specified - PPO | Admitting: Anesthesiology

## 2015-02-09 ENCOUNTER — Inpatient Hospital Stay (HOSPITAL_COMMUNITY)
Admission: AD | Admit: 2015-02-09 | Discharge: 2015-02-12 | DRG: 765 | Disposition: A | Discharge status: Home / Self Care | Payer: Federal, State, Local not specified - PPO | Source: Ambulatory Visit | Attending: Obstetrics and Gynecology | Admitting: Obstetrics and Gynecology

## 2015-02-09 ENCOUNTER — Encounter (HOSPITAL_COMMUNITY)
Admission: AD | Disposition: A | Discharge status: Home / Self Care | Payer: Self-pay | Source: Ambulatory Visit | Attending: Obstetrics and Gynecology

## 2015-02-09 VITALS — BP 124/74 | HR 97 | Temp 98.7°F | Resp 20 | Ht 61.0 in | Wt 225.0 lb

## 2015-02-09 DIAGNOSIS — D649 Anemia, unspecified: Secondary | ICD-10-CM | POA: Diagnosis not present

## 2015-02-09 DIAGNOSIS — O3663X0 Maternal care for excessive fetal growth, third trimester, not applicable or unspecified: Secondary | ICD-10-CM | POA: Diagnosis present

## 2015-02-09 DIAGNOSIS — Z3A39 39 weeks gestation of pregnancy: Secondary | ICD-10-CM

## 2015-02-09 DIAGNOSIS — F419 Anxiety disorder, unspecified: Secondary | ICD-10-CM | POA: Diagnosis present

## 2015-02-09 DIAGNOSIS — K219 Gastro-esophageal reflux disease without esophagitis: Secondary | ICD-10-CM | POA: Diagnosis present

## 2015-02-09 DIAGNOSIS — O99344 Other mental disorders complicating childbirth: Secondary | ICD-10-CM | POA: Diagnosis present

## 2015-02-09 DIAGNOSIS — O9962 Diseases of the digestive system complicating childbirth: Secondary | ICD-10-CM | POA: Diagnosis present

## 2015-02-09 DIAGNOSIS — O99214 Obesity complicating childbirth: Secondary | ICD-10-CM | POA: Diagnosis present

## 2015-02-09 DIAGNOSIS — O9081 Anemia of the puerperium: Secondary | ICD-10-CM | POA: Diagnosis not present

## 2015-02-09 DIAGNOSIS — Z3490 Encounter for supervision of normal pregnancy, unspecified, unspecified trimester: Secondary | ICD-10-CM

## 2015-02-09 DIAGNOSIS — O339 Maternal care for disproportion, unspecified: Secondary | ICD-10-CM | POA: Diagnosis present

## 2015-02-09 DIAGNOSIS — Z98891 History of uterine scar from previous surgery: Secondary | ICD-10-CM

## 2015-02-09 DIAGNOSIS — Z6841 Body Mass Index (BMI) 40.0 and over, adult: Secondary | ICD-10-CM | POA: Diagnosis not present

## 2015-02-09 LAB — CBC
HCT: 35.7 % — ABNORMAL LOW (ref 36.0–46.0)
Hemoglobin: 11.9 g/dL — ABNORMAL LOW (ref 12.0–15.0)
MCH: 26.9 pg (ref 26.0–34.0)
MCHC: 33.3 g/dL (ref 30.0–36.0)
MCV: 80.8 fL (ref 78.0–100.0)
Platelets: 150 10*3/uL (ref 150–400)
RBC: 4.42 MIL/uL (ref 3.87–5.11)
RDW: 16.1 % — ABNORMAL HIGH (ref 11.5–15.5)
WBC: 6 10*3/uL (ref 4.0–10.5)

## 2015-02-09 LAB — TYPE AND SCREEN
ABO/RH(D): O POS
Antibody Screen: NEGATIVE

## 2015-02-09 LAB — RPR: RPR Ser Ql: NONREACTIVE

## 2015-02-09 SURGERY — Surgical Case
Anesthesia: Epidural

## 2015-02-09 MED ORDER — NALBUPHINE HCL 10 MG/ML IJ SOLN: 5.0000 mg | Freq: Once | INTRAMUSCULAR | Status: DC | PRN

## 2015-02-09 MED ORDER — LANOLIN HYDROUS EX OINT: 1.0000 "application " | TOPICAL_OINTMENT | CUTANEOUS | Status: DC | PRN

## 2015-02-09 MED ORDER — ACETAMINOPHEN 500 MG PO TABS
1000.0000 mg | ORAL_TABLET | Freq: Four times a day (QID) | ORAL | Status: AC
  Administered 2015-02-10 (×3): 1000 mg via ORAL
  Filled 2015-02-09 (×3): qty 2

## 2015-02-09 MED ORDER — ACETAMINOPHEN 325 MG PO TABS: 650.0000 mg | ORAL_TABLET | ORAL | Status: DC | PRN

## 2015-02-09 MED ORDER — MORPHINE SULFATE (PF) 0.5 MG/ML IJ SOLN
INTRAMUSCULAR | Status: DC | PRN
  Administered 2015-02-09: 4 mg via EPIDURAL
  Administered 2015-02-09: 1 mg via INTRAVENOUS

## 2015-02-09 MED ORDER — CEFAZOLIN SODIUM-DEXTROSE 2-3 GM-% IV SOLR
2.0000 g | Freq: Once | INTRAVENOUS | Status: AC
  Administered 2015-02-09: 2 g via INTRAVENOUS
  Filled 2015-02-09: qty 50

## 2015-02-09 MED ORDER — MEPERIDINE HCL 25 MG/ML IJ SOLN: 6.2500 mg | INTRAMUSCULAR | Status: DC | PRN

## 2015-02-09 MED ORDER — NALOXONE HCL 0.4 MG/ML IJ SOLN: 0.4000 mg | INTRAMUSCULAR | Status: DC | PRN

## 2015-02-09 MED ORDER — ONDANSETRON HCL 4 MG/2ML IJ SOLN: 4.0000 mg | Freq: Three times a day (TID) | INTRAMUSCULAR | Status: DC | PRN

## 2015-02-09 MED ORDER — OXYTOCIN 40 UNITS IN LACTATED RINGERS INFUSION - SIMPLE MED
1.0000 m[IU]/min | INTRAVENOUS | Status: DC
  Administered 2015-02-09: 2 m[IU]/min via INTRAVENOUS
  Filled 2015-02-09: qty 1000

## 2015-02-09 MED ORDER — FENTANYL CITRATE (PF) 100 MCG/2ML IJ SOLN
25.0000 ug | INTRAMUSCULAR | Status: DC | PRN
  Administered 2015-02-09: 25 ug via INTRAVENOUS

## 2015-02-09 MED ORDER — ONDANSETRON HCL 4 MG/2ML IJ SOLN
INTRAMUSCULAR | Status: AC
  Filled 2015-02-09: qty 2

## 2015-02-09 MED ORDER — FENTANYL 2.5 MCG/ML BUPIVACAINE 1/10 % EPIDURAL INFUSION (WH - ANES)
14.0000 mL/h | INTRAMUSCULAR | Status: DC | PRN
  Administered 2015-02-09: 14 mL/h via EPIDURAL
  Administered 2015-02-09: 12.5 mL/h via EPIDURAL
  Filled 2015-02-09: qty 125

## 2015-02-09 MED ORDER — DIPHENHYDRAMINE HCL 50 MG/ML IJ SOLN
12.5000 mg | INTRAMUSCULAR | Status: DC | PRN
Start: 2015-02-09 — End: 2015-02-09

## 2015-02-09 MED ORDER — DEXTROSE 5 % IV SOLN
1.0000 ug/kg/h | INTRAVENOUS | Status: DC | PRN
  Filled 2015-02-09: qty 2

## 2015-02-09 MED ORDER — ONDANSETRON HCL 4 MG/2ML IJ SOLN: 4.0000 mg | Freq: Four times a day (QID) | INTRAMUSCULAR | Status: DC | PRN

## 2015-02-09 MED ORDER — NALBUPHINE HCL 10 MG/ML IJ SOLN: 5.0000 mg | INTRAMUSCULAR | Status: DC | PRN

## 2015-02-09 MED ORDER — SCOPOLAMINE 1 MG/3DAYS TD PT72
MEDICATED_PATCH | TRANSDERMAL | Status: AC
Start: 2015-02-09 — End: 2015-02-09
  Filled 2015-02-09: qty 1

## 2015-02-09 MED ORDER — SODIUM CHLORIDE 0.9 % IJ SOLN: 3.0000 mL | INTRAMUSCULAR | Status: DC | PRN

## 2015-02-09 MED ORDER — SODIUM BICARBONATE 8.4 % IV SOLN
INTRAVENOUS | Status: DC | PRN
  Administered 2015-02-09 (×2): 5 mL via EPIDURAL

## 2015-02-09 MED ORDER — OXYCODONE-ACETAMINOPHEN 5-325 MG PO TABS: 2.0000 | ORAL_TABLET | ORAL | Status: DC | PRN

## 2015-02-09 MED ORDER — FENTANYL CITRATE (PF) 100 MCG/2ML IJ SOLN
50.0000 ug | INTRAMUSCULAR | Status: DC | PRN
  Administered 2015-02-09: 100 ug via INTRAVENOUS
  Filled 2015-02-09: qty 2

## 2015-02-09 MED ORDER — LACTATED RINGERS IV SOLN
INTRAVENOUS | Status: DC
  Administered 2015-02-10: 03:00:00 via INTRAVENOUS

## 2015-02-09 MED ORDER — LACTATED RINGERS IV SOLN
INTRAVENOUS | Status: DC
  Administered 2015-02-09 (×3): via INTRAVENOUS

## 2015-02-09 MED ORDER — LIDOCAINE-EPINEPHRINE (PF) 2 %-1:200000 IJ SOLN
INTRAMUSCULAR | Status: AC
  Filled 2015-02-09: qty 20

## 2015-02-09 MED ORDER — HYDROXYZINE HCL 50 MG PO TABS: 50.0000 mg | ORAL_TABLET | Freq: Four times a day (QID) | ORAL | Status: DC | PRN

## 2015-02-09 MED ORDER — PHENYLEPHRINE 40 MCG/ML (10ML) SYRINGE FOR IV PUSH (FOR BLOOD PRESSURE SUPPORT)
80.0000 ug | PREFILLED_SYRINGE | INTRAVENOUS | Status: DC | PRN
  Filled 2015-02-09: qty 20

## 2015-02-09 MED ORDER — FENTANYL CITRATE (PF) 100 MCG/2ML IJ SOLN
INTRAMUSCULAR | Status: AC
  Filled 2015-02-09: qty 2

## 2015-02-09 MED ORDER — MISOPROSTOL 25 MCG QUARTER TABLET
25.0000 ug | ORAL_TABLET | ORAL | Status: DC | PRN
  Administered 2015-02-09: 25 ug via VAGINAL
  Filled 2015-02-09: qty 0.25

## 2015-02-09 MED ORDER — SIMETHICONE 80 MG PO CHEW
80.0000 mg | CHEWABLE_TABLET | ORAL | Status: DC | PRN
  Administered 2015-02-11: 80 mg via ORAL

## 2015-02-09 MED ORDER — OXYTOCIN 10 UNIT/ML IJ SOLN
INTRAMUSCULAR | Status: AC
  Filled 2015-02-09: qty 4

## 2015-02-09 MED ORDER — SCOPOLAMINE 1 MG/3DAYS TD PT72
MEDICATED_PATCH | TRANSDERMAL | Status: DC | PRN
  Administered 2015-02-09: 1 via TRANSDERMAL

## 2015-02-09 MED ORDER — ACETAMINOPHEN 325 MG PO TABS
650.0000 mg | ORAL_TABLET | ORAL | Status: DC | PRN
  Administered 2015-02-11 – 2015-02-12 (×4): 650 mg via ORAL
  Filled 2015-02-09 (×4): qty 2

## 2015-02-09 MED ORDER — OXYTOCIN 40 UNITS IN LACTATED RINGERS INFUSION - SIMPLE MED: 62.5000 mL/h | INTRAVENOUS | Status: DC

## 2015-02-09 MED ORDER — LACTATED RINGERS IV SOLN
500.0000 mL | INTRAVENOUS | Status: DC | PRN
  Administered 2015-02-09: 1000 mL via INTRAVENOUS
  Administered 2015-02-09: 500 mL via INTRAVENOUS

## 2015-02-09 MED ORDER — PHENYLEPHRINE HCL 10 MG/ML IJ SOLN
INTRAMUSCULAR | Status: DC | PRN
  Administered 2015-02-09: 40 ug via INTRAVENOUS
  Administered 2015-02-09: 80 ug via INTRAVENOUS

## 2015-02-09 MED ORDER — LIDOCAINE HCL (PF) 1 % IJ SOLN
INTRAMUSCULAR | Status: DC | PRN
  Administered 2015-02-09 (×2): 4 mL via EPIDURAL

## 2015-02-09 MED ORDER — OXYCODONE-ACETAMINOPHEN 5-325 MG PO TABS
2.0000 | ORAL_TABLET | ORAL | Status: DC | PRN
Start: 2015-02-09 — End: 2015-02-12

## 2015-02-09 MED ORDER — DIPHENHYDRAMINE HCL 50 MG/ML IJ SOLN: 12.5000 mg | INTRAMUSCULAR | Status: DC | PRN

## 2015-02-09 MED ORDER — LACTATED RINGERS IV SOLN
INTRAVENOUS | Status: DC | PRN
  Administered 2015-02-09 (×3): via INTRAVENOUS

## 2015-02-09 MED ORDER — OXYTOCIN 40 UNITS IN LACTATED RINGERS INFUSION - SIMPLE MED: 62.5000 mL/h | INTRAVENOUS | Status: AC

## 2015-02-09 MED ORDER — MORPHINE SULFATE (PF) 0.5 MG/ML IJ SOLN
INTRAMUSCULAR | Status: AC
  Filled 2015-02-09: qty 100

## 2015-02-09 MED ORDER — OXYCODONE-ACETAMINOPHEN 5-325 MG PO TABS
1.0000 | ORAL_TABLET | ORAL | Status: DC | PRN
  Filled 2015-02-09: qty 1

## 2015-02-09 MED ORDER — LACTATED RINGERS IV SOLN
INTRAVENOUS | Status: DC | PRN
  Administered 2015-02-09: 20:00:00 via INTRAVENOUS

## 2015-02-09 MED ORDER — ACETAMINOPHEN 500 MG PO TABS: 1000.0000 mg | ORAL_TABLET | Freq: Four times a day (QID) | ORAL | Status: DC

## 2015-02-09 MED ORDER — TERBUTALINE SULFATE 1 MG/ML IJ SOLN: 0.2500 mg | Freq: Once | INTRAMUSCULAR | Status: DC | PRN

## 2015-02-09 MED ORDER — SIMETHICONE 80 MG PO CHEW: 80.0000 mg | CHEWABLE_TABLET | ORAL | Status: DC

## 2015-02-09 MED ORDER — METHYLERGONOVINE MALEATE 0.2 MG PO TABS: 0.2000 mg | ORAL_TABLET | ORAL | Status: DC | PRN

## 2015-02-09 MED ORDER — NALOXONE HCL 1 MG/ML IJ SOLN: 1.0000 ug/kg/h | INTRAVENOUS | Status: DC | PRN

## 2015-02-09 MED ORDER — MEPERIDINE HCL 25 MG/ML IJ SOLN
INTRAMUSCULAR | Status: AC
  Filled 2015-02-09: qty 1

## 2015-02-09 MED ORDER — DIBUCAINE 1 % RE OINT
1.0000 "application " | TOPICAL_OINTMENT | RECTAL | Status: DC | PRN
  Filled 2015-02-09: qty 28

## 2015-02-09 MED ORDER — SCOPOLAMINE 1 MG/3DAYS TD PT72: 1.0000 | MEDICATED_PATCH | Freq: Once | TRANSDERMAL | Status: DC

## 2015-02-09 MED ORDER — LIDOCAINE HCL (PF) 1 % IJ SOLN: 30.0000 mL | INTRAMUSCULAR | Status: DC | PRN

## 2015-02-09 MED ORDER — DIPHENHYDRAMINE HCL 25 MG PO CAPS: 25.0000 mg | ORAL_CAPSULE | Freq: Four times a day (QID) | ORAL | Status: DC | PRN

## 2015-02-09 MED ORDER — MENTHOL 3 MG MT LOZG: 1.0000 | LOZENGE | OROMUCOSAL | Status: DC | PRN

## 2015-02-09 MED ORDER — DIPHENHYDRAMINE HCL 25 MG PO CAPS: 25.0000 mg | ORAL_CAPSULE | ORAL | Status: DC | PRN

## 2015-02-09 MED ORDER — ONDANSETRON HCL 4 MG/2ML IJ SOLN
INTRAMUSCULAR | Status: DC | PRN
  Administered 2015-02-09: 4 mg via INTRAVENOUS

## 2015-02-09 MED ORDER — SODIUM BICARBONATE 8.4 % IV SOLN
INTRAVENOUS | Status: AC
  Filled 2015-02-09: qty 50

## 2015-02-09 MED ORDER — FERROUS SULFATE 325 (65 FE) MG PO TABS
325.0000 mg | ORAL_TABLET | Freq: Two times a day (BID) | ORAL | Status: DC
  Administered 2015-02-10 – 2015-02-12 (×3): 325 mg via ORAL
  Filled 2015-02-09 (×4): qty 1

## 2015-02-09 MED ORDER — PRENATAL MULTIVITAMIN CH
1.0000 | ORAL_TABLET | Freq: Every day | ORAL | Status: DC
Start: 2015-02-10 — End: 2015-02-12
  Administered 2015-02-10: 1 via ORAL
  Filled 2015-02-09 (×3): qty 1

## 2015-02-09 MED ORDER — ZOLPIDEM TARTRATE 5 MG PO TABS: 5.0000 mg | ORAL_TABLET | Freq: Every evening | ORAL | Status: DC | PRN

## 2015-02-09 MED ORDER — CITRIC ACID-SODIUM CITRATE 334-500 MG/5ML PO SOLN
30.0000 mL | ORAL | Status: DC | PRN
  Administered 2015-02-09: 30 mL via ORAL
  Filled 2015-02-09: qty 15

## 2015-02-09 MED ORDER — IBUPROFEN 600 MG PO TABS
600.0000 mg | ORAL_TABLET | Freq: Four times a day (QID) | ORAL | Status: DC
  Administered 2015-02-10 – 2015-02-12 (×9): 600 mg via ORAL
  Filled 2015-02-09 (×10): qty 1

## 2015-02-09 MED ORDER — SCOPOLAMINE 1 MG/3DAYS TD PT72
1.0000 | MEDICATED_PATCH | Freq: Once | TRANSDERMAL | Status: DC
  Filled 2015-02-09: qty 1

## 2015-02-09 MED ORDER — PROMETHAZINE HCL 25 MG/ML IJ SOLN: 6.2500 mg | INTRAMUSCULAR | Status: DC | PRN

## 2015-02-09 MED ORDER — SIMETHICONE 80 MG PO CHEW
80.0000 mg | CHEWABLE_TABLET | Freq: Three times a day (TID) | ORAL | Status: DC
Start: 2015-02-10 — End: 2015-02-12
  Administered 2015-02-10 – 2015-02-12 (×4): 80 mg via ORAL
  Filled 2015-02-09 (×5): qty 1

## 2015-02-09 MED ORDER — MEPERIDINE HCL 25 MG/ML IJ SOLN
INTRAMUSCULAR | Status: DC | PRN
  Administered 2015-02-09 (×2): 12.5 mg via INTRAVENOUS

## 2015-02-09 MED ORDER — EPHEDRINE 5 MG/ML INJ: 10.0000 mg | INTRAVENOUS | Status: DC | PRN

## 2015-02-09 MED ORDER — LACTATED RINGERS IV SOLN
40.0000 [IU] | INTRAVENOUS | Status: DC | PRN
  Administered 2015-02-09: 40 [IU] via INTRAVENOUS

## 2015-02-09 MED ORDER — PHENYLEPHRINE 40 MCG/ML (10ML) SYRINGE FOR IV PUSH (FOR BLOOD PRESSURE SUPPORT)
PREFILLED_SYRINGE | INTRAVENOUS | Status: AC
  Filled 2015-02-09: qty 10

## 2015-02-09 MED ORDER — WITCH HAZEL-GLYCERIN EX PADS: 1.0000 "application " | MEDICATED_PAD | CUTANEOUS | Status: DC | PRN

## 2015-02-09 MED ORDER — SENNOSIDES-DOCUSATE SODIUM 8.6-50 MG PO TABS
2.0000 | ORAL_TABLET | ORAL | Status: DC
  Administered 2015-02-10 – 2015-02-12 (×3): 2 via ORAL
  Filled 2015-02-09 (×3): qty 2

## 2015-02-09 MED ORDER — OXYCODONE-ACETAMINOPHEN 5-325 MG PO TABS: 1.0000 | ORAL_TABLET | ORAL | Status: DC | PRN

## 2015-02-09 MED ORDER — METHYLERGONOVINE MALEATE 0.2 MG/ML IJ SOLN: 0.2000 mg | INTRAMUSCULAR | Status: DC | PRN

## 2015-02-09 MED ORDER — OXYTOCIN BOLUS FROM INFUSION: 500.0000 mL | INTRAVENOUS | Status: DC

## 2015-02-09 SURGICAL SUPPLY — 40 items
APL SKNCLS STERI-STRIP NONHPOA (GAUZE/BANDAGES/DRESSINGS) ×1
BARRIER ADHS 3X4 INTERCEED (GAUZE/BANDAGES/DRESSINGS) ×1 IMPLANT
BENZOIN TINCTURE PRP APPL 2/3 (GAUZE/BANDAGES/DRESSINGS) ×2 IMPLANT
BRR ADH 4X3 ABS CNTRL BYND (GAUZE/BANDAGES/DRESSINGS) ×1
CLAMP CORD UMBIL (MISCELLANEOUS) IMPLANT
CLOTH BEACON ORANGE TIMEOUT ST (SAFETY) ×2 IMPLANT
CONTAINER PREFILL 10% NBF 15ML (MISCELLANEOUS) IMPLANT
DRAPE SHEET LG 3/4 BI-LAMINATE (DRAPES) IMPLANT
DRSG OPSITE POSTOP 4X10 (GAUZE/BANDAGES/DRESSINGS) ×2 IMPLANT
DURAPREP 26ML APPLICATOR (WOUND CARE) ×2 IMPLANT
ELECT REM PT RETURN 9FT ADLT (ELECTROSURGICAL) ×2
ELECTRODE REM PT RTRN 9FT ADLT (ELECTROSURGICAL) ×1 IMPLANT
EXTRACTOR VACUUM M CUP 4 TUBE (SUCTIONS) IMPLANT
GLOVE BIOGEL M 6.5 STRL (GLOVE) ×4 IMPLANT
GLOVE BIOGEL PI IND STRL 6.5 (GLOVE) ×1 IMPLANT
GLOVE BIOGEL PI INDICATOR 6.5 (GLOVE) ×1
GOWN STRL REUS W/TWL LRG LVL3 (GOWN DISPOSABLE) ×6 IMPLANT
KIT ABG SYR 3ML LUER SLIP (SYRINGE) IMPLANT
NDL HYPO 25X5/8 SAFETYGLIDE (NEEDLE) IMPLANT
NEEDLE HYPO 25X5/8 SAFETYGLIDE (NEEDLE) IMPLANT
NS IRRIG 1000ML POUR BTL (IV SOLUTION) ×2 IMPLANT
PACK C SECTION WH (CUSTOM PROCEDURE TRAY) ×2 IMPLANT
PAD OB MATERNITY 4.3X12.25 (PERSONAL CARE ITEMS) ×2 IMPLANT
PENCIL SMOKE EVAC W/HOLSTER (ELECTROSURGICAL) ×2 IMPLANT
RTRCTR C-SECT PINK 25CM LRG (MISCELLANEOUS) IMPLANT
RTRCTR C-SECT PINK 34CM XLRG (MISCELLANEOUS) IMPLANT
STRIP CLOSURE SKIN 1/2X4 (GAUZE/BANDAGES/DRESSINGS) ×2 IMPLANT
SUT PDS AB 0 CT1 27 (SUTURE) ×4 IMPLANT
SUT PLAIN 0 NONE (SUTURE) IMPLANT
SUT VIC AB 0 CTX 36 (SUTURE) ×6
SUT VIC AB 0 CTX36XBRD ANBCTRL (SUTURE) ×3 IMPLANT
SUT VIC AB 2-0 CT1 27 (SUTURE) ×2
SUT VIC AB 2-0 CT1 TAPERPNT 27 (SUTURE) ×1 IMPLANT
SUT VIC AB 2-0 SH 27 (SUTURE) ×2
SUT VIC AB 2-0 SH 27XBRD (SUTURE) IMPLANT
SUT VIC AB 3-0 SH 27 (SUTURE)
SUT VIC AB 3-0 SH 27X BRD (SUTURE) IMPLANT
SUT VIC AB 4-0 KS 27 (SUTURE) ×2 IMPLANT
TOWEL OR 17X24 6PK STRL BLUE (TOWEL DISPOSABLE) ×2 IMPLANT
TRAY FOLEY CATH SILVER 14FR (SET/KITS/TRAYS/PACK) ×2 IMPLANT

## 2015-02-09 NOTE — Op Note (Signed)
Cesarean Section Procedure Note  Indications: cephalo-pelvic disproportion and non-reassuring fetal status  Pre-operative Diagnosis: 39 week 2 day pregnancy.  Post-operative Diagnosis: same  Surgeon: Jessee Avers.   Assistants: Sherre Scarlet CNM  Anesthesia: Epidural anesthesia  ASA Class: 2   Procedure Details   The patient was seen in the Holding Room. The risks, benefits, complications, treatment options, and expected outcomes were discussed with the patient.  The patient concurred with the proposed plan, giving informed consent.  The site of surgery properly noted/marked. The patient was taken to Operating Room # 9, identified as Kiara Hall and the procedure verified as C-Section Delivery. A Time Out was held and the above information confirmed.  After induction of anesthesia, the patient was draped and prepped in the usual sterile manner. A Pfannenstiel incision was made and carried down through the subcutaneous tissue to the fascia. Fascial incision was made and extended transversely. The fascia was separated from the underlying rectus tissue superiorly and inferiorly. The peritoneum was identified and entered. Peritoneal incision was extended longitudinally. The utero-vesical peritoneal reflection was incised transversely and the bladder flap was bluntly freed from the lower uterine segment. A low transverse uterine incision was made. Delivered from cephalic presentation/ occiput posterior  was a   Female with Apgar scores of 8 at one minute and 9 at five minutes. After the umbilical cord was clamped and cut cord blood was obtained for evaluation. The placenta was removed intact and appeared normal. The uterine outline, tubes and ovaries appeared normal. The uterine incision was closed with running locked sutures of 0 vicryl .Marland Kitchen A second layer of 0 vicryl was used to imbricate the incision .Marland Kitchen Hemostasis was observed. Lavage was carried out until clear. Interseed was placed along the  uterine incision.  The fascia was then reapproximated with running sutures of 0 pds. The skin was reapproximated with 4-0 vicryl .  Instrument, sponge, and needle counts were correct prior the abdominal closure and at the conclusion of the case.   Findings:  female infant in the cephalic presentation. Occiput posterior.. Normal fallopian tubes and ovaries.   Estimated Blood Loss:  900 cc         Drains: None         Total IV Fluids:  Per anesthesia ml         Specimens: Placenta and sent to labor and delivery            Implants: None         Complications:  None; patient tolerated the procedure well.         Disposition: PACU - hemodynamically stable.         Condition: stable  Attending Attestation: I performed the procedure.

## 2015-02-09 NOTE — Progress Notes (Signed)
Kiara Hall is a 34 y.o. G1P0 at [redacted]w[redacted]d  admitted for induction of labor due to Elective at term.  Subjective:  pt reports contractions are an 8 out of 10.Marland Kitchen No vaginal bleeding.   Objective: BP 125/62 mmHg  Pulse 96  Temp(Src) 98.1 F (36.7 C) (Oral)  Resp 16  Ht  (1.549 m)  Wt 102.059 kg (225 lb)  BMI 42.54 kg/m2  LMP 05/10/2014 (Exact Date)      FHT:  FHR: 140 bpm, variability: moderate,  accelerations:  Present,  decelerations:  Absent UC:   regular, every 2-3 minutes SVE:   Dilation: 3.5 Effacement (%): 50 Station: -3 Exam by:: Dr. Richardson Dopp  Labs: Lab Results  Component Value Date   WBC 6.0 02/09/2015   HGB 11.9* 02/09/2015   HCT 35.7* 02/09/2015   MCV 80.8 02/09/2015   PLT 150 02/09/2015    Assessment / Plan: 39 wks and 2 days for indicution ' Category 1 FHR tracing LGA fetus.... Pelvis feels adequate  Continue pitocin Epidural for pain controll Will reevalute cervix at 5 pm consider AROM  At that time if appropriate.  COLE,TARA J. 02/09/2015, 2:36 PM

## 2015-02-09 NOTE — Progress Notes (Signed)
Kiara Hall is a 34 y.o. G1P0 at [redacted]w[redacted]d  Subjective:  patient is comfortable with her epidural . SROM clear fluid at 320 pm   Objective: BP 107/63 mmHg  Pulse 109  Temp(Src) 97.5 F (36.4 C) (Oral)  Resp 18  Ht  (1.549 m)  Wt 102.059 kg (225 lb)  BMI 42.54 kg/m2  LMP 05/10/2014 (Exact Date)      FHT:  FHR: 140 bpm, variability: moderate,  accelerations:  Present,  decelerations:  Absent UC:   regular, every 1-4 minutes SVE: 4.5/50/-3  IUPC placed  Labs: Lab Results  Component Value Date   WBC 6.0 02/09/2015   HGB 11.9* 02/09/2015   HCT 35.7* 02/09/2015   MCV 80.8 02/09/2015   PLT 150 02/09/2015    Assessment / Plan: 39 wks and 2 days with LGA Fetus  For induction. Continue Pitocin  Will monitor closely   Arienne Gartin J. 02/09/2015, 5:40 PM

## 2015-02-09 NOTE — Progress Notes (Addendum)
Kiara Hall, [id number]   Subjective -In room to meet the acquaintance of Kiara Hall, 34 y.o. G1P0 who presents for IOL secondary to LGA.  Ms. Kiara Hall is a patient of [facility] and is under the care of Dr. Delrae Sawyers.  Patient is GBS negative and has had no significant prenatal history.  Patient reports some anxiety with hospital stay and induction process.  Patient reports good fetal movement and reports some contractions, while denying LOF and VB.    Objective Filed Vitals:   02/09/15 0037  BP: 145/77  Pulse: 114  Temp: 98 F (36.7 C)  Resp: 18        Physical Exam  Constitutional: She is oriented to person, place, and time and well-developed, well-nourished, and in no distress. No distress.  HENT:  Head: Normocephalic and atraumatic.  Eyes: EOM are normal. Pupils are equal, round, and reactive to light.  Neck: Normal range of motion.  Cardiovascular: Normal rate.   Abdominal: Soft.  Genitourinary: Vaginal discharge found.  Musculoskeletal: Normal range of motion.  Neurological: She is alert and oriented to person, place, and time.  Skin: Skin is warm and dry.  Psychiatric: Affect and judgment normal.  Vitals reviewed.   YQM:VHQIONGE: 2 Effacement (%): 50 Station: -3 Presentation: Vertex Exam by:: Gerrit Heck, CNM  Pelvis: -Proven:No -Adequate: Yes Leopolds: -Position:Vertex -EFW:9lbs +/-13 oz by 38wk [place]  Monitoring: FHR: 150 bpm, Mod Var, -Decels, +Accels UC:  Occasionally graphed, palpates mild  Induction/Augmentation Agent: Pitocin: None Cytotec: 1st Dose at 0130 Membranes: Intact  Assessment IUP at 39.2wks Cat I FT Bishop Score: 4 Cervical Ripening LGA GBS Negative Anxiety  Plan -Discussed r/b of induction including fetal distress, serial induction, pain, and increased risk of c/s delivery -Discussed induction methods including cervical ripening agents and pitocin -Discussed appropriate time to get epidural and other pain medications  available -Questions and concerns addressed -Patient declines sleep aide and anxiety medication -Nurse instructed to perform next check at 0530 and give pitocin if appropriate -Pitocin ordered -Continue other mgmt as ordered by Dr. Kris Mouton, Sui Kasparek LYNN, CNM 02/09/2015, 1:47 AM   Addendum ([id number]) S/O: Nurse call requests assistance.  In room to assess.  FHR in 90s and gradually increasing. O2 on and fluid bolus infusing A: S/P Cytotec Cat II FT P: Give bolus Titrate off O2 Continue to monitor Questions and concerns addressed, reassurances given  Hisako Bugh LYNN MSN, CNM 2:46 AM

## 2015-02-09 NOTE — Progress Notes (Signed)
Kiara Hall MRN: [id number]  Subjective: -Nurse reports patient experiencing immense anxiety, after fetal deceleration episodes, and requesting primary c/s. In room to assess and discuss POC. Patient reports perception of contractions and anxiety related to fetal and personal safety.  States she does not currently desire a c/s, but rather not labor for extended periods time.  Patient also expresses anxiety regarding pain mgmt and requests epidural at current, but states "I don't want to slow things down."  Objective: BP 119/51 mmHg  Pulse 99  Temp(Src) 98.1 F (36.7 C) (Oral)  Resp 18  Ht  (1.549 m)  Wt 102.059 kg (225 lb)  BMI 42.54 kg/m2  LMP 05/10/2014 (Exact Date)     FHT: 135 bpm, Mod Var, -Decels, +Accels UC: Palpates mild to moderate   SVE:   Dilation: 3 Effacement (%): 50 Station: -3 Exam by:: Gerrit Heck, CNM Membranes: Intact Pitocin:Initiated  Assessment:  IUP at 39.2wks Cat I FT  Labor Induction Bishop Score: 6  Plan: -Reassurances given regarding goal of healthy mom and baby -Informed that reasons for fetal decelerations are usually not truly known unless obvious at delivery: meconium, nuchal/body cord -Discussed possibility of still ending up with c/s for delivery, but encouraged to attempt vaginal delivery at current -Patient agreeable to continue induction process  -No other questions or concerns -Start pitocin at 55mUn/min -Dr. Delrae Sawyers updated and agrees to continue as ordered  [name], [name] [name], CNM 02/09/2015, 6:25 AM

## 2015-02-09 NOTE — Anesthesia Preprocedure Evaluation (Signed)
Anesthesia Evaluation  Patient identified by MRN, date of birth, ID band Patient awake    Reviewed: Allergy & Precautions, Patient's Chart, lab work & pertinent test results  Airway Mallampati: III  TM Distance: >3 FB Neck ROM: Full    Dental no notable dental hx. (+) Teeth Intact   Pulmonary neg pulmonary ROS,    Pulmonary exam normal breath sounds clear to auscultation       Cardiovascular negative cardio ROS Normal cardiovascular exam Rhythm:Regular Rate:Normal     Neuro/Psych  Headaches, PSYCHIATRIC DISORDERS Anxiety Depression negative psych ROS   GI/Hepatic Neg liver ROS, GERD  Medicated and Controlled,  Endo/Other  Morbid obesity  Renal/GU negative Renal ROS     Musculoskeletal negative musculoskeletal ROS (+)   Abdominal (+) + obese,   Peds  Hematology  (+) anemia ,   Anesthesia Other Findings   Reproductive/Obstetrics                             Anesthesia Physical Anesthesia Plan  ASA: III  Anesthesia Plan: Epidural   Post-op Pain Management:    Induction:   Airway Management Planned: Natural Airway  Additional Equipment:   Intra-op Plan:   Post-operative Plan:   Informed Consent: I have reviewed the patients History and Physical, chart, labs and discussed the procedure including the risks, benefits and alternatives for the proposed anesthesia with the patient or authorized representative who has indicated his/her understanding and acceptance.     Plan Discussed with: Anesthesiologist  Anesthesia Plan Comments:         Anesthesia Quick Evaluation

## 2015-02-09 NOTE — Anesthesia Procedure Notes (Signed)
Epidural Patient location during procedure: OB Start time: 02/09/2015 1:53 PM  Staffing Anesthesiologist: Mal Amabile Performed by: anesthesiologist   Preanesthetic Checklist Completed: patient identified, site marked, surgical consent, pre-op evaluation, timeout performed, IV checked, risks and benefits discussed and monitors and equipment checked  Epidural Patient position: sitting Prep: site prepped and draped and DuraPrep Patient monitoring: continuous pulse ox and blood pressure Approach: midline Location: L3-L4 Injection technique: LOR air  Needle:  Needle type: Tuohy  Needle gauge: 17 G Needle length: 9 cm and 9 Needle insertion depth: 7 cm Catheter type: closed end flexible Catheter size: 19 Gauge Catheter at skin depth: 12 cm Test dose: negative and Other  Assessment Events: blood not aspirated, injection not painful, no injection resistance, negative IV test and no paresthesia  Additional Notes Patient identified. Risks and benefits discussed including failed block, incomplete  Pain control, post dural puncture headache, nerve damage, paralysis, blood pressure Changes, nausea, vomiting, reactions to medications-both toxic and allergic and post Partum back pain. All questions were answered. Patient expressed understanding and wished to proceed. Sterile technique was used throughout procedure. Epidural site was Dressed with sterile barrier dressing. No paresthesias, signs of intravascular injection Or signs of intrathecal spread were encountered.  Patient was more comfortable after the epidural was dosed. Please see RN's note for documentation of vital signs and FHR which are stable.

## 2015-02-09 NOTE — Progress Notes (Signed)
Kiara Hall is a 34 y.o. G1P0 at [redacted]w[redacted]d by  Subjective: In to assess patient due to call from nurse reporting late decelerations and prolonged variable.. Pt with rectal pressure...  Pitocin was discontinued... On arrival  fhr baseline 140 moderate variability and accelerations   Objective: BP 138/75 mmHg  Pulse 117  Temp(Src) 98 F (36.7 C) (Oral)  Resp 20  Ht  (1.549 m)  Wt 102.059 kg (225 lb)  BMI 42.54 kg/m2  LMP 05/10/2014 (Exact Date)      FHT:  FHR: 140 bpm, variability: moderate,  accelerations:  Present,  decelerations:  Absent UC:   regular, every 1-3 minutes prior to pitocin however irregular with cessation of pitocin  SVE:   Dilation: 5.5 Effacement (%): 50 Station: -3 Exam by:: Dr. Richardson Dopp  Labs: Lab Results  Component Value Date   WBC 6.0 02/09/2015   HGB 11.9* 02/09/2015   HCT 35.7* 02/09/2015   MCV 80.8 02/09/2015   PLT 150 02/09/2015    Assessment / Plan: 39 wks and 2 days with nonreassuring fetal heart rate.Marland Kitchen Resolved with cessation of pitocin  Cervix 5.5  Cm with significant caput at -3 station.  Fetus is large for gestational age.  Recommend cesarean section due to nonreassuring fetal heart rate and suspected CPD.  R/b/a of cesarean section discussed with the patient including but not limited to infection / bleeding damage to bowel bladder baby with the need for further surgery . R/o transfusion HIV/ Hep B&C discussed. Pt voiced understanding and desires to proceed with cesarean section   Alois Mincer J. 02/09/2015, 7:18 PM

## 2015-02-09 NOTE — Anesthesia Postprocedure Evaluation (Signed)
  Anesthesia Post-op Note  Patient: Kiara Hall  Procedure(s) Performed: Procedure(s): CESAREAN SECTION (N/A)  Patient Location: PACU  Anesthesia Type:Epidural  Level of Consciousness: awake, alert  and oriented  Airway and Oxygen Therapy: Patient Spontanous Breathing  Post-op Pain: mild  Post-op Assessment: Post-op Vital signs reviewed, Patient's Cardiovascular Status Stable, Respiratory Function Stable, Patent Airway and No signs of Nausea or vomiting              Post-op Vital Signs: Reviewed and stable  Last Vitals:  Filed Vitals:   02/09/15 2131  BP:   Pulse: 88  Temp:   Resp: 32    Complications: No apparent anesthesia complications

## 2015-02-09 NOTE — Progress Notes (Signed)
Patient admitted to room 169

## 2015-02-09 NOTE — Transfer of Care (Signed)
Immediate Anesthesia Transfer of Care Note  Patient: Kiara Hall  Procedure(s) Performed: Procedure(s): CESAREAN SECTION (N/A)  Patient Location: PACU  Anesthesia Type:Epidural  Level of Consciousness: awake  Airway & Oxygen Therapy: Patient Spontanous Breathing  Post-op Assessment: Report given to RN and Post -op Vital signs reviewed and stable  Post vital signs: stable  Last Vitals:  Filed Vitals:   02/09/15 1916  BP:   Pulse: 150  Temp:   Resp:     Complications: No apparent anesthesia complications

## 2015-02-10 ENCOUNTER — Encounter (HOSPITAL_COMMUNITY): Payer: Self-pay

## 2015-02-10 LAB — CBC
HCT: 28.7 % — ABNORMAL LOW (ref 36.0–46.0)
Hemoglobin: 9.7 g/dL — ABNORMAL LOW (ref 12.0–15.0)
MCH: 27.2 pg (ref 26.0–34.0)
MCHC: 33.8 g/dL (ref 30.0–36.0)
MCV: 80.4 fL (ref 78.0–100.0)
Platelets: 125 10*3/uL — ABNORMAL LOW (ref 150–400)
RBC: 3.57 MIL/uL — ABNORMAL LOW (ref 3.87–5.11)
RDW: 15.9 % — ABNORMAL HIGH (ref 11.5–15.5)
WBC: 8.4 10*3/uL (ref 4.0–10.5)

## 2015-02-10 MED ORDER — INFLUENZA VAC SPLIT QUAD 0.5 ML IM SUSY
0.5000 mL | PREFILLED_SYRINGE | INTRAMUSCULAR | Status: DC
Start: 2015-02-11 — End: 2015-02-12

## 2015-02-10 NOTE — Lactation Note (Signed)
This note was copied from the chart of [place]. Lactation Consultation Note  Patient Name: Kiara Hall HEBBW'N Date: 02/10/2015 Reason for consult: Initial assessment;NICU baby  NICU baby 25 hours old. Met with mom in NICU to assist with latching baby at breast. Mom using a "boppy" pillow and latching baby in cradle position. Assisted mom to reposition baby in cross-cradle position. Demonstrated hand expression with colostrum present. Mom reports increased comfort. Discussed benefits of STS and nuzzling/latching for baby and mom's milk supply. Met mom in her room on MBU and demonstrated cleaning supplies. Enc mom to pump every 2-3 hours for 15 minutes. Enc mom to sleep tonight, and to attempt to rest today as well. Mom given NICU booklet and [place] brochure. Mom aware of assistance with latching as needed.  Maternal Data    Feeding Feeding Type: Breast Fed Length of feed: 20 min  LATCH Score/Interventions Latch: Repeated attempts needed to sustain latch, nipple held in mouth throughout feeding, stimulation needed to elicit sucking reflex. Intervention(s): Breast massage  Audible Swallowing: None Intervention(s): Skin to skin  Type of Nipple: Everted at rest and after stimulation  Comfort (Breast/Nipple): Soft / non-tender     Hold (Positioning): Assistance needed to correctly position infant at breast and maintain latch.  LATCH Score: 6  Lactation Tools Discussed/Used Pump Review: Setup, frequency, and cleaning;Milk Storage Initiated by:: Bedside RN. Date initiated:: 02/10/15   Consult Status Consult Status: Follow-up Date: 02/11/15 Follow-up type: In-patient    Inocente Salles 02/10/2015, 2:03 PM

## 2015-02-10 NOTE — Progress Notes (Signed)
Subjective: Postpartum Day 1: Cesarean Delivery Patient reports tolerating PO and no problems voiding.   Pt states her pain is well controlled.   Objective: Vital signs in last 24 hours: Temp:  [97.8 F (36.6 C)-99.5 F (37.5 C)] 98.7 F (37.1 C) (10/11 1315) Pulse Rate:  [81-150] 86 (10/11 1315) Resp:  [9-32] 18 (10/11 1315) BP: (102-138)/(49-75) 114/58 mmHg (10/11 1315) SpO2:  [95 %-100 %] 98 % (10/11 1315)  Physical Exam:  General: alert and cooperative Lochia: appropriate Uterine Fundus: firm Incision: healing well DVT Evaluation: No evidence of DVT seen on physical exam.   Recent Labs  02/09/15 0105 02/10/15 0547  HGB 11.9* 9.7*  HCT 35.7* 28.7*    Assessment/Plan: Status post Cesarean section. Doing well postoperatively.  Continue current care  Anemia -Feso4 D/w pt r/b/a of newborn circumcision. She is made aware that it is not medically necessary. It is for cosmetic purposes only. Risk were reviewed including but not limited to infection. Bleeding damage to the penis and poor cosmesis with the need for further surgery. Pt voiced understanding and desires to have circumcision performed inpatient. Jessee Avers. 02/10/2015, 5:55 PM

## 2015-02-10 NOTE — Anesthesia Postprocedure Evaluation (Signed)
  Anesthesia Post-op Note  Patient: Kiara Hall  Procedure(s) Performed: Procedure(s): CESAREAN SECTION (N/A)  Patient Location: Mother/Baby  Anesthesia Type:Epidural  Level of Consciousness: awake, alert  and oriented  Airway and Oxygen Therapy: Patient Spontanous Breathing  Post-op Pain: none  Post-op Assessment: Post-op Vital signs reviewed and Patient's Cardiovascular Status Stable              Post-op Vital Signs: Reviewed and stable  Last Vitals:  Filed Vitals:   02/10/15 0541  BP:   Pulse:   Temp: 37.3 C  Resp: 18    Complications: No apparent anesthesia complications

## 2015-02-11 LAB — CBC
HCT: 28.5 % — ABNORMAL LOW (ref 36.0–46.0)
Hemoglobin: 9.3 g/dL — ABNORMAL LOW (ref 12.0–15.0)
MCH: 26.7 pg (ref 26.0–34.0)
MCHC: 32.6 g/dL (ref 30.0–36.0)
MCV: 81.9 fL (ref 78.0–100.0)
Platelets: 125 10*3/uL — ABNORMAL LOW (ref 150–400)
RBC: 3.48 MIL/uL — ABNORMAL LOW (ref 3.87–5.11)
RDW: 16.3 % — ABNORMAL HIGH (ref 11.5–15.5)
WBC: 8.2 10*3/uL (ref 4.0–10.5)

## 2015-02-11 NOTE — Lactation Note (Signed)
This note was copied from the chart of Boy Felipa Brimmer. Lactation Consultation Note  Patient Name: Boy Morton StallMegan Brimmer [id number]'XToday's Date: 02/11/2015 Reason for consult: Follow-up assessment;NICU baby NICU baby 9438 hours old. Mom states that she is attempting to put baby to breast with each feeding and it seems to be going well. Mom attempting to sleep now, enc [facility].  Maternal Data    Feeding Feeding Type: Breast Fed Length of feed: 30 min  LATCH Score/Interventions Latch: Grasps breast easily, tongue down, lips flanged, rhythmical sucking.  Audible Swallowing: Spontaneous and intermittent  Type of Nipple: Everted at rest and after stimulation  Comfort (Breast/Nipple): Soft / non-tender     Hold (Positioning): No assistance needed to correctly position infant at breast.  LATCH Score: 10  Lactation Tools Discussed/Used     Consult Status Consult Status: Follow-up Date: 02/12/15 Follow-up type: In-patient    Geralynn OchsWILLIARD, Rennae Ferraiolo 02/11/2015, 10:26 AM

## 2015-02-11 NOTE — Lactation Note (Deleted)
This note was copied from the chart of Boy Cally Brimmer. Lactation Consultation Note  Patient Name: Boy Morton StallMegan Brimmer ZOXWR'UToday's Date: 02/11/2015 Reason for consult: Initial assessment  Baby 42 hours old. Mom states that baby just spit up formula. Baby still spitty. Demonstrated to parents how to assist baby by turning baby over and patting his back. Demonstrated to mom how to position herself to latch baby in football position. Baby sleepy and not cueing to nurse. Enc mom to keep baby STS and latch with cues.  Maternal Data Has patient been taught Hand Expression?: Yes Does the patient have breastfeeding experience prior to this delivery?: No  Feeding Feeding Type: Breast Fed Length of feed: 30 min  LATCH Score/Interventions Latch: Too sleepy or reluctant, no latch achieved, no sucking elicited. Intervention(s): Skin to skin;Teach feeding cues  Audible Swallowing: None  Type of Nipple: Everted at rest and after stimulation  Comfort (Breast/Nipple): Soft / non-tender     Hold (Positioning): Assistance needed to correctly position infant at breast and maintain latch. Intervention(s): Breastfeeding basics reviewed;Support Pillows;Position options;Skin to skin  LATCH Score: 5  Lactation Tools Discussed/Used     Consult Status Consult Status: Follow-up Date: 02/12/15 Follow-up type: In-patient    Geralynn OchsWILLIARD, Aleynah Rocchio 02/11/2015, 2:23 PM

## 2015-02-11 NOTE — Progress Notes (Signed)
CSW acknowledges NICU admission.    Patient screened out for psychosocial assessment since none of the following apply:  Psychosocial stressors documented in mother or baby's chart  Gestation less than 32 weeks  Code at delivery   Critically ill infant  Infant with anomalies  Please contact the Clinical Social Worker if specific needs arise, or by MOB's request.       

## 2015-02-11 NOTE — Progress Notes (Signed)
Subjective: Postpartum Day 2: Cesarean Delivery Patient reports incisional pain, tolerating PO, + BM and no problems voiding.    Objective: Vital signs in last 24 hours: Temp:  [98.5 F (36.9 C)-99.1 F (37.3 C)] 99.1 F (37.3 C) (10/12 0550) Pulse Rate:  [86-110] 110 (10/12 0550) Resp:  [18-20] 20 (10/12 0550) BP: (111-125)/(52-76) 125/76 mmHg (10/12 0550) SpO2:  [100 %] 100 % (10/11 1808)  Physical Exam:  General: alert and cooperative Lochia: appropriate Uterine Fundus: firm Incision: healing well DVT Evaluation: No evidence of DVT seen on physical exam. Negative Homan's sign.   Recent Labs  02/10/15 0547 02/11/15 0543  HGB 9.7* 9.3*  HCT 28.7* 28.5*    Assessment/Plan: Status post Cesarean section. Doing well postoperatively.  Continue current care Plan for discharge home tomorrow  Follow up in 2 wks for incision check and to rule out postpartum depression .  Kiara Hall J. 02/11/2015, 4:39 PM

## 2015-02-12 MED ORDER — OXYCODONE-ACETAMINOPHEN 5-325 MG PO TABS: 1.0000 | ORAL_TABLET | ORAL | Status: DC | PRN

## 2015-02-12 MED ORDER — IBUPROFEN 600 MG PO TABS: 600.0000 mg | ORAL_TABLET | Freq: Four times a day (QID) | ORAL | Status: DC

## 2015-02-12 NOTE — Discharge Summary (Signed)
OB Discharge Summary     Patient Name: Kiara Hall DOB: 05/26/1980 MRN: [id number]  Date of admission: 02/09/2015 Delivering MD: Gerald LeitzOLE, TARA   Date of discharge: 02/12/2015  Admitting diagnosis: INDUCTION Intrauterine pregnancy: 4935w2d     Secondary diagnosis: Suspected macrosomia.     Discharge diagnosis: Term Pregnancy Delivered                                                                                                Post partum procedures:None  Augmentation: Pitocin  Complications: None  Hospital course:  Onset of Labor With Unplanned C/S  34 y.o. yo G1P1001 at 3335w2d was admitted in for Induction of labor on 02/09/2015. Patient had a labor course significant for late decelerations.. Membrane Rupture Time/Date: 3:21 PM ,02/09/2015   The patient went for cesarean section due to Non-Reassuring FHR and Suspected CPD, and delivered a Viable infant,02/09/2015  Details of operation can be found in separate operative note. Patient had an uncomplicated postpartum course.  She is ambulating,tolerating a regular diet, passing flatus, and urinating well.  Patient is discharged home in stable condition 02/12/2015  3:00 PM.   Physical exam  Filed Vitals:   02/10/15 1315 02/10/15 1808 02/11/15 0550 02/11/15 1754  BP: 114/58 111/52 125/76 124/74  Pulse: 86 86 110 97  Temp: 98.7 F (37.1 C) 98.5 F (36.9 C) 99.1 F (37.3 C) 98.7 F (37.1 C)  TempSrc: Oral Oral Oral Oral  Resp: 18 18 20 20   Height:      Weight:      SpO2: 98% 100%     General: alert, cooperative and no distress Lochia: appropriate Uterine Fundus: firm Incision: Dressing is clean, dry, and intact DVT Evaluation: No evidence of DVT seen on physical exam. Calf/Ankle edema is present Labs: Lab Results  Component Value Date   WBC 8.2 02/11/2015   HGB 9.3* 02/11/2015   HCT 28.5* 02/11/2015   MCV 81.9 02/11/2015   PLT 125* 02/11/2015   CMP Latest Ref Rng 02/03/2014  Glucose 70 - 99 mg/dL 91  BUN 6 - 23  mg/dL 8  Creatinine 4.090.50 - 8.111.10 mg/dL 9.140.70  Sodium 782137 - 956147 mEq/L 139  Potassium 3.7 - 5.3 mEq/L 3.9  Chloride 96 - 112 mEq/L 101  CO2 19 - 32 mEq/L 24  Calcium 8.4 - 10.5 mg/dL 9.6  Total Protein - -  Total Bilirubin - -  Alkaline Phos - -  AST - -  ALT - -    Discharge instruction: per After Visit Summary and "Baby and Me Booklet".  Medications:  Current facility-administered medications:  .  acetaminophen (TYLENOL) tablet 650 mg, 650 mg, Oral, Q4H PRN, Gerald Leitzara Cole, MD, 650 mg at 02/12/15 [id number] .  witch hazel-glycerin (TUCKS) pad 1 application, 1 application, Topical, PRN **AND** dibucaine (NUPERCAINAL) 1 % rectal ointment 1 application, 1 application, Rectal, PRN, Gerald Leitzara Cole, MD .  diphenhydrAMINE (BENADRYL) injection 12.5 mg, 12.5 mg, Intravenous, Q4H PRN **OR** diphenhydrAMINE (BENADRYL) capsule 25 mg, 25 mg, Oral, Q4H PRN, Sebastian Acheheodore Manny, MD .  diphenhydrAMINE (BENADRYL) capsule 25 mg, 25 mg, Oral, Q6H PRN,  Gerald Leitz, MD .  ferrous sulfate tablet 325 mg, 325 mg, Oral, BID WC, Gerald Leitz, MD, 325 mg at 02/11/15 1125 .  ibuprofen (ADVIL,MOTRIN) tablet 600 mg, 600 mg, Oral, 4 times per day, Gerald Leitz, MD, 600 mg at 02/12/15 0631 .  Influenza vac split quadrivalent PF (FLUARIX) injection 0.5 mL, 0.5 mL, Intramuscular, Tomorrow-1000, Gerald Leitz, MD .  lactated ringers infusion, , Intravenous, Continuous, Gerald Leitz, MD, Last Rate: 125 mL/hr at 02/10/15 0300 .  lanolin ointment 1 application, 1 application, Topical, PRN, Gerald Leitz, MD .  menthol-cetylpyridinium (CEPACOL) lozenge 3 mg, 1 lozenge, Oral, Q2H PRN, Gerald Leitz, MD .  methylergonovine (METHERGINE) tablet 0.2 mg, 0.2 mg, Oral, Q4H PRN **OR** methylergonovine (METHERGINE) injection 0.2 mg, 0.2 mg, Intramuscular, Q4H PRN, Gerald Leitz, MD .  nalbuphine (NUBAIN) injection 5 mg, 5 mg, Intravenous, Q4H PRN **OR** nalbuphine (NUBAIN) injection 5 mg, 5 mg, Subcutaneous, Q4H PRN, Sebastian Ache, MD .  nalbuphine (NUBAIN) injection 5 mg, 5 mg,  Intravenous, Once PRN **OR** nalbuphine (NUBAIN) injection 5 mg, 5 mg, Subcutaneous, Once PRN, Sebastian Ache, MD .  naloxone [facility]) 2 mg in dextrose 5 % 250 mL infusion, 1-4 mcg/kg/hr, Intravenous, Continuous PRN, Sebastian Ache, MD .  naloxone [facility]) injection 0.4 mg, 0.4 mg, Intravenous, PRN **AND** sodium chloride 0.9 % injection 3 mL, 3 mL, Intravenous, PRN, Sebastian Ache, MD .  ondansetron [facility]) injection 4 mg, 4 mg, Intravenous, Q8H PRN, Sebastian Ache, MD .  ondansetron (ZOFRAN) injection 4 mg, 4 mg, Intravenous, Q8H PRN, Sebastian Ache, MD .  oxyCODONE-acetaminophen (PERCOCET/ROXICET) 5-325 MG per tablet 1 tablet, 1 tablet, Oral, Q4H PRN, Gerald Leitz, MD .  oxyCODONE-acetaminophen (PERCOCET/ROXICET) 5-325 MG per tablet 2 tablet, 2 tablet, Oral, Q4H PRN, Gerald Leitz, MD .  prenatal multivitamin tablet 1 tablet, 1 tablet, Oral, Q1200, Gerald Leitz, MD, 1 tablet at 02/10/15 1404 .  scopolamine (TRANSDERM-SCOP) 1 MG/3DAYS 1.5 mg, 1 patch, Transdermal, Once, Sebastian Ache, MD .  senna-docusate (Senokot-S) tablet 2 tablet, 2 tablet, Oral, Q24H, Gerald Leitz, MD, 2 tablet at 02/12/15 0015 .  simethicone (MYLICON) chewable tablet 80 mg, 80 mg, Oral, TID PC, Gerald Leitz, MD, 80 mg at 02/11/15 1357 .  simethicone (MYLICON) chewable tablet 80 mg, 80 mg, Oral, PRN, Gerald Leitz, MD, 80 mg at 02/11/15 1836 .  zolpidem (AMBIEN) tablet 5 mg, 5 mg, Oral, QHS PRN, Gerald Leitz, MD  Diet: routine diet  Activity: Advance as tolerated. Pelvic rest for 6 weeks.   Outpatient follow up:2 weeks  Postpartum contraception: Not Discussed  Newborn Data: Live born female  Birth Weight: 8 lb 3 oz (3714 g) APGAR: 8, 9  Baby Feeding: Breast Disposition:home with mother   02/12/2015 Geryl Rankins, MD

## 2015-02-12 NOTE — Lactation Note (Addendum)
This note was copied from the chart of [place]. Lactation Consultation Note  Patient Name: Boy Kathern Lobosco [id number] Date: 02/12/2015 Reason for consult: Follow-up assessment;NICU baby NICU baby 63 hours old. Mom states that she has been going to NICU and putting baby to breast at each feeding and baby has not been supplemented. Mom is expecting to be D/C'd today, and reports that she has a personal Ameda pump at home. Mom states that she knows her pump not rated as high as a Medela. Discussed personal vs hospital-grade pump, as well as having baby at breast. Mom thinks baby will be D/C'd in a day or so. Mom aware of 2-week DEBP rental as needed. Mom enc to use pumping rooms in NICU while visiting. Mom states her nipples are sore. Mom given comfort gels with instructions and enc to use EBM. Mom given handouts for galactagogues, but enc to focus primarily on stimulating breast--nursing baby/pumping.  Met with mom in NICU, and mom nursing baby in cradle position on left breast. Demonstrated to mom how to hold baby in cross-cradle position and support baby's head. Enc mom to keep baby tucked in close as a deep latch will reduce nipple soreness and increase milk transfer. Mom concerned that baby may need to be supplemented with formula due to jaundice. Enc post-pumping in room to give EBM to baby after nursing. Reviewed engorgement prevention/treatment, and mom aware of OP/BFSG and [place] phone line assistance after D/C.   Maternal Data    Feeding    LATCH Score/Interventions                      Lactation Tools Discussed/Used     Consult Status Consult Status: PRN    Inocente Salles 02/12/2015, 10:10 AM

## 2015-02-12 NOTE — Discharge Instructions (Signed)
Cesarean Delivery, Care After  Refer to this sheet in the next few weeks. These instructions provide you with information on caring for yourself after your procedure. Your health care provider may also give you specific instructions. Your treatment has been planned according to current medical practices, but problems sometimes occur. Call your health care provider if you have any problems or questions after you go home.  HOME CARE INSTRUCTIONS   Only take over-the-counter or prescription medications as directed by your health care provider.   Do not drink alcohol, especially if you are breastfeeding or taking medication to relieve pain.   Do not chew or smoke tobacco.   Continue to use good perineal care. Good perineal care includes:    Wiping your perineum from front to back.    Keeping your perineum clean.   Check your surgical cut (incision) daily for increased redness, drainage, swelling, or separation of skin.   Clean your incision gently with soap and water every day, and then pat it dry. If your health care provider says it is okay, leave the incision uncovered. Use a bandage (dressing) if the incision is draining fluid or appears irritated. If the adhesive strips across the incision do not fall off within 7 days, carefully peel them off.   Hug a pillow when coughing or sneezing until your incision is healed. This helps to relieve pain.   Do not use tampons or douche until your health care provider says it is okay.   Shower, wash your hair, and take tub baths as directed by your health care provider.   Wear a well-fitting bra that provides breast support.   Limit wearing support panties or control-top hose.   Drink enough fluids to keep your urine clear or pale yellow.   Eat high-fiber foods such as whole grain cereals and breads, brown rice, beans, and fresh fruits and vegetables every day. These foods may help prevent or relieve constipation.   Resume activities such as climbing stairs,  driving, lifting, exercising, or traveling as directed by your health care provider.   Talk to your health care provider about resuming sexual activities. This is dependent upon your risk of infection, your rate of healing, and your comfort and desire to resume sexual activity.   Try to have someone help you with your household activities and your newborn for at least a few days after you leave the hospital.   Rest as much as possible. Try to rest or take a nap when your newborn is sleeping.   Increase your activities gradually.   Keep all of your scheduled postpartum appointments. It is very important to keep your scheduled follow-up appointments. At these appointments, your health care provider will be checking to make sure that you are healing physically and emotionally.  SEEK MEDICAL CARE IF:    You are passing large clots from your vagina. Save any clots to show your health care provider.   You have a foul smelling discharge from your vagina.   You have trouble urinating.   You are urinating frequently.   You have pain when you urinate.   You have a change in your bowel movements.   You have increasing redness, pain, or swelling near your incision.   You have pus draining from your incision.   Your incision is separating.   You have painful, hard, or reddened breasts.   You have a severe headache.   You have blurred vision or see spots.   You feel sad   or depressed.   You have thoughts of hurting yourself or your newborn.   You have questions about your care, the care of your newborn, or medications.   You are dizzy or light-headed.   You have a rash.   You have pain, redness, or swelling at the site of the removed intravenous access (IV) tube.   You have nausea or vomiting.   You stopped breastfeeding and have not had a menstrual period within 12 weeks of stopping.   You are not breastfeeding and have not had a menstrual period within 12 weeks of delivery.   You have a fever.  SEEK  IMMEDIATE MEDICAL CARE IF:   You have persistent pain.   You have chest pain.   You have shortness of breath.   You faint.   You have leg pain.   You have stomach pain.   Your vaginal bleeding saturates 2 or more sanitary pads in 1 hour.  MAKE SURE YOU:    Understand these instructions.   Will watch your condition.   Will get help right away if you are not doing well or get worse.     This information is not intended to replace advice given to you by your health care provider. Make sure you discuss any questions you have with your health care provider.     Document Released: 01/08/2002 Document Revised: 05/09/2014 Document Reviewed: 12/14/2011  Elsevier Interactive Patient Education 2016 Elsevier Inc.

## 2015-03-08 ENCOUNTER — Inpatient Hospital Stay (HOSPITAL_COMMUNITY)
Admission: AD | Admit: 2015-03-08 | Discharge: 2015-03-08 | Disposition: A | Discharge status: Home / Self Care | Payer: Federal, State, Local not specified - PPO | Source: Ambulatory Visit | Attending: Obstetrics and Gynecology | Admitting: Obstetrics and Gynecology

## 2015-03-08 ENCOUNTER — Encounter (HOSPITAL_COMMUNITY): Payer: Self-pay | Admitting: *Deleted

## 2015-03-08 DIAGNOSIS — T8189XA Other complications of procedures, not elsewhere classified, initial encounter: Secondary | ICD-10-CM | POA: Insufficient documentation

## 2015-03-08 NOTE — MAU Provider Note (Signed)
Chief Complaint: C/S Incision Bleeding   First Provider Initiated Contact with Patient 03/08/15 1202     SUBJECTIVE HPI: Kiara Hall is a 34 y.o. G1P1001 at 4 weeks S/P LTCS on 02/09/15 who presents to Maternity Admissions reporting small amount of bleeding from left end of incision that started immediately before coming to maternity admissions. Has been seen in the office for a different area of separation of the incision that has since healed well.  Associated signs and symptoms: Negative for fever, chills or incision tenderness, erythema or purulent drainage.  Past Medical History  Diagnosis Date  . Palpitation   . Anemia   . Anxiety    OB History  Gravida Para Term Preterm AB SAB TAB Ectopic Multiple Living  0 1    # Outcome Date GA Lbr Len/2nd Weight Sex Delivery Anes PTL Lv  1 Term 02/09/15 [redacted]w[redacted]d  3.714 kg (8 lb 3 oz) M CS-LTranv EPI  Y     Past Surgical History  Procedure Laterality Date  . Tonsillectomy    . Cesarean section N/A 02/09/2015    Procedure: CESAREAN SECTION;  Surgeon: Gerald Leitz, MD;  Location: [facility];  Service: Obstetrics;  Laterality: N/A;   Social History   Social History  . Marital Status: Single    Spouse Name: N/A  . Number of Children: N/A  . Years of Education: N/A   Occupational History  . Not on file.   Social History Main Topics  . Smoking status: Never Smoker   . Smokeless tobacco: Not on file  . Alcohol Use: No  . Drug Use: No  . Sexual Activity: Yes    Birth Control/ Protection: None   Other Topics Concern  . Not on file   Social History Narrative   No current facility-administered medications on file prior to encounter.   Current Outpatient Prescriptions on File Prior to Encounter  Medication Sig Dispense Refill  . Prenatal Vit-Min-FA-Fish Oil ([employer] PRENATAL GUMMY PO) Take 2 each by mouth daily.    Marland Kitchen ibuprofen (ADVIL,MOTRIN) 600 MG tablet Take 1 tablet (600 mg total) by mouth every 6 (six) hours. (Patient not  taking: Reported on 03/08/2015) 30 tablet 0  . NIFEdipine (PROCARDIA-XL/ADALAT-CC/NIFEDICAL-XL) 30 MG 24 hr tablet Take 1 tablet (30 mg total) by mouth daily. (Patient not taking: Reported on 02/02/2015) 15 tablet 0  . oxyCODONE-acetaminophen (PERCOCET/ROXICET) 5-325 MG tablet Take 1 tablet by mouth every 4 (four) hours as needed (for pain scale 4-7). (Patient not taking: Reported on 03/08/2015) 30 tablet 0   Allergies  Allergen Reactions  . Metronidazole Hives and Shortness Of Breath    I have reviewed the past Medical Hx, Surgical Hx, Social Hx, Allergies and Medications.   Review of Systems  Constitutional: Negative for fever and chills.  Gastrointestinal: Negative for abdominal pain.  Skin:       Positive for scant bleeding from incision. Negative for purulent drainage, erythema or tenderness.    OBJECTIVE Patient Vitals for the past 24 hrs:  BP Temp Temp src Pulse Resp Height Weight  03/08/15 1122 125/68 mmHg 98.3 F (36.8 C) Oral 81 18  (1.626 m) 92.307 kg (203 lb 8 oz)   Constitutional: Well-developed, well-nourished female in no acute distress.  Cardiovascular: normal rate Respiratory: normal rate and effort.  GI: Abd soft, non-tender. Skin: Incision healing well except for 2 cm area of separation approximately 1-2 mm deep. No active bleeding. No drainage, erythema, warmth  or swelling. Scant bright red blood on pad placed over the incision. Neurologic: Alert and oriented x 4.   LAB RESULTS No results found for this or any previous visit (from the past 24 hour(s)).  IMAGING No results found.  MAU COURSE Discussed incision bleeding and history of other area of incision separation with Dr. Normand Sloopillard. No new orders. Recommends patient follow up in office as scheduled or sooner as needed if symptoms worsen.  MDM 34 year old female 4 weeks status post low transverse C-section with tiny separation scant bleeding of incision. No concern for infection or significant wound  dehiscence.  ASSESSMENT 1. Delayed surgical wound healing, initial encounter    PLAN Discharge home in stable condition per consult with Dr. Normand Sloopillard. Postop Precautions Incision care discussed. Patient instructed to return to maternity admissions for fever greater than 100.4, purulent drainage, increased bleeding or pain at incision site.     Follow-up Information    Follow up with Geryl RankinsVARNADO, EVELYN, MD In 3 weeks.   Specialty:  Obstetrics and Gynecology   Why:  For wound re-check or sooner as needed if symptoms worsen   Contact information:   [address] [place] [address] [phone]       [facility].   Why:  As needed if symptoms worsen   Contact information:   [address] [id number] mc [place] [id number] [phone]       Medication List    STOP taking these medications        ibuprofen 600 MG tablet  Commonly known as:  ADVIL,MOTRIN     NIFEdipine 30 MG 24 hr tablet  Commonly known as:  PROCARDIA-XL/ADALAT-CC/NIFEDICAL-XL     oxyCODONE-acetaminophen 5-325 MG tablet  Commonly known as:  PERCOCET/ROXICET      TAKE these medications        acetaminophen 500 MG tablet  Commonly known as:  TYLENOL  Take 500 mg by mouth every 6 (six) hours as needed for mild pain or moderate pain.     [facility] PRENATAL GUMMY PO  Take 2 each by mouth daily.       BunnellVirginia Mahin Guardia, CNM 03/08/2015  12:42 PM

## 2015-03-08 NOTE — MAU Note (Signed)
Patient presents 4 weeks post cesarean section with c/o bleeding at the surgical site since this morning. States that she has had difficulty with the left side of the incision not approximated and is being watched in the office for this. Denies abnormal bleeding or discharge.

## 2015-03-08 NOTE — Discharge Instructions (Signed)
Incision Care °An incision is when a surgeon cuts into your body. After surgery, the incision needs to be cared for properly to prevent infection.  °HOW TO CARE FOR YOUR INCISION °· Take medicines only as directed by your health care provider. °· There are many different ways to close and cover an incision, including stitches, skin glue, and adhesive strips. Follow your health care provider's instructions on: °¨ Incision care. °¨ Bandage (dressing) changes and removal. °¨ Incision closure removal. °· Do not take baths, swim, or use a hot tub until your health care provider approves. You may shower as directed by your health care provider. °· Resume your normal diet and activities as directed. °· Use anti-itch medicine (such as an antihistamine) as directed by your health care provider. The incision may itch while it is healing. Do not pick or scratch at the incision. °· Drink enough fluid to keep your urine clear or pale yellow. °SEEK MEDICAL CARE IF:  °· You have drainage, redness, swelling, or pain at your incision site. °· You have muscle aches, chills, or a general ill feeling. °· You notice a bad smell coming from the incision or dressing. °· Your incision edges separate after the sutures, staples, or skin adhesive strips have been removed. °· You have persistent nausea or vomiting. °· You have a fever. °· You are dizzy. °SEEK IMMEDIATE MEDICAL CARE IF:  °· You have a rash. °· You faint. °· You have difficulty breathing. °MAKE SURE YOU:  °· Understand these instructions. °· Will watch your condition. °· Will get help right away if you are not doing well or get worse. °  °This information is not intended to replace advice given to you by your health care provider. Make sure you discuss any questions you have with your health care provider. °  °Document Released: 11/05/2004 Document Revised: 05/09/2014 Document Reviewed: 06/12/2013 °Elsevier Interactive Patient Education ©2016 Elsevier Inc. ° °

## 2015-04-21 IMAGING — CR DG CHEST 2V
2 series · 2 of 2 positions shown · non-contrast
Comparison: CT chest 10/24/2007.  PA and lateral chest 05/03/2012.

CLINICAL DATA: Chest pain.

EXAM:
CHEST  2 VIEW

[w chest pa]
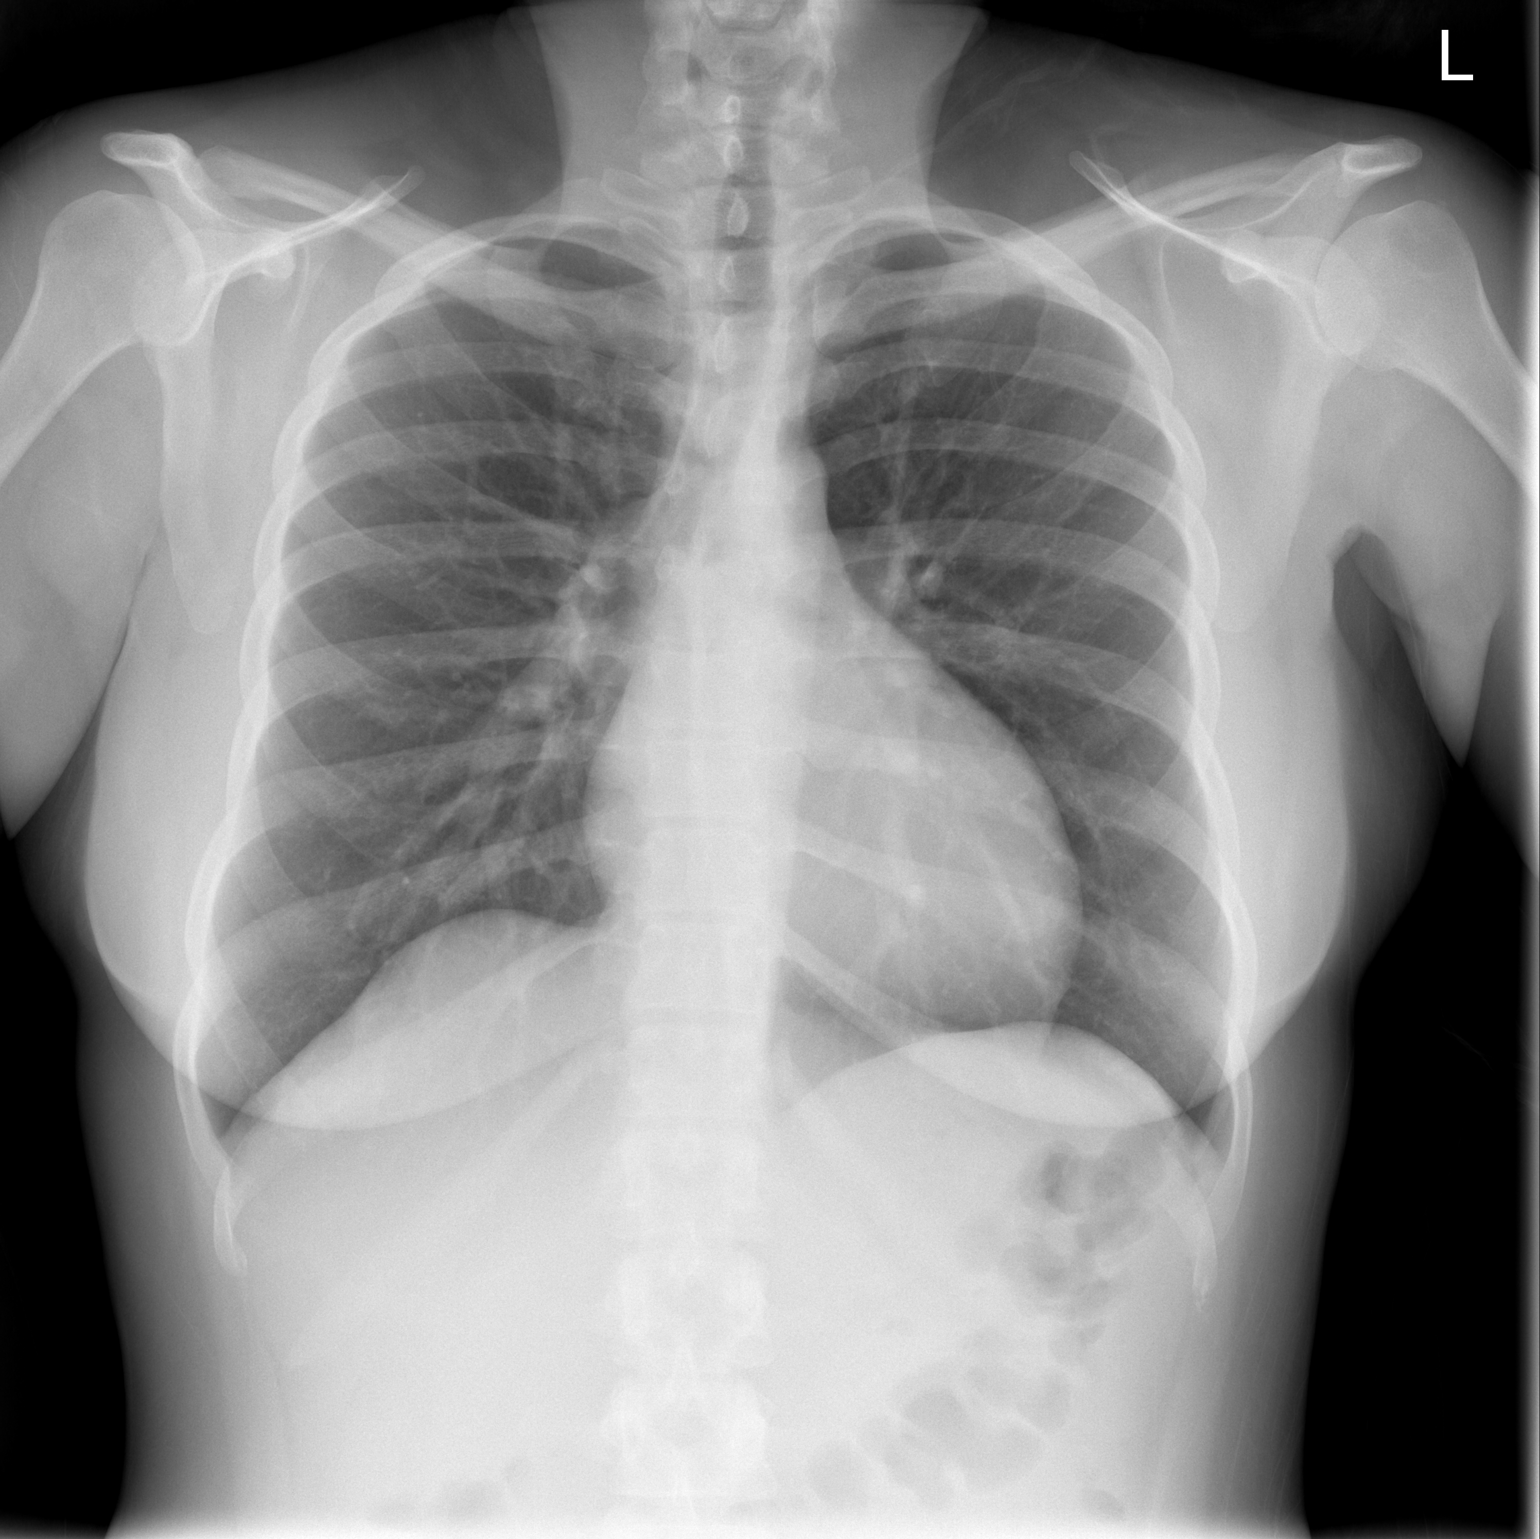

[w chest lat]
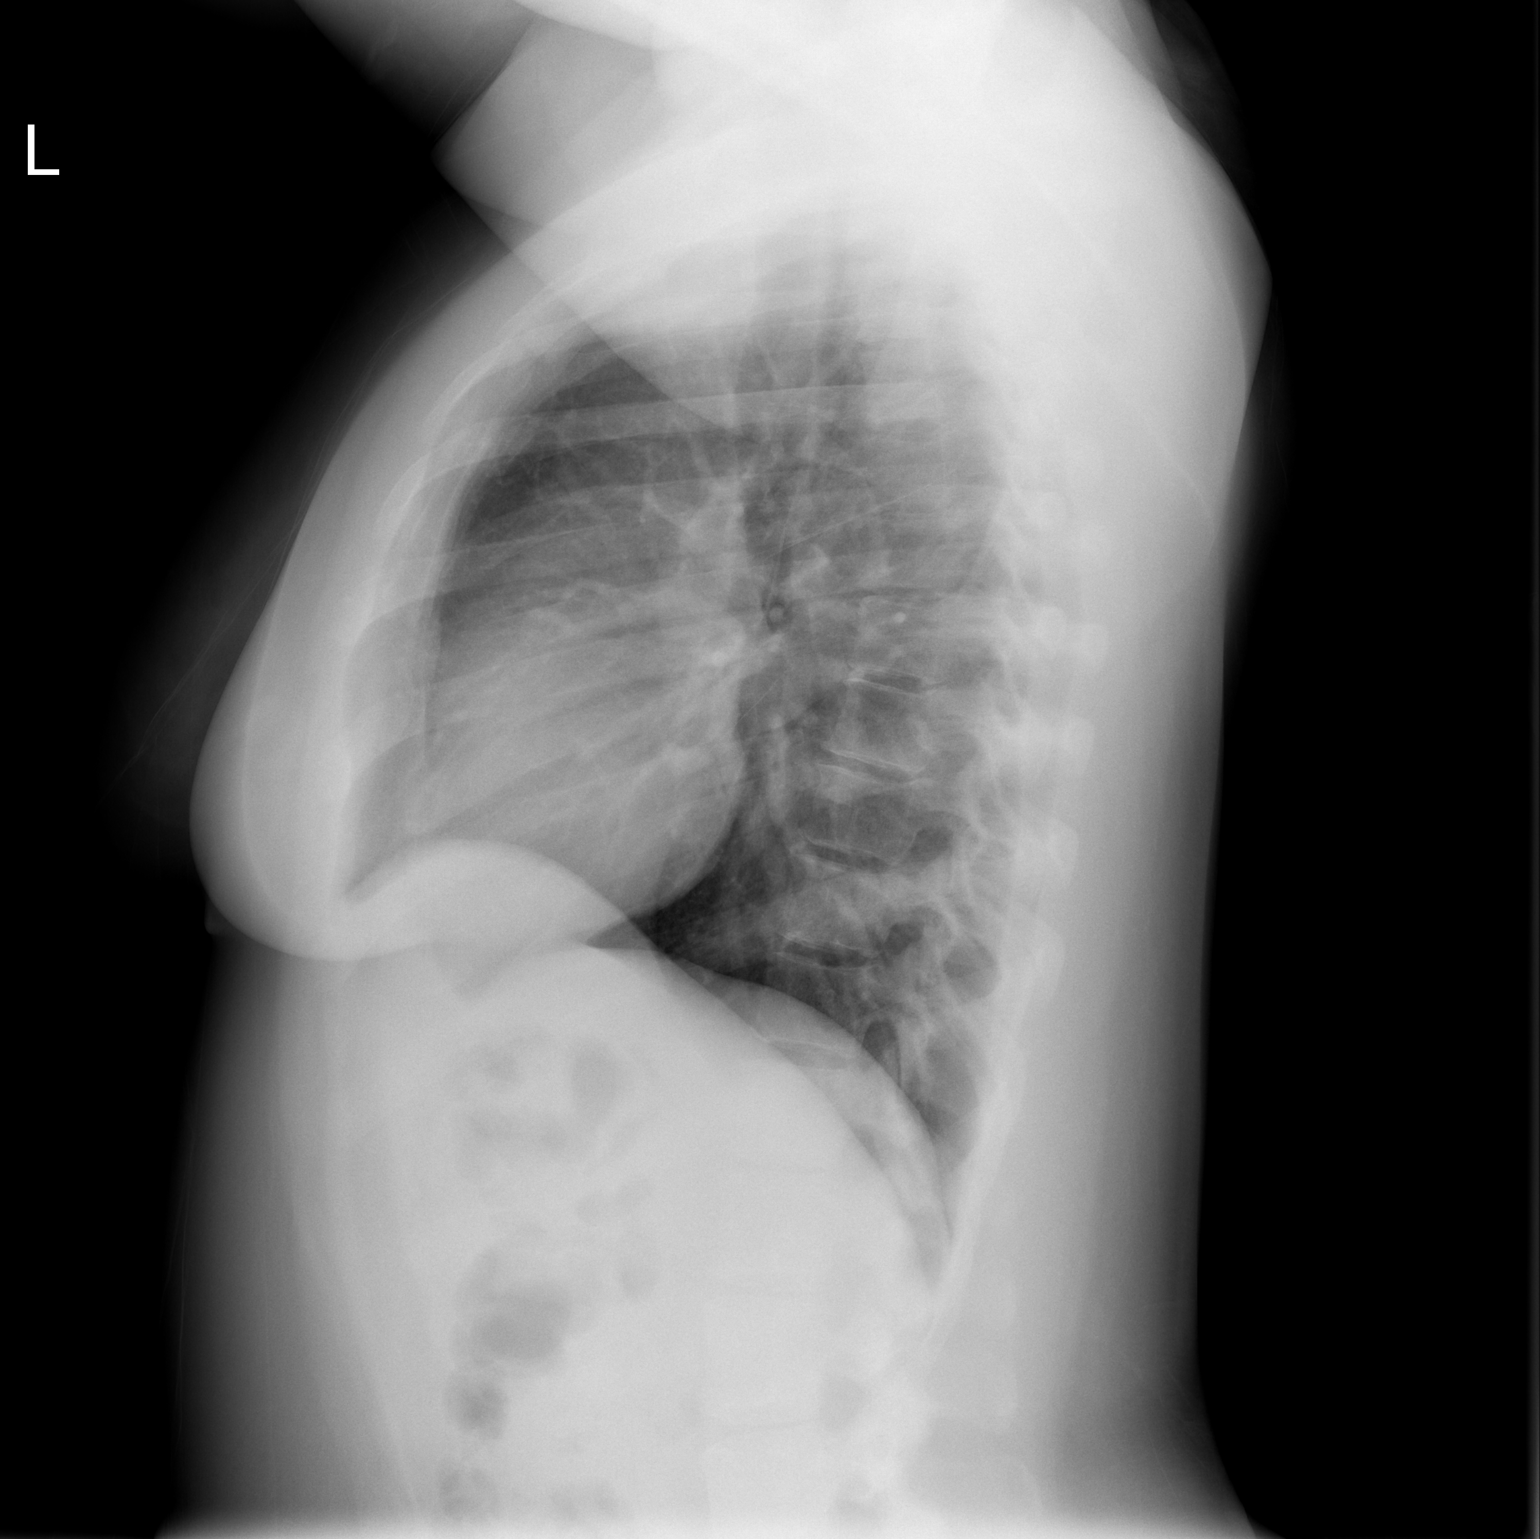

[2 of 2 positions shown; findings below may reference images not displayed]

FINDINGS: Heart size and mediastinal contours are within normal limits. Both
lungs are clear. Visualized skeletal structures are unremarkable.
IMPRESSION: Negative exam.

## 2015-06-10 ENCOUNTER — Other Ambulatory Visit: Payer: Self-pay | Admitting: Obstetrics and Gynecology

## 2015-06-10 ENCOUNTER — Other Ambulatory Visit (HOSPITAL_COMMUNITY)
Admission: RE | Admit: 2015-06-10 | Discharge: 2015-06-10 | Disposition: A | Discharge status: Home / Self Care | Payer: Federal, State, Local not specified - PPO | Source: Ambulatory Visit | Attending: Obstetrics and Gynecology | Admitting: Obstetrics and Gynecology

## 2015-06-10 DIAGNOSIS — Z01419 Encounter for gynecological examination (general) (routine) without abnormal findings: Secondary | ICD-10-CM | POA: Insufficient documentation

## 2015-06-15 LAB — CYTOLOGY - PAP

## 2015-11-05 DIAGNOSIS — T888 Other specified complications of surgical and medical care, not elsewhere classified: Secondary | ICD-10-CM | POA: Diagnosis not present

## 2015-11-05 DIAGNOSIS — Z6836 Body mass index (BMI) 36.0-36.9, adult: Secondary | ICD-10-CM | POA: Diagnosis not present

## 2015-12-03 DIAGNOSIS — T888 Other specified complications of surgical and medical care, not elsewhere classified: Secondary | ICD-10-CM | POA: Diagnosis not present

## 2015-12-03 DIAGNOSIS — Z6836 Body mass index (BMI) 36.0-36.9, adult: Secondary | ICD-10-CM | POA: Diagnosis not present

## 2015-12-31 DIAGNOSIS — Z6836 Body mass index (BMI) 36.0-36.9, adult: Secondary | ICD-10-CM | POA: Diagnosis not present

## 2015-12-31 DIAGNOSIS — T888 Other specified complications of surgical and medical care, not elsewhere classified: Secondary | ICD-10-CM | POA: Diagnosis not present

## 2016-02-26 DIAGNOSIS — R399 Unspecified symptoms and signs involving the genitourinary system: Secondary | ICD-10-CM | POA: Diagnosis not present

## 2016-03-08 DIAGNOSIS — Z308 Encounter for other contraceptive management: Secondary | ICD-10-CM | POA: Diagnosis not present

## 2016-03-08 DIAGNOSIS — R102 Pelvic and perineal pain: Secondary | ICD-10-CM | POA: Diagnosis not present

## 2016-03-08 DIAGNOSIS — N898 Other specified noninflammatory disorders of vagina: Secondary | ICD-10-CM | POA: Diagnosis not present

## 2016-03-08 DIAGNOSIS — E669 Obesity, unspecified: Secondary | ICD-10-CM | POA: Diagnosis not present

## 2016-03-17 IMAGING — US US OB TRANSVAGINAL
1 series · 14 of 28 positions shown · non-contrast
Comparison: None.

CLINICAL DATA: Spotting today with cramping on off. Brown discharge
seen earlier in the week. Estimated gestational age by LMP is 6
weeks 5 days. Quantitative beta HCG is in progress.

EXAM:
OBSTETRIC <14 WK US AND TRANSVAGINAL OB US
TECHNIQUE: Both transabdominal and transvaginal ultrasound examinations were
performed for complete evaluation of the gestation as well as the
maternal uterus, adnexal regions, and pelvic cul-de-sac.
Transvaginal technique was performed to assess early pregnancy.

[Series 1: us ob comp less 14 wks · 14 of 40 slices shown]
[im 2/40]
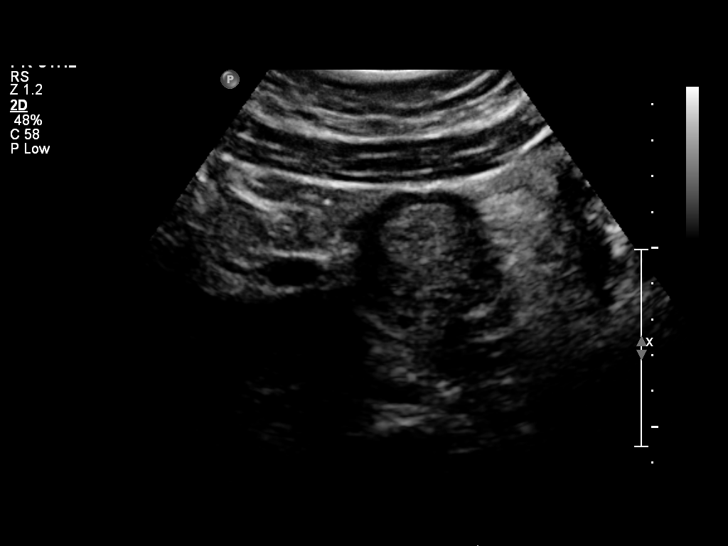
[im 5/40]
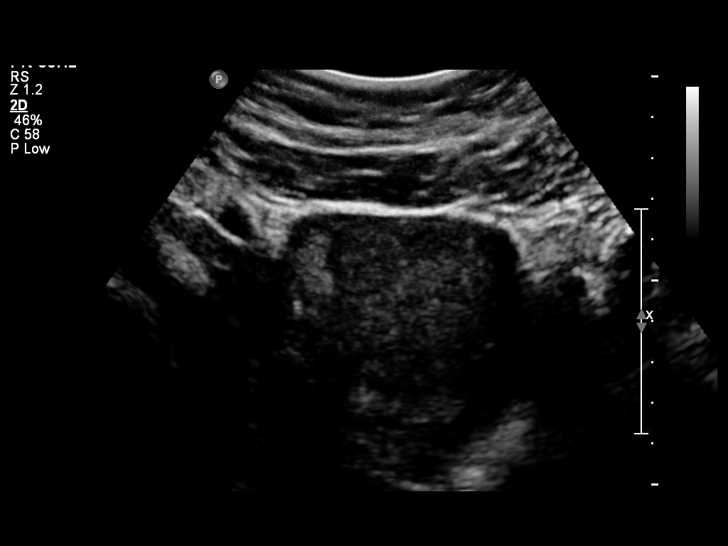
[im 8/40]
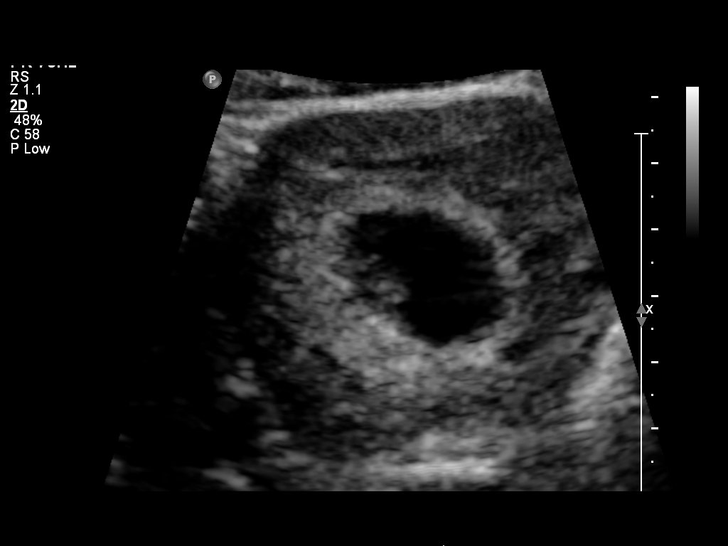
[im 11/40]
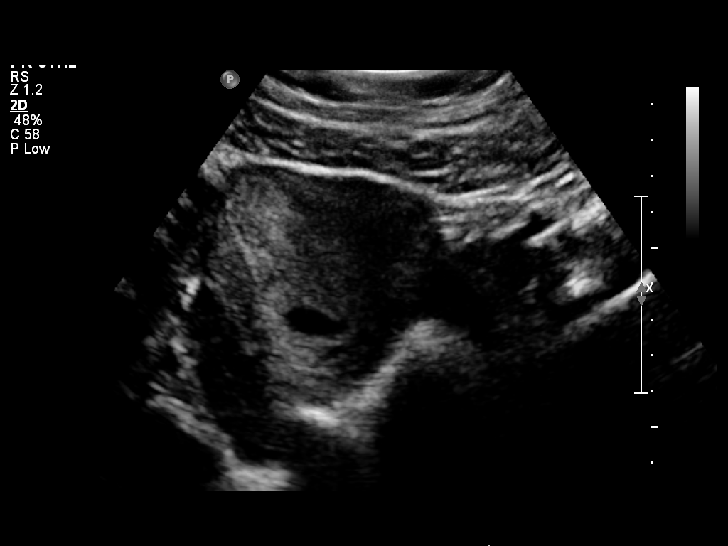
[im 14/40]
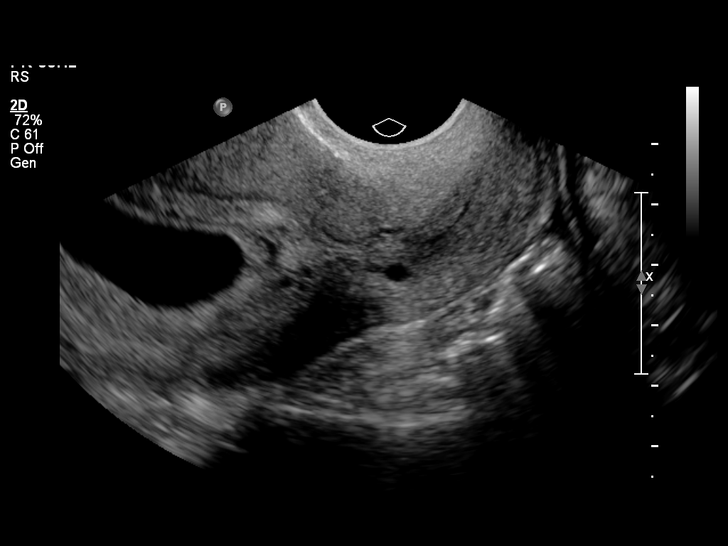
[im 16/40]
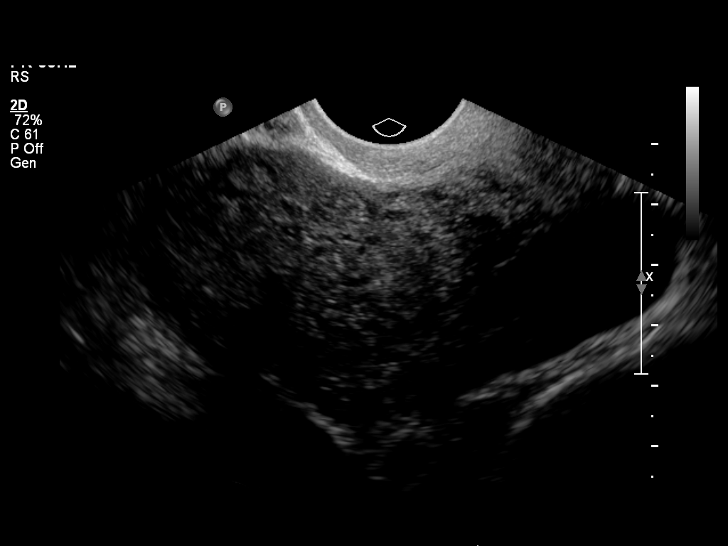
[im 19/40]
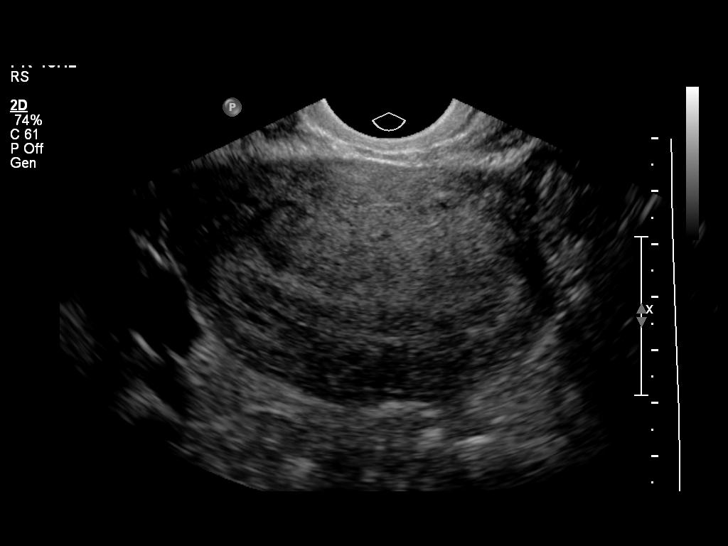
[im 22/40]
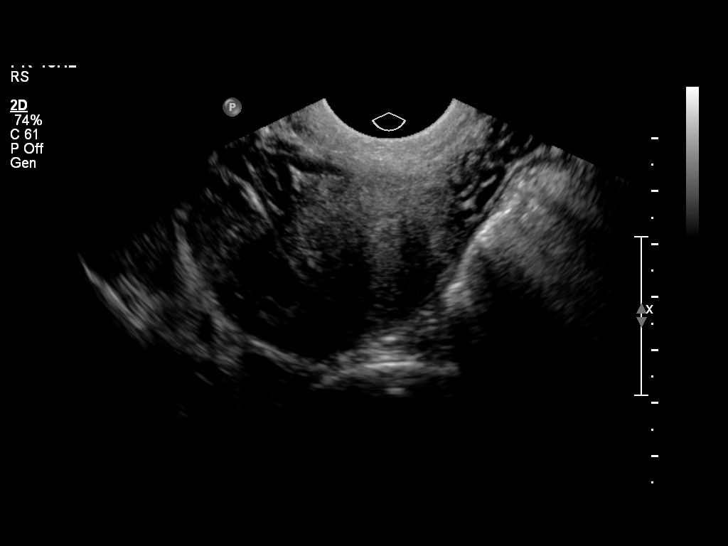
[im 25/40]
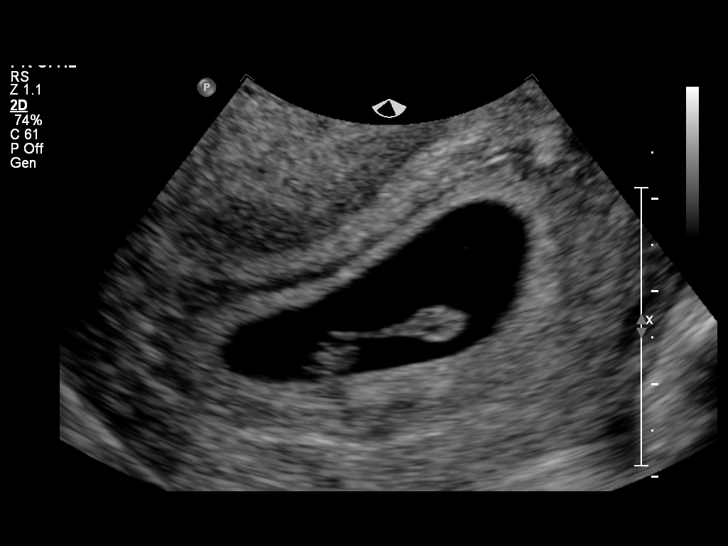
[im 28/40]
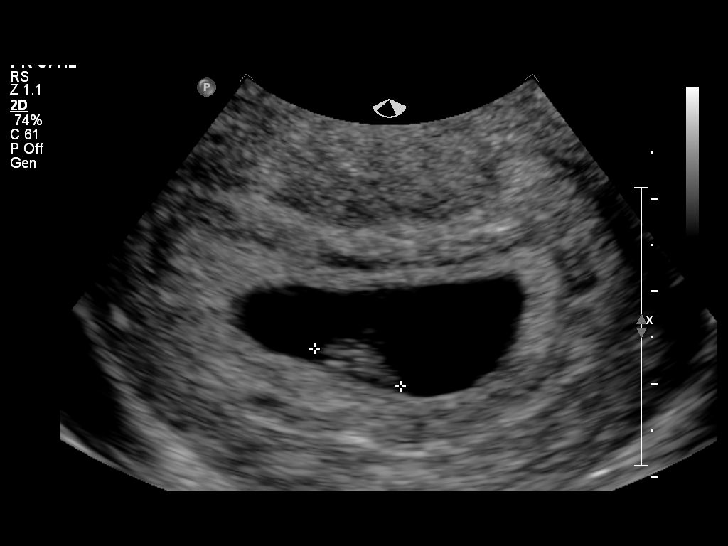
[im 31/40]
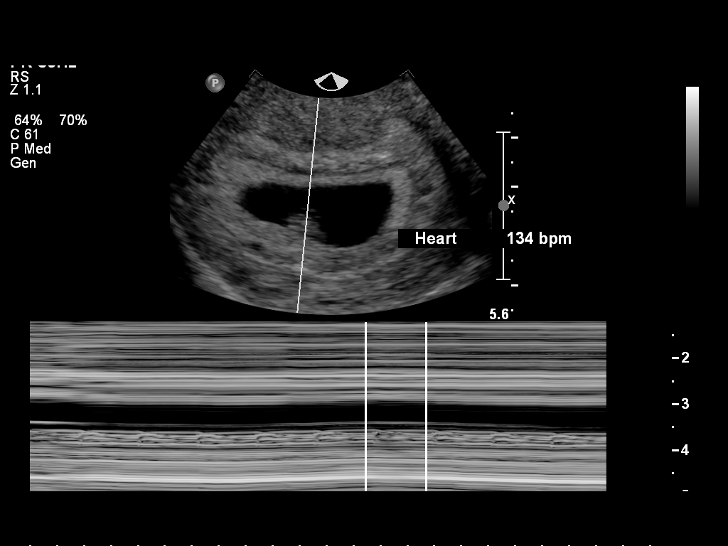
[im 34/40]
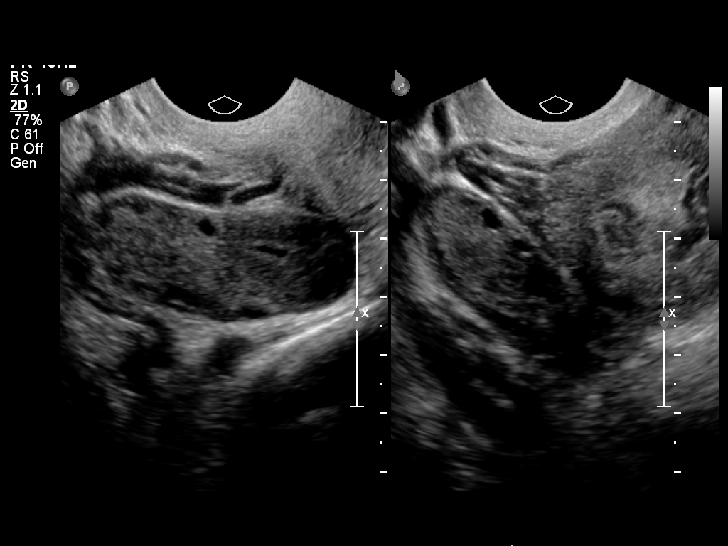
[im 37/40]
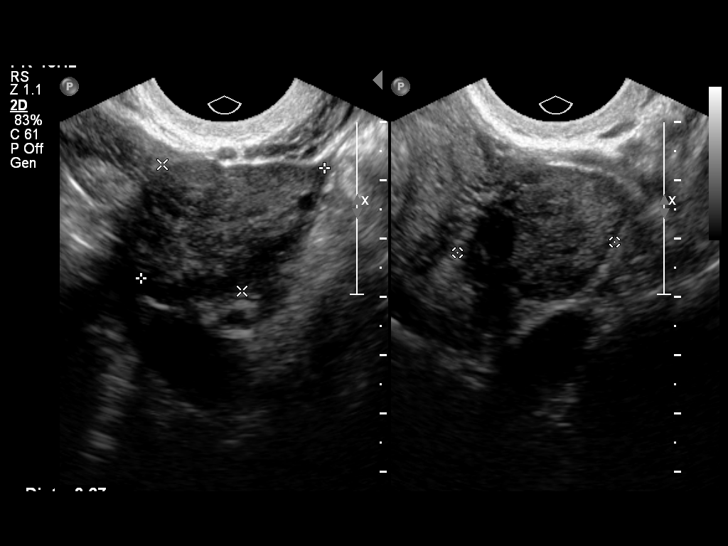
[im 40/40]
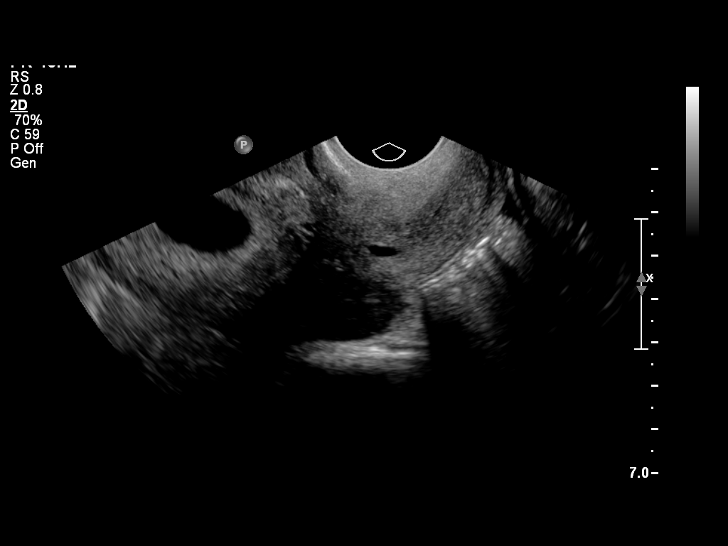

[14 of 28 positions shown; findings below may reference images not displayed]

FINDINGS: Intrauterine gestational sac: Visualized/normal in shape.

Yolk sac:  Present.

Embryo:  Present.

Cardiac Activity: Observed.

Heart Rate: 134  bpm

CRL:  10  mm   7 w   1 d                  US EDC: 02/11/2015

Maternal uterus/adnexae: The uterus is anteverted. No myometrial
mass lesions. No subchorionic hemorrhage. Small nabothian cysts in
the cervix. Both ovaries are visualized and appear normal. Corpus
luteum cyst on the right ovary. No free pelvic fluid collections.
IMPRESSION: Single intrauterine pregnancy. Estimated gestational age by
crown-rump length is 7 weeks 1 day. No acute complications
suggested.

## 2016-06-27 ENCOUNTER — Other Ambulatory Visit (HOSPITAL_COMMUNITY)
Admission: RE | Admit: 2016-06-27 | Discharge: 2016-06-27 | Disposition: A | Discharge status: Home / Self Care | Payer: Federal, State, Local not specified - PPO | Source: Ambulatory Visit | Attending: Obstetrics and Gynecology | Admitting: Obstetrics and Gynecology

## 2016-06-27 ENCOUNTER — Other Ambulatory Visit: Payer: Self-pay | Admitting: Obstetrics and Gynecology

## 2016-06-27 DIAGNOSIS — N63 Unspecified lump in unspecified breast: Secondary | ICD-10-CM

## 2016-06-27 DIAGNOSIS — R87610 Atypical squamous cells of undetermined significance on cytologic smear of cervix (ASC-US): Secondary | ICD-10-CM | POA: Diagnosis not present

## 2016-06-27 DIAGNOSIS — Z01411 Encounter for gynecological examination (general) (routine) with abnormal findings: Secondary | ICD-10-CM | POA: Insufficient documentation

## 2016-06-27 DIAGNOSIS — Z1151 Encounter for screening for human papillomavirus (HPV): Secondary | ICD-10-CM | POA: Insufficient documentation

## 2016-06-28 ENCOUNTER — Other Ambulatory Visit: Payer: Self-pay | Admitting: Obstetrics and Gynecology

## 2016-07-01 ENCOUNTER — Other Ambulatory Visit: Payer: Federal, State, Local not specified - PPO

## 2016-07-01 LAB — CYTOLOGY - PAP
Diagnosis: UNDETERMINED — AB
HPV: NOT DETECTED

## 2016-07-04 ENCOUNTER — Ambulatory Visit
Admission: RE | Admit: 2016-07-04 | Discharge: 2016-07-04 | Disposition: A | Discharge status: Home / Self Care | Payer: Federal, State, Local not specified - PPO | Source: Ambulatory Visit | Attending: Obstetrics and Gynecology | Admitting: Obstetrics and Gynecology

## 2016-07-04 ENCOUNTER — Other Ambulatory Visit: Payer: Self-pay | Admitting: Obstetrics and Gynecology

## 2016-07-04 ENCOUNTER — Ambulatory Visit: Payer: Federal, State, Local not specified - PPO

## 2016-07-04 DIAGNOSIS — N6322 Unspecified lump in the left breast, upper inner quadrant: Secondary | ICD-10-CM | POA: Diagnosis not present

## 2016-07-04 DIAGNOSIS — N63 Unspecified lump in unspecified breast: Secondary | ICD-10-CM

## 2016-07-04 DIAGNOSIS — R921 Mammographic calcification found on diagnostic imaging of breast: Secondary | ICD-10-CM | POA: Diagnosis not present

## 2016-07-04 DIAGNOSIS — D242 Benign neoplasm of left breast: Secondary | ICD-10-CM | POA: Diagnosis not present

## 2016-07-04 DIAGNOSIS — N6489 Other specified disorders of breast: Secondary | ICD-10-CM | POA: Diagnosis not present

## 2016-07-04 LAB — HM MAMMOGRAPHY

## 2017-01-06 DIAGNOSIS — K080 Exfoliation of teeth due to systemic causes: Secondary | ICD-10-CM | POA: Diagnosis not present

## 2017-03-13 DIAGNOSIS — R102 Pelvic and perineal pain: Secondary | ICD-10-CM | POA: Diagnosis not present

## 2017-03-15 DIAGNOSIS — R102 Pelvic and perineal pain: Secondary | ICD-10-CM | POA: Diagnosis not present

## 2017-04-01 DIAGNOSIS — S46812A Strain of other muscles, fascia and tendons at shoulder and upper arm level, left arm, initial encounter: Secondary | ICD-10-CM | POA: Diagnosis not present

## 2017-05-25 DIAGNOSIS — K080 Exfoliation of teeth due to systemic causes: Secondary | ICD-10-CM | POA: Diagnosis not present

## 2017-06-01 ENCOUNTER — Other Ambulatory Visit: Payer: Self-pay

## 2017-06-01 ENCOUNTER — Emergency Department (HOSPITAL_BASED_OUTPATIENT_CLINIC_OR_DEPARTMENT_OTHER)
Admission: EM | Admit: 2017-06-01 | Discharge: 2017-06-01 | Disposition: A | Discharge status: Home / Self Care | Payer: Federal, State, Local not specified - PPO | Attending: Emergency Medicine | Admitting: Emergency Medicine

## 2017-06-01 ENCOUNTER — Encounter (HOSPITAL_BASED_OUTPATIENT_CLINIC_OR_DEPARTMENT_OTHER): Payer: Self-pay | Admitting: Emergency Medicine

## 2017-06-01 DIAGNOSIS — R05 Cough: Secondary | ICD-10-CM | POA: Diagnosis not present

## 2017-06-01 DIAGNOSIS — J069 Acute upper respiratory infection, unspecified: Secondary | ICD-10-CM | POA: Insufficient documentation

## 2017-06-01 DIAGNOSIS — Z79899 Other long term (current) drug therapy: Secondary | ICD-10-CM | POA: Insufficient documentation

## 2017-06-01 MED ORDER — ALBUTEROL SULFATE HFA 108 (90 BASE) MCG/ACT IN AERS
2.0000 | INHALATION_SPRAY | Freq: Once | RESPIRATORY_TRACT | Status: AC
  Administered 2017-06-01: 2 via RESPIRATORY_TRACT
  Filled 2017-06-01: qty 6.7

## 2017-06-01 MED ORDER — BENZONATATE 100 MG PO CAPS: 100.0000 mg | ORAL_CAPSULE | Freq: Three times a day (TID) | ORAL | 0 refills | Status: DC | PRN

## 2017-06-01 NOTE — ED Notes (Signed)
ED Provider at bedside. 

## 2017-06-01 NOTE — ED Provider Notes (Signed)
[facility] EMERGENCY DEPARTMENT Provider Note   CSN: [id number] Arrival date & time: 06/01/17  0747     History   Chief Complaint Chief Complaint  Patient presents with  . URI    HPI Kiara Hall is a 37 y.o. female presenting for viral URI symptoms. She reports 4 d ays ago she developed sneezing, subsequently began having cough productive of thick white phlegm. Throat pain and chest pressure come on with cough. She has had no fevers, no wheezing, no ear pain. She denies history of asthma. She has tried using theraflu and natural remedies to help with her symptoms. She has continued to stay hydrated by drinking plenty of water. No N/V/D, no dysuria or urinary frequency. She does have a 36 year old in daycare that had viral URI symptoms.  Past Medical History:  Diagnosis Date  . Anemia   . Anxiety   . Palpitation     Patient Active Problem List   Diagnosis Date Noted  . Term pregnancy 02/09/2015  . DEPRESSION 01/17/2008  . SINUSITIS, ACUTE 01/17/2008  . PALPITATIONS 01/17/2008  . ANEMIA-IRON DEFICIENCY 12/17/2007  . GERD 12/17/2007  . Dizziness and giddiness 12/17/2007  . HEADACHE 12/17/2007  . ELEVATED BP READING WITHOUT DX HYPERTENSION 12/17/2007    Past Surgical History:  Procedure Laterality Date  . CESAREAN SECTION N/A 02/09/2015   Procedure: CESAREAN SECTION;  Surgeon: Gerald Leitz, MD;  Location: [facility];  Service: Obstetrics;  Laterality: N/A;  . TONSILLECTOMY      OB History    Gravida Para Term Preterm AB Living   1 1 1     1    SAB TAB Ectopic Multiple Live Births         0 1       Home Medications    Prior to Admission medications   Medication Sig Start Date End Date Taking? Authorizing Provider  acetaminophen (TYLENOL) 500 MG tablet Take 500 mg by mouth every 6 (six) hours as needed for mild pain or moderate pain.    [provider]  benzonatate (TESSALON PERLES) 100 MG capsule Take 1 capsule (100 mg total) by mouth 3 (three)  times daily as needed for cough. 06/01/17 06/01/18  Howard Pouch, MD  Prenatal Vit-Min-FA-Fish Oil ([employer] PRENATAL GUMMY PO) Take 2 each by mouth daily.    [provider]    Family History Family History  Problem Relation Age of Onset  . Diabetes Mother   . Hypertension Mother   . Heart disease Maternal Uncle   . Hypertension Maternal Grandfather     Social History Social History   Tobacco Use  . Smoking status: Never Smoker  . Smokeless tobacco: Never Used  Substance Use Topics  . Alcohol use: No  . Drug use: No     Allergies   Metronidazole   Review of Systems Review of Systems See HPI for ROS   Physical Exam Updated Vital Signs BP 137/81 (BP Location: Right Arm)   Pulse (!) 101   Temp 98.4 F (36.9 C) (Oral)   Resp 18   Ht 5\' 1"  (1.549 m)   Wt 83 kg (183 lb)   LMP 05/26/2017 (Approximate)   SpO2 100%   BMI 34.58 kg/m   Physical Exam  Constitutional: She is oriented to person, place, and time. She appears well-developed and well-nourished.  HENT:  Head: Normocephalic and atraumatic.  Eyes: EOM are normal.  Neck: Neck supple.  Cardiovascular: Normal rate and regular rhythm. Exam reveals  no gallop and no friction rub.  No murmur heard. Pulmonary/Chest: Effort normal and breath sounds normal. No respiratory distress. She has no wheezes. She has no rales.  Speaking in full sentences, comfortable work of breathing  Abdominal: Soft. She exhibits no distension. There is no tenderness.  Musculoskeletal: She exhibits no deformity.  Neurological: She is alert and oriented to person, place, and time.  Skin: Skin is warm and dry.  Psychiatric: She has a normal mood and affect.     ED Treatments / Results  Labs (all labs ordered are listed, but only abnormal results are displayed) Labs Reviewed - No data to display  EKG  EKG Interpretation  Date/Time:  Thursday June 01 2017 08:49:48 EST Ventricular Rate:  86 PR Interval:    QRS  Duration: 92 QT Interval:  372 QTC Calculation: 445 R Axis:   45 Text Interpretation:  Sinus rhythm No significant change since last tracing Confirmed by Alvira MondaySchlossman, Erin ([id number]) on 06/01/2017 9:03:12 AM       Radiology No results found.  Procedures Procedures (including critical care time)  Medications Ordered in ED Medications  albuterol (PROVENTIL HFA;VENTOLIN HFA) 108 (90 Base) MCG/ACT inhaler 2 puff (2 puffs Inhalation Given 06/01/17 [id number])     Initial Impression / Assessment and Plan / ED Course  I have reviewed the triage vital signs and the nursing notes.  Pertinent labs & imaging results that were available during my care of the patient were reviewed by me and considered in my medical decision making (see chart for details).     37 year old presents with viral URI symptoms. EKG was performed given patient's concern of chest tightness and this was found to be unremarkable. Recommend supportive care including tessalon perles, mucinex, continue theraflu, hydration, humidified air. Tessalon perles were prescribed and patient was given an albuterol inhaler to use PRN cough. Return precautions were discussed. She is well-appearing and considered stable for discharge.  Final Clinical Impressions(s) / ED Diagnoses   Final diagnoses:  Viral upper respiratory tract infection    ED Discharge Orders        Ordered    benzonatate (TESSALON PERLES) 100 MG capsule  3 times daily PRN     06/01/17 [id number]       Howard PouchFeng, Merrick Feutz, MD 06/01/17 [id number]    Alvira MondaySchlossman, Erin, MD 06/02/17 [phone]

## 2017-06-01 NOTE — ED Triage Notes (Signed)
Patient reports productive cough, congestion since last weekend.

## 2017-06-01 NOTE — Discharge Instructions (Addendum)
You were seen in the emergency department for your viral infection.   Reasons to return to care would be if you have shortness of breath that is not resolving, or if you are unable to stay hydrated by mouth.  Please schedule follow up to be seen by your regular doctor within the next 5-7 days if your symptoms worsen or fail to improve.

## 2017-06-26 DIAGNOSIS — K080 Exfoliation of teeth due to systemic causes: Secondary | ICD-10-CM | POA: Diagnosis not present

## 2017-06-27 ENCOUNTER — Other Ambulatory Visit: Payer: Self-pay | Admitting: Obstetrics and Gynecology

## 2017-06-27 ENCOUNTER — Other Ambulatory Visit (HOSPITAL_COMMUNITY)
Admission: RE | Admit: 2017-06-27 | Discharge: 2017-06-27 | Disposition: A | Discharge status: Home / Self Care | Payer: Federal, State, Local not specified - PPO | Source: Ambulatory Visit | Attending: Obstetrics and Gynecology | Admitting: Obstetrics and Gynecology

## 2017-06-27 DIAGNOSIS — Z124 Encounter for screening for malignant neoplasm of cervix: Secondary | ICD-10-CM | POA: Diagnosis not present

## 2017-06-27 DIAGNOSIS — Z01411 Encounter for gynecological examination (general) (routine) with abnormal findings: Secondary | ICD-10-CM | POA: Diagnosis not present

## 2017-06-27 DIAGNOSIS — L680 Hirsutism: Secondary | ICD-10-CM | POA: Diagnosis not present

## 2017-06-27 DIAGNOSIS — K5901 Slow transit constipation: Secondary | ICD-10-CM | POA: Diagnosis not present

## 2017-06-30 LAB — CYTOLOGY - PAP
Adequacy: ABSENT
Diagnosis: NEGATIVE
HPV: NOT DETECTED

## 2017-07-12 DIAGNOSIS — K080 Exfoliation of teeth due to systemic causes: Secondary | ICD-10-CM | POA: Diagnosis not present

## 2017-10-03 ENCOUNTER — Encounter (HOSPITAL_BASED_OUTPATIENT_CLINIC_OR_DEPARTMENT_OTHER): Payer: Self-pay | Admitting: *Deleted

## 2017-10-03 ENCOUNTER — Other Ambulatory Visit: Payer: Self-pay

## 2017-10-03 ENCOUNTER — Emergency Department (HOSPITAL_BASED_OUTPATIENT_CLINIC_OR_DEPARTMENT_OTHER)
Admission: EM | Admit: 2017-10-03 | Discharge: 2017-10-03 | Disposition: A | Discharge status: Home / Self Care | Payer: Federal, State, Local not specified - PPO | Attending: Emergency Medicine | Admitting: Emergency Medicine

## 2017-10-03 ENCOUNTER — Emergency Department (HOSPITAL_BASED_OUTPATIENT_CLINIC_OR_DEPARTMENT_OTHER): Payer: Federal, State, Local not specified - PPO

## 2017-10-03 DIAGNOSIS — R0602 Shortness of breath: Secondary | ICD-10-CM | POA: Diagnosis not present

## 2017-10-03 DIAGNOSIS — R1013 Epigastric pain: Secondary | ICD-10-CM | POA: Diagnosis not present

## 2017-10-03 DIAGNOSIS — Z79899 Other long term (current) drug therapy: Secondary | ICD-10-CM | POA: Insufficient documentation

## 2017-10-03 DIAGNOSIS — Z3202 Encounter for pregnancy test, result negative: Secondary | ICD-10-CM | POA: Insufficient documentation

## 2017-10-03 DIAGNOSIS — R5383 Other fatigue: Secondary | ICD-10-CM | POA: Diagnosis not present

## 2017-10-03 DIAGNOSIS — R42 Dizziness and giddiness: Secondary | ICD-10-CM | POA: Diagnosis not present

## 2017-10-03 DIAGNOSIS — R0600 Dyspnea, unspecified: Secondary | ICD-10-CM | POA: Diagnosis not present

## 2017-10-03 DIAGNOSIS — R000 Tachycardia, unspecified: Secondary | ICD-10-CM | POA: Diagnosis not present

## 2017-10-03 LAB — BASIC METABOLIC PANEL
Anion gap: 7 (ref 5–15)
BUN: 8 mg/dL (ref 6–20)
CO2: 27 mmol/L (ref 22–32)
Calcium: 9.1 mg/dL (ref 8.9–10.3)
Chloride: 102 mmol/L (ref 101–111)
Creatinine, Ser: 0.65 mg/dL (ref 0.44–1.00)
GFR calc Af Amer: >60 mL/min (ref >60)
GFR calc non Af Amer: >60 mL/min (ref >60)
Glucose, Bld: 104 mg/dL — ABNORMAL HIGH (ref 65–99)
Potassium: 3.4 mmol/L — ABNORMAL LOW (ref 3.5–5.1)
Sodium: 136 mmol/L (ref 135–145)

## 2017-10-03 LAB — CBC
HCT: 30.8 % — ABNORMAL LOW (ref 36.0–46.0)
Hemoglobin: 9.8 g/dL — ABNORMAL LOW (ref 12.0–15.0)
MCH: 17.4 pg — ABNORMAL LOW (ref 26.0–34.0)
MCHC: 31.8 g/dL (ref 30.0–36.0)
MCV: 54.6 fL — ABNORMAL LOW (ref 78.0–100.0)
Platelets: 308 10*3/uL (ref 150–400)
RBC: 5.64 MIL/uL — ABNORMAL HIGH (ref 3.87–5.11)
RDW: 28 % — ABNORMAL HIGH (ref 11.5–15.5)
WBC: 4.9 10*3/uL (ref 4.0–10.5)

## 2017-10-03 LAB — PREGNANCY, URINE: Preg Test, Ur: NEGATIVE

## 2017-10-03 LAB — D-DIMER, QUANTITATIVE: D-Dimer, Quant: 0.41 ug/mL-FEU (ref 0.00–0.50)

## 2017-10-03 MED ORDER — SODIUM CHLORIDE 0.9 % IV BOLUS
1000.0000 mL | Freq: Once | INTRAVENOUS | Status: AC
  Administered 2017-10-03: 1000 mL via INTRAVENOUS

## 2017-10-03 NOTE — ED Triage Notes (Signed)
Sob since yesterday. Recent flight to [place]. States she is anxious that she has a PE.

## 2017-10-03 NOTE — Discharge Instructions (Signed)
Please read and follow all provided instructions.  Your diagnoses today include:  1. Shortness of breath     Tests performed today include:  Test for blood clot -normal  Blood counts and electrolytes -shows continued anemia  Chest x-ray -no signs of infection  Vital signs. See below for your results today.   Medications prescribed:   None  Take any prescribed medications only as directed.  Home care instructions:  Follow any educational materials contained in this packet.  BE VERY CAREFUL not to take multiple medicines containing Tylenol (also called acetaminophen). Doing so can lead to an overdose which can damage your liver and cause liver failure and possibly death.   Follow-up instructions: Please follow-up with your primary care provider in the next 3 days for further evaluation of your symptoms if not better.   Return instructions:   Please return to the Emergency Department if you experience worsening symptoms.   Return with worsening chest pain, shortness of breath, fever.  Please return if you have any other emergent concerns.  Additional Information:  Your vital signs today were: BP 128/77    Pulse 91    Temp 99 F (37.2 C) (Oral)    Resp 12    Wt 83 kg (183 lb)    LMP 09/02/2017    SpO2 100%    BMI 34.58 kg/m  If your blood pressure (BP) was elevated above 135/85 this visit, please have this repeated by your doctor within one month. --------------

## 2017-10-03 NOTE — ED Provider Notes (Signed)
[facility] EMERGENCY DEPARTMENT Provider Note   CSN: [id number] Arrival date & time: 10/03/17  1739     History   Chief Complaint Chief Complaint  Patient presents with  . Shortness of Breath    HPI Kiara Hall is a 37 y.o. female.  HPI   Kiara Hall is a 38yo female with a history of iron deficiency anemia, anxiety, GERD and palpitations who presents to the Emergency Department for evaluation of fatigue, shortness of breath, lightheadedness.  Patient reports that she recently returned from a trip to [place] yesterday evening.  States that she has felt short of breath at rest and during ambulation since yesterday.  Also reports she has felt very tired and lightheaded when she walks.  She is worried about a pulmonary embolism given her long plane ride yesterday.  She also is reporting some intermittent burning epigastric pain, reports history of GERD.  She tried taking some Prilosec for her symptoms and reports her pain is mild now.  She states that she took iron today, but has not taken this over the past 2 weeks since she has been on vacation.  Denies history of DVT/PE, calf swelling or leg tenderness, chest pain, active cancer, exogenous estrogen, recent surgery.  She denies fevers, chills, cough, congestion, sore throat, wheezing, nausea/vomiting, diarrhea, melena, hematochezia. States that she did not get much sleep yesterday either and did not have anything to eat today and is wondering if that may be contributing to her symptoms. Reports she feels anxious.   Past Medical History:  Diagnosis Date  . Anemia   . Anxiety   . Palpitation     Patient Active Problem List   Diagnosis Date Noted  . Term pregnancy 02/09/2015  . DEPRESSION 01/17/2008  . SINUSITIS, ACUTE 01/17/2008  . PALPITATIONS 01/17/2008  . ANEMIA-IRON DEFICIENCY 12/17/2007  . GERD 12/17/2007  . Dizziness and giddiness 12/17/2007  . HEADACHE 12/17/2007  . ELEVATED BP READING WITHOUT DX HYPERTENSION  12/17/2007    Past Surgical History:  Procedure Laterality Date  . CESAREAN SECTION N/A 02/09/2015   Procedure: CESAREAN SECTION;  Surgeon: Gerald Leitz, MD;  Location: [facility];  Service: Obstetrics;  Laterality: N/A;  . TONSILLECTOMY       OB History    Gravida  1   Para  1   Term  1   Preterm      AB      Living  1     SAB      TAB      Ectopic      Multiple  0   Live Births  1            Home Medications    Prior to Admission medications   Medication Sig Start Date End Date Taking? Authorizing Provider  acetaminophen (TYLENOL) 500 MG tablet Take 500 mg by mouth every 6 (six) hours as needed for mild pain or moderate pain.    [provider]  benzonatate (TESSALON PERLES) 100 MG capsule Take 1 capsule (100 mg total) by mouth 3 (three) times daily as needed for cough. 06/01/17 06/01/18  Howard Pouch, MD  Prenatal Vit-Min-FA-Fish Oil ([employer] PRENATAL GUMMY PO) Take 2 each by mouth daily.    [provider]    Family History Family History  Problem Relation Age of Onset  . Diabetes Mother   . Hypertension Mother   . Heart disease Maternal Uncle   . Hypertension Maternal Grandfather     Social  History Social History   Tobacco Use  . Smoking status: Never Smoker  . Smokeless tobacco: Never Used  Substance Use Topics  . Alcohol use: No  . Drug use: No     Allergies   Metronidazole   Review of Systems Review of Systems  Constitutional: Positive for fatigue. Negative for chills and fever.  HENT: Negative for congestion and sore throat.   Eyes: Negative for visual disturbance.  Respiratory: Positive for shortness of breath. Negative for cough and wheezing.   Cardiovascular: Negative for chest pain.  Gastrointestinal: Positive for abdominal pain (epigastric). Negative for blood in stool, diarrhea, nausea and vomiting.  Genitourinary: Negative for difficulty urinating, dysuria, frequency and vaginal bleeding.  Musculoskeletal:  Negative for back pain.  Skin: Negative for rash.  Neurological: Positive for light-headedness. Negative for weakness, numbness and headaches.  Psychiatric/Behavioral: The patient is nervous/anxious.      Physical Exam Updated Vital Signs BP 139/69 (BP Location: Left Arm)   Pulse (!) 114   Temp 99 F (37.2 C) (Oral)   Resp 20   Wt 83 kg (183 lb)   LMP 09/02/2017   SpO2 100%   BMI 34.58 kg/m   Physical Exam  Constitutional: She is oriented to person, place, and time. She appears well-developed and well-nourished. No distress.  No acute distress.   HENT:  Head: Normocephalic and atraumatic.  Mouth/Throat: Oropharynx is clear and moist. No oropharyngeal exudate.  Eyes: Pupils are equal, round, and reactive to light. Right eye exhibits no discharge. Left eye exhibits no discharge.  Bilateral conjunctival pallor.  Neck: Normal range of motion. Neck supple.  Cardiovascular:  Tachycardic, regular rhythm.   Pulmonary/Chest: Effort normal and breath sounds normal. No stridor. No respiratory distress. She has no wheezes. She has no rales.  Abdominal: Soft. There is no tenderness.  Musculoskeletal:  No leg swelling or calf tenderness.  Neurological: She is alert and oriented to person, place, and time. Coordination normal.  Skin: Skin is warm and dry. Capillary refill takes less than 2 seconds. She is not diaphoretic.  Psychiatric: She has a normal mood and affect. Her behavior is normal.  Nursing note and vitals reviewed.    ED Treatments / Results  Labs (all labs ordered are listed, but only abnormal results are displayed) Labs Reviewed  CBC - Abnormal; Notable for the following components:      Result Value   RBC 5.64 (*)    Hemoglobin 9.8 (*)    HCT 30.8 (*)    MCV 54.6 (*)    MCH 17.4 (*)    RDW 28.0 (*)    All other components within normal limits  BASIC METABOLIC PANEL - Abnormal; Notable for the following components:   Potassium 3.4 (*)    Glucose, Bld 104 (*)      All other components within normal limits  D-DIMER, QUANTITATIVE (NOT AT [facility])  PREGNANCY, URINE    EKG EKG Interpretation  Date/Time:  Tuesday October 03 2017 19:23:36 EDT Ventricular Rate:  113 PR Interval:    QRS Duration: 86 QT Interval:  333 QTC Calculation: 457 R Axis:   21 Text Interpretation:  Sinus tachycardia Borderline T wave abnormalities No significant change was found Confirmed by Azalia Bilisampos, Kevin ([id number]) on 10/04/2017 12:40:39 PM   Radiology No results found.  Procedures Procedures (including critical care time)  Medications Ordered in ED Medications - No data to display   Initial Impression / Assessment and Plan / ED Course  I have reviewed the triage  vital signs and the nursing notes.  Pertinent labs & imaging results that were available during my care of the patient were reviewed by me and considered in my medical decision making (see chart for details).     Patient presents with fatigue, sob and lightheadedness. Recently returned from a trip to [place] yesterday. Has a history of anemia and has been off iron pills for about two weeks. On initial presentation she is tachycardic. No acute respiratory distress. O2 sat 100% RA. No leg swelling. Lungs CTA.   Given tachycardia and recent prolonged airplane travel will get ddimer, if negative doubt PE. Will also get basic labs to check her hemoglobin and electrolytes. Plan to get CXR and pregnancy test as well. Will give fluids and recheck pulse.   Ddimer negative. She is mildly anemic (Hgb 9.8), this appears to be baseline. No major electrolyte abnormalities. EKG with sinus tachycardia. Awaiting CXR and urine pregnancy. If normal and patient's heart rate improves plan to discharge home. Sign out given at shift change to PA [facility] for disposition.    Final Clinical Impressions(s) / ED Diagnoses   Final diagnoses:  None    ED Discharge Orders    None       Kellie Shropshire, PA-C 10/04/17 1329     Arby Barrette, MD 10/11/17 1138

## 2017-10-03 NOTE — ED Provider Notes (Signed)
8:56 PM Handoff from [place] PA-C at shift change.   Patient with shortness of breath.  D-dimer was negative.  Currently pending chest x-ray and fluids given her elevated heart rate.  Feel that elevated heart rate is partially due to anxiety.  Patient rechecked.  She is feeling better with fluids.  Heart rate into the 90s.  Lungs are clear.  She is comfortable with discharged home at this time.  Encourage PCP follow-up if symptoms are not improved in the next 2 to 3 days.  Encouraged return to the emergency department with worsening shortness of breath, chest pain, fever, new symptoms or other concerns.  Patient verbalizes understanding agrees with plan.  BP 128/77   Pulse 91   Temp 99 F (37.2 C) (Oral)   Resp 12   Wt 83 kg (183 lb)   LMP 09/02/2017   SpO2 100%   BMI 34.58 kg/m     Renne CriglerGeiple, Britain Saber, PA-C 10/03/17 2056    Arby BarrettePfeiffer, Marcy, MD 10/03/17 2345

## 2017-10-31 ENCOUNTER — Encounter: Payer: Self-pay | Admitting: Nurse Practitioner

## 2017-10-31 ENCOUNTER — Ambulatory Visit (INDEPENDENT_AMBULATORY_CARE_PROVIDER_SITE_OTHER): Payer: Federal, State, Local not specified - PPO | Admitting: Nurse Practitioner

## 2017-10-31 VITALS — BP 120/82 | HR 82 | Temp 98.8°F | Ht 61.0 in | Wt 183.0 lb

## 2017-10-31 DIAGNOSIS — K5900 Constipation, unspecified: Secondary | ICD-10-CM

## 2017-10-31 DIAGNOSIS — Z136 Encounter for screening for cardiovascular disorders: Secondary | ICD-10-CM | POA: Diagnosis not present

## 2017-10-31 DIAGNOSIS — Z13220 Encounter for screening for lipoid disorders: Secondary | ICD-10-CM

## 2017-10-31 DIAGNOSIS — Z0001 Encounter for general adult medical examination with abnormal findings: Secondary | ICD-10-CM

## 2017-10-31 DIAGNOSIS — E669 Obesity, unspecified: Secondary | ICD-10-CM

## 2017-10-31 DIAGNOSIS — Z6834 Body mass index (BMI) 34.0-34.9, adult: Secondary | ICD-10-CM

## 2017-10-31 LAB — CBC
HCT: 31.2 % — ABNORMAL LOW (ref 36.0–46.0)
Hemoglobin: 9.3 g/dL — ABNORMAL LOW (ref 12.0–15.0)
MCHC: 29.7 g/dL — ABNORMAL LOW (ref 30.0–36.0)
MCV: 56.2 fl — ABNORMAL LOW (ref 78.0–100.0)
Platelets: 269 10*3/uL (ref 150.0–400.0)
RBC: 5.55 Mil/uL — ABNORMAL HIGH (ref 3.87–5.11)
RDW: 27.5 % — ABNORMAL HIGH (ref 11.5–15.5)
WBC: 3.2 10*3/uL — ABNORMAL LOW (ref 4.0–10.5)

## 2017-10-31 LAB — COMPREHENSIVE METABOLIC PANEL
ALT: 10 U/L (ref 0–35)
AST: 15 U/L (ref 0–37)
Albumin: 4.1 g/dL (ref 3.5–5.2)
Alkaline Phosphatase: 39 U/L (ref 39–117)
BUN: 12 mg/dL (ref 6–23)
CO2: 26 mEq/L (ref 19–32)
Calcium: 9.3 mg/dL (ref 8.4–10.5)
Chloride: 102 mEq/L (ref 96–112)
Creatinine, Ser: 0.56 mg/dL (ref 0.40–1.20)
GFR: 157.18 mL/min (ref >60.00)
Glucose, Bld: 86 mg/dL (ref 70–99)
Potassium: 4.3 mEq/L (ref 3.5–5.1)
Sodium: 135 mEq/L (ref 135–145)
Total Bilirubin: 0.5 mg/dL (ref 0.2–1.2)
Total Protein: 7.3 g/dL (ref 6.0–8.3)

## 2017-10-31 LAB — LIPID PANEL
Cholesterol: 186 mg/dL (ref 0–200)
HDL: 85 mg/dL (ref >39.00)
LDL Cholesterol: 93 mg/dL (ref 0–99)
NonHDL: 100.62
Total CHOL/HDL Ratio: 2
Triglycerides: 37 mg/dL (ref 0.0–149.0)
VLDL: 7.4 mg/dL (ref 0.0–40.0)

## 2017-10-31 LAB — HEMOGLOBIN A1C: Hgb A1c MFr Bld: 5.4 % (ref 4.6–6.5)

## 2017-10-31 LAB — TSH: TSH: 1.61 u[IU]/mL (ref 0.35–4.50)

## 2017-10-31 MED ORDER — SENNA 8.6 MG PO TABS: 1.0000 | ORAL_TABLET | Freq: Every day | ORAL | 3 refills | Status: DC

## 2017-10-31 MED ORDER — LORCASERIN HCL ER 20 MG PO TB24: 1.0000 | ORAL_TABLET | Freq: Every day | ORAL | 0 refills | Status: DC

## 2017-10-31 NOTE — Patient Instructions (Addendum)
Cbc indicates known anemia. Continue ferrous sulfate as directed by GYN. Normal cmp, lipid panel and TSH. I recommend use of belviq for weight loss. Be aware that this medication make constipation worse. Importance to make changes to diet and increase water intake as discussed. Make 36monthf/up appt.  Constipation, Adult Constipation is when a person has fewer bowel movements in a week than normal, has difficulty having a bowel movement, or has stools that are dry, hard, or larger than normal. Constipation may be caused by an underlying condition. It may become worse with age if a person takes certain medicines and does not take in enough fluids. Follow these instructions at home: Eating and drinking   Eat foods that have a lot of fiber, such as fresh fruits and vegetables, whole grains, and beans.  Limit foods that are high in fat, low in fiber, or overly processed, such as french fries, hamburgers, cookies, candies, and soda.  Drink enough fluid to keep your urine clear or pale yellow. General instructions  Exercise regularly or as told by your health care provider.  Go to the restroom when you have the urge to go. Do not hold it in.  Take over-the-counter and prescription medicines only as told by your health care provider. These include any fiber supplements.  Practice pelvic floor retraining exercises, such as deep breathing while relaxing the lower abdomen and pelvic floor relaxation during bowel movements.  Watch your condition for any changes.  Keep all follow-up visits as told by your health care provider. This is important. Contact a health care provider if:  You have pain that gets worse.  You have a fever.  You do not have a bowel movement after 4 days.  You vomit.  You are not hungry.  You lose weight.  You are bleeding from the anus.  You have thin, pencil-like stools. Get help right away if:  You have a fever and your symptoms suddenly get worse.  You  leak stool or have blood in your stool.  Your abdomen is bloated.  You have severe pain in your abdomen.  You feel dizzy or you faint. This information is not intended to replace advice given to you by your health care provider. Make sure you discuss any questions you have with your health care provider. Document Released: 01/15/2004 Document Revised: 11/06/2015 Document Reviewed: 10/07/2015 Elsevier Interactive Patient Education  2018 [place]Maintenance, Female Adopting a healthy lifestyle and getting preventive care can go a long way to promote health and wellness. Talk with your health care provider about what schedule of regular examinations is right for you. This is a good chance for you to check in with your provider about disease prevention and staying healthy. In between checkups, there are plenty of things you can do on your own. Experts have done a lot of research about which lifestyle changes and preventive measures are most likely to keep you healthy. Ask your health care provider for more information. Weight and diet Eat a healthy diet  Be sure to include plenty of vegetables, fruits, low-fat dairy products, and lean protein.  Do not eat a lot of foods high in solid fats, added sugars, or salt.  Get regular exercise. This is one of the most important things you can do for your health. ? Most adults should exercise for at least 150 minutes each week. The exercise should increase your heart rate and make you sweat (moderate-intensity exercise). ? Most adults should also  do strengthening exercises at least twice a week. This is in addition to the moderate-intensity exercise.  Maintain a healthy weight  Body mass index (BMI) is a measurement that can be used to identify possible weight problems. It estimates body fat based on height and weight. Your health care provider can help determine your BMI and help you achieve or maintain a healthy weight.  For females  50 years of age and older: ? A BMI below 18.5 is considered underweight. ? A BMI of 18.5 to 24.9 is normal. ? A BMI of 25 to 29.9 is considered overweight. ? A BMI of 30 and above is considered obese.  Watch levels of cholesterol and blood lipids  You should start having your blood tested for lipids and cholesterol at 36 years of age, then have this test every 5 years.  You may need to have your cholesterol levels checked more often if: ? Your lipid or cholesterol levels are high. ? You are older than 36 years of age. ? You are at high risk for heart disease.  Cancer screening Lung Cancer  Lung cancer screening is recommended for adults 1-26 years old who are at high risk for lung cancer because of a history of smoking.  A yearly low-dose CT scan of the lungs is recommended for people who: ? Currently smoke. ? Have quit within the past 15 years. ? Have at least a 30-pack-year history of smoking. A pack year is smoking an average of one pack of cigarettes a day for 1 year.  Yearly screening should continue until it has been 15 years since you quit.  Yearly screening should stop if you develop a health problem that would prevent you from having lung cancer treatment.  Breast Cancer  Practice breast self-awareness. This means understanding how your breasts normally appear and feel.  It also means doing regular breast self-exams. Let your health care provider know about any changes, no matter how small.  If you are in your 20s or 30s, you should have a clinical breast exam (CBE) by a health care provider every 1-3 years as part of a regular health exam.  If you are 56 or older, have a CBE every year. Also consider having a breast X-ray (mammogram) every year.  If you have a family history of breast cancer, talk to your health care provider about genetic screening.  If you are at high risk for breast cancer, talk to your health care provider about having an MRI and a mammogram  every year.  Breast cancer gene (BRCA) assessment is recommended for women who have family members with BRCA-related cancers. BRCA-related cancers include: ? Breast. ? Ovarian. ? Tubal. ? Peritoneal cancers.  Results of the assessment will determine the need for genetic counseling and BRCA1 and BRCA2 testing.  Cervical Cancer Your health care provider may recommend that you be screened regularly for cancer of the pelvic organs (ovaries, uterus, and vagina). This screening involves a pelvic examination, including checking for microscopic changes to the surface of your cervix (Pap test). You may be encouraged to have this screening done every 3 years, beginning at age 55.  For women ages 71-65, health care providers may recommend pelvic exams and Pap testing every 3 years, or they may recommend the Pap and pelvic exam, combined with testing for human papilloma virus (HPV), every 5 years. Some types of HPV increase your risk of cervical cancer. Testing for HPV may also be done on women of any age with  unclear Pap test results.  Other health care providers may not recommend any screening for nonpregnant women who are considered low risk for pelvic cancer and who do not have symptoms. Ask your health care provider if a screening pelvic exam is right for you.  If you have had past treatment for cervical cancer or a condition that could lead to cancer, you need Pap tests and screening for cancer for at least 20 years after your treatment. If Pap tests have been discontinued, your risk factors (such as having a new sexual partner) need to be reassessed to determine if screening should resume. Some women have medical problems that increase the chance of getting cervical cancer. In these cases, your health care provider may recommend more frequent screening and Pap tests.  Colorectal Cancer  This type of cancer can be detected and often prevented.  Routine colorectal cancer screening usually begins at  36 years of age and continues through 36 years of age.  Your health care provider may recommend screening at an earlier age if you have risk factors for colon cancer.  Your health care provider may also recommend using home test kits to check for hidden blood in the stool.  A small camera at the end of a tube can be used to examine your colon directly (sigmoidoscopy or colonoscopy). This is done to check for the earliest forms of colorectal cancer.  Routine screening usually begins at age 57.  Direct examination of the colon should be repeated every 5-10 years through 37 years of age. However, you may need to be screened more often if early forms of precancerous polyps or small growths are found.  Skin Cancer  Check your skin from head to toe regularly.  Tell your health care provider about any new moles or changes in moles, especially if there is a change in a mole's shape or color.  Also tell your health care provider if you have a mole that is larger than the size of a pencil eraser.  Always use sunscreen. Apply sunscreen liberally and repeatedly throughout the day.  Protect yourself by wearing long sleeves, pants, a wide-brimmed hat, and sunglasses whenever you are outside.  Heart disease, diabetes, and high blood pressure  High blood pressure causes heart disease and increases the risk of stroke. High blood pressure is more likely to develop in: ? People who have blood pressure in the high end of the normal range (130-139/85-89 mm Hg). ? People who are overweight or obese. ? People who are African American.  If you are 61-81 years of age, have your blood pressure checked every 3-5 years. If you are 87 years of age or older, have your blood pressure checked every year. You should have your blood pressure measured twice-once when you are at a hospital or clinic, and once when you are not at a hospital or clinic. Record the average of the two measurements. To check your blood  pressure when you are not at a hospital or clinic, you can use: ? An automated blood pressure machine at a pharmacy. ? A home blood pressure monitor.  If you are between 31 years and 43 years old, ask your health care provider if you should take aspirin to prevent strokes.  Have regular diabetes screenings. This involves taking a blood sample to check your fasting blood sugar level. ? If you are at a normal weight and have a low risk for diabetes, have this test once every three years after 36 years  of age. ? If you are overweight and have a high risk for diabetes, consider being tested at a younger age or more often. Preventing infection Hepatitis B  If you have a higher risk for hepatitis B, you should be screened for this virus. You are considered at high risk for hepatitis B if: ? You were born in a country where hepatitis B is common. Ask your health care provider which countries are considered high risk. ? Your parents were born in a high-risk country, and you have not been immunized against hepatitis B (hepatitis B vaccine). ? You have HIV or AIDS. ? You use needles to inject street drugs. ? You live with someone who has hepatitis B. ? You have had sex with someone who has hepatitis B. ? You get hemodialysis treatment. ? You take certain medicines for conditions, including cancer, organ transplantation, and autoimmune conditions.  Hepatitis C  Blood testing is recommended for: ? Everyone born from [birth year] through 1964. ? Anyone with known risk factors for hepatitis C.  Sexually transmitted infections (STIs)  You should be screened for sexually transmitted infections (STIs) including gonorrhea and chlamydia if: ? You are sexually active and are younger than 36 years of age. ? You are older than 36 years of age and your health care provider tells you that you are at risk for this type of infection. ? Your sexual activity has changed since you were last screened and you are at an  increased risk for chlamydia or gonorrhea. Ask your health care provider if you are at risk.  If you do not have HIV, but are at risk, it may be recommended that you take a prescription medicine daily to prevent HIV infection. This is called pre-exposure prophylaxis (PrEP). You are considered at risk if: ? You are sexually active and do not regularly use condoms or know the HIV status of your partner(s). ? You take drugs by injection. ? You are sexually active with a partner who has HIV.  Talk with your health care provider about whether you are at high risk of being infected with HIV. If you choose to begin PrEP, you should first be tested for HIV. You should then be tested every 3 months for as long as you are taking PrEP. Pregnancy  If you are premenopausal and you may become pregnant, ask your health care provider about preconception counseling.  If you may become pregnant, take 400 to 800 micrograms (mcg) of folic acid every day.  If you want to prevent pregnancy, talk to your health care provider about birth control (contraception). Osteoporosis and menopause  Osteoporosis is a disease in which the bones lose minerals and strength with aging. This can result in serious bone fractures. Your risk for osteoporosis can be identified using a bone density scan.  If you are 42 years of age or older, or if you are at risk for osteoporosis and fractures, ask your health care provider if you should be screened.  Ask your health care provider whether you should take a calcium or vitamin D supplement to lower your risk for osteoporosis.  Menopause may have certain physical symptoms and risks.  Hormone replacement therapy may reduce some of these symptoms and risks. Talk to your health care provider about whether hormone replacement therapy is right for you. Follow these instructions at home:  Schedule regular health, dental, and eye exams.  Stay current with your immunizations.  Do not use  any tobacco products including cigarettes, chewing tobacco,  or electronic cigarettes.  If you are pregnant, do not drink alcohol.  If you are breastfeeding, limit how much and how often you drink alcohol.  Limit alcohol intake to no more than 1 drink per day for nonpregnant women. One drink equals 12 ounces of beer, 5 ounces of wine, or 1 ounces of hard liquor.  Do not use street drugs.  Do not share needles.  Ask your health care provider for help if you need support or information about quitting drugs.  Tell your health care provider if you often feel depressed.  Tell your health care provider if you have ever been abused or do not feel safe at home. This information is not intended to replace advice given to you by your health care provider. Make sure you discuss any questions you have with your health care provider. Document Released: 11/01/2010 Document Revised: 09/24/2015 Document Reviewed: 01/20/2015 Elsevier Interactive Patient Education  [employer].

## 2017-10-31 NOTE — Progress Notes (Signed)
Subjective:    Patient ID: Kiara Hall, female    DOB: 06/04/1980, 37 y.o.   MRN: [id number]  Patient presents today for complete physical  Constipation  This is a chronic problem. The current episode started more than 1 year ago. The problem has been waxing and waning since onset. Her stool frequency is 1 time per week or less. The stool is described as pellet like. The patient is not on a high fiber diet. She exercises regularly. There has not been adequate water intake. Associated symptoms include bloating. Pertinent negatives include no abdominal pain, anorexia, back pain, diarrhea, difficulty urinating, fecal incontinence, fever, flatus, hematochezia, hemorrhoids, melena, nausea, rectal pain, vomiting or weight loss. Risk factors include obesity. She has tried laxatives for the symptoms. The treatment provided moderate relief.   Last seen by pcp 358yrs ago.  Here for CPE, denies any acute complains Will like to discuss use of weight loss medication.  reports increased appetite and constantly craving carbs.  Lost 50Lbs with use of phentermine 3858yrs ago, but did not like side effects (palpitations, drowsiness, irritability)  Immunizations: (TDAP, Hep C screen, Pneumovax, Influenza, zoster)  Health Maintenance  Topic Date Due  . Flu Shot  11/30/2017  . Pap Smear  06/27/2020  . Tetanus Vaccine  06/30/2024  . HIV Screening  Completed   Diet:keto diet.  Weight:  Wt Readings from Last 3 Encounters:  10/31/17 183 lb (83 kg)  10/03/17 183 lb (83 kg)  06/01/17 183 lb (83 kg)    Exercise:3x/week, walking 30-7345mins.  Fall Risk: Fall Risk  10/31/2017  Falls in the past year? No   Home Safety:home with son (5658yrs ago).  Depression/Suicide[facility] 10/31/2017  Decreased Interest 0  Down, Depressed, Hopeless 0  PHQ - 2 Score 0   Pap Smear (every 7959yrs for >21-29 without HPV, every 2020yrs for >30-73620yrs with HPV):up to date per patient, done by GYN, last seen 05/2017,  normal PAP per patient.  Vision:up to date.  Dental:up to date.  Advanced Directive: Advanced Directives 10/03/2017  Does Patient Have a Medical Advance Directive? No  Would patient like information on creating a medical advance directive? No - Patient declined   Sexual History (birth control, marital status, STD):single, sexually active with female, use of condoms  Medications and allergies reviewed with patient and updated if appropriate.  Patient Active Problem List   Diagnosis Date Noted  . Term pregnancy 02/09/2015  . DEPRESSION 01/17/2008  . SINUSITIS, ACUTE 01/17/2008  . PALPITATIONS 01/17/2008  . ANEMIA-IRON DEFICIENCY 12/17/2007  . GERD 12/17/2007  . Dizziness and giddiness 12/17/2007  . HEADACHE 12/17/2007  . ELEVATED BP READING WITHOUT DX HYPERTENSION 12/17/2007    Current Outpatient Medications on File Prior to Visit  Medication Sig Dispense Refill  . Fe Fum-FePoly-Vit C-Vit B3 (INTEGRA PO) Take by mouth daily as needed.    . Mag Aspart-Potassium Aspart (POTASSIUM & MAGNESIUM ASPARTAT PO) Take by mouth.    . Prenatal Vit-Min-FA-Fish Oil ([employer] PRENATAL GUMMY PO) Take 2 each by mouth daily.     No current facility-administered medications on file prior to visit.     Past Medical History:  Diagnosis Date  . Anemia   . Anxiety   . Chicken pox   . GERD (gastroesophageal reflux disease)   . Palpitation   . Palpitation     Past Surgical History:  Procedure Laterality Date  . BREAST BIOPSY Left    benign   . CESAREAN SECTION N/A 02/09/2015  Procedure: CESAREAN SECTION;  Surgeon: Gerald Leitz, MD;  Location: [facility];  Service: Obstetrics;  Laterality: N/A;  . TONSILLECTOMY    . TONSILLECTOMY      Social History   Socioeconomic History  . Marital status: Single    Spouse name: Not on file  . Number of children: Not on file  . Years of education: Not on file  . Highest education level: Not on file  Occupational History  . Not on file  Social Needs  .  Financial resource strain: Not on file  . Food insecurity:    Worry: Not on file    Inability: Not on file  . Transportation needs:    Medical: Not on file    Non-medical: Not on file  Tobacco Use  . Smoking status: Never Smoker  . Smokeless tobacco: Never Used  Substance and Sexual Activity  . Alcohol use: Yes    Comment: very litle  . Drug use: No  . Sexual activity: Yes    Birth control/protection: Condom  Lifestyle  . Physical activity:    Days per week: Not on file    Minutes per session: Not on file  . Stress: Not on file  Relationships  . Social connections:    Talks on phone: Not on file    Gets together: Not on file    Attends religious service: Not on file    Active member of club or organization: Not on file    Attends meetings of clubs or organizations: Not on file    Relationship status: Not on file  Other Topics Concern  . Not on file  Social History Narrative  . Not on file    Family History  Problem Relation Age of Onset  . Diabetes Mother   . Hypertension Mother   . Heart disease Maternal Uncle 43  . Hypertension Maternal Grandfather   . Miscarriages / Stillbirths Sister   . Hypertension Maternal Aunt   . Stroke Maternal Grandmother         Review of Systems  Constitutional: Negative for fever, malaise/fatigue and weight loss.  HENT: Negative for congestion and sore throat.   Eyes:       Negative for visual changes  Respiratory: Negative for cough and shortness of breath.   Cardiovascular: Negative for chest pain, palpitations and leg swelling.  Gastrointestinal: Positive for bloating. Negative for abdominal pain, anorexia, blood in stool, constipation, diarrhea, flatus, heartburn, hematochezia, hemorrhoids, melena, nausea, rectal pain and vomiting.  Genitourinary: Negative for difficulty urinating, dysuria, frequency and urgency.  Musculoskeletal: Negative for back pain, falls, joint pain and myalgias.  Skin: Negative for rash.    Neurological: Negative for dizziness, sensory change and headaches.  Endo/Heme/Allergies: Does not bruise/bleed easily.  Psychiatric/Behavioral: Negative for depression, substance abuse and suicidal ideas. The patient is not nervous/anxious.     Objective:   Vitals:   10/31/17 1011  BP: 120/82  Pulse: 82  Temp: 98.8 F (37.1 C)  SpO2: 98%    Body mass index is 34.58 kg/m.   Physical Examination:  Physical Exam  Constitutional: She is oriented to person, place, and time. No distress.  HENT:  Right Ear: External ear normal.  Left Ear: External ear normal.  Nose: Nose normal.  Mouth/Throat: No oropharyngeal exudate.  Eyes: No scleral icterus.  Neck: Normal range of motion. Neck supple.  Cardiovascular: Normal rate, regular rhythm and normal heart sounds.  Pulmonary/Chest: Effort normal and breath sounds normal. No respiratory distress.  Abdominal: Soft.  Bowel sounds are normal. She exhibits no distension. There is no tenderness.  Genitourinary:  Genitourinary Comments: Deferred breast and pelvic exam to GYN  Musculoskeletal: Normal range of motion. She exhibits no edema.  Lymphadenopathy:    She has no cervical adenopathy.  Neurological: She is alert and oriented to person, place, and time.  Skin: Skin is warm and dry.  Psychiatric: She has a normal mood and affect. Her behavior is normal.    ASSESSMENT and PLAN:  Saleen was seen today for establish care.  Diagnoses and all orders for this visit:  Encounter for preventative adult health care exam with abnormal findings -     CBC -     Comprehensive metabolic panel -     Lipid panel  Constipation, unspecified constipation type -     TSH -     senna (SENOKOT) 8.6 MG TABS tablet; Take 1 tablet (8.6 mg total) by mouth at bedtime.  Encounter for lipid screening for cardiovascular disease -     Lipid panel  Class 1 obesity without serious comorbidity with body mass index (BMI) of 34.0 to 34.9 in adult,  unspecified obesity type -     TSH -     Hemoglobin A1c   No problem-specific Assessment & Plan notes found for this encounter.     Follow up: Return in about 1 month (around 11/28/2017) for obesity and constipation.  Alysia Penna, NP

## 2017-11-03 ENCOUNTER — Telehealth: Payer: Self-pay | Admitting: Nurse Practitioner

## 2017-11-03 NOTE — Telephone Encounter (Signed)
Please advise 

## 2017-11-03 NOTE — Telephone Encounter (Signed)
Phentermine is the cheapest option but I do not know how much cheaper.

## 2017-11-03 NOTE — Telephone Encounter (Signed)
Spoke with the pt, advise her of message below but she didn't really want to go back on phentermine again. She is going to check with the insurance comp and call back with what they might cover.

## 2017-11-03 NOTE — Telephone Encounter (Signed)
Copied from CRM [phone]#[id number]. Topic: Quick Communication - See Telephone Encounter >> Nov 03, 2017  2:35 PM Lorrine KinMcGee, Kymia Simi B, NT wrote: CRM for notification. See Telephone encounter for: 11/03/17. Patient calling and wanted to know if something else could be called in for Lorcaserin HCl ER (BELVIQ XR) 20 MG TB24? States that with a coupon she would have to pay $165. Would like something cheaper. CB#: [phone]

## 2017-12-04 ENCOUNTER — Encounter: Payer: Self-pay | Admitting: Nurse Practitioner

## 2017-12-04 ENCOUNTER — Ambulatory Visit: Payer: Federal, State, Local not specified - PPO | Admitting: Nurse Practitioner

## 2017-12-04 DIAGNOSIS — E669 Obesity, unspecified: Secondary | ICD-10-CM

## 2017-12-04 DIAGNOSIS — K5900 Constipation, unspecified: Secondary | ICD-10-CM | POA: Diagnosis not present

## 2017-12-04 DIAGNOSIS — Z6834 Body mass index (BMI) 34.0-34.9, adult: Secondary | ICD-10-CM

## 2017-12-04 MED ORDER — SENNA 8.6 MG PO TABS: 1.0000 | ORAL_TABLET | Freq: Every evening | ORAL | 3 refills | Status: DC | PRN

## 2017-12-04 MED ORDER — LORCASERIN HCL ER 20 MG PO TB24: 1.0000 | ORAL_TABLET | Freq: Every day | ORAL | 1 refills | Status: DC

## 2017-12-04 NOTE — Patient Instructions (Addendum)
She will like to continue medication at this time, since side effects are mild.  You may also contact Dr. Francena HanlyBeasley's office to coach with diet and lifestyle [phone].  Continue with belviq at this time. Let me know if side effects worsen.  Continue to diet change changes, portion control and regular exercise.

## 2017-12-04 NOTE — Progress Notes (Signed)
Subjective:  Patient ID: Kiara Hall, female    DOB: 04-20-1981  Age: 37 y.o. MRN: [id number]  CC: Follow-up (follow up on weight loss. constipation is beter using fiber, she never got senokot from drug store. )  HPI   Obesity: Use of belviq x4weeks. side effects(fatigue, vivid dreams, and headache), describes as intermittent and mild. Has not been consistent with diet changes and exercise. No weight loss at this time. Wt Readings from Last 3 Encounters:  12/04/17 185 lb (83.9 kg)  10/31/17 183 lb (83 kg)  10/03/17 183 lb (83 kg)   Reviewed past Medical, Social and Family history today.  Outpatient Medications Prior to Visit  Medication Sig Dispense Refill  . Fe Fum-FePoly-Vit C-Vit B3 (INTEGRA PO) Take by mouth daily as needed.    . Mag Aspart-Potassium Aspart (POTASSIUM & MAGNESIUM ASPARTAT PO) Take by mouth.    . Lorcaserin HCl ER (BELVIQ XR) 20 MG TB24 Take 1 tablet by mouth daily after breakfast. 30 tablet 0  . Prenatal Vit-Min-FA-Fish Oil ([employer] PRENATAL GUMMY PO) Take 2 each by mouth daily.    Marland Kitchen senna (SENOKOT) 8.6 MG TABS tablet Take 1 tablet (8.6 mg total) by mouth at bedtime. (Patient not taking: Reported on 12/04/2017) 30 each 3   No facility-administered medications prior to visit.     ROS See HPI  Objective:  BP 116/80   Pulse 71   Temp (!) 97.3 F (36.3 C) (Rectal)   Ht 5\' 1"  (1.549 m)   Wt 185 lb (83.9 kg)   SpO2 98%   BMI 34.96 kg/m   BP Readings from Last 3 Encounters:  12/04/17 116/80  10/31/17 120/82  10/03/17 124/78    Wt Readings from Last 3 Encounters:  12/04/17 185 lb (83.9 kg)  10/31/17 183 lb (83 kg)  10/03/17 183 lb (83 kg)    Physical Exam  Constitutional: She is oriented to person, place, and time.  Cardiovascular: Normal rate, regular rhythm and normal heart sounds.  Pulmonary/Chest: Effort normal.  Musculoskeletal: She exhibits no edema.  Neurological: She is alert and oriented to person, place, and time.  Skin: Skin is warm  and dry.  Psychiatric: She has a normal mood and affect. Her behavior is normal. Thought content normal.  Vitals reviewed.   Lab Results  Component Value Date   WBC 3.2 (L) 10/31/2017   HGB 9.3 (L) 10/31/2017   HCT 31.2 (L) 10/31/2017   PLT 269.0 Repeated and verified X2. 10/31/2017   GLUCOSE 86 10/31/2017   CHOL 186 10/31/2017   TRIG 37.0 10/31/2017   HDL 85.00 10/31/2017   LDLCALC 93 10/31/2017   ALT 10 10/31/2017   AST 15 10/31/2017   NA 135 10/31/2017   K 4.3 10/31/2017   CL 102 10/31/2017   CREATININE 0.56 10/31/2017   BUN 12 10/31/2017   CO2 26 10/31/2017   TSH 1.61 10/31/2017   HGBA1C 5.4 10/31/2017    Dg Chest 2 View  Result Date: 10/03/2017 CLINICAL DATA:  Dyspnea EXAM: CHEST - 2 VIEW COMPARISON:  02/03/2014 chest radiograph. FINDINGS: Stable cardiomediastinal silhouette with normal heart size. No pneumothorax. No pleural effusion. Lungs appear clear, with no acute consolidative airspace disease and no pulmonary edema. IMPRESSION: No active cardiopulmonary disease. Electronically Signed   By: Delbert Phenix M.D.   On: 10/03/2017 20:18    Assessment & Plan:   Fani was seen today for follow-up.  Diagnoses and all orders for this visit:  Class 1 obesity without serious comorbidity  with body mass index (BMI) of 34.0 to 34.9 in adult, unspecified obesity type -     Lorcaserin HCl ER (BELVIQ XR) 20 MG TB24; Take 1 tablet by mouth daily after breakfast.  Constipation, unspecified constipation type -     senna (SENOKOT) 8.6 MG TABS tablet; Take 1 tablet (8.6 mg total) by mouth at bedtime as needed for mild constipation.   I have changed Tiara P. Brimmer's senna. I am also having her maintain her Prenatal Vit-Min-FA-Fish Oil ([facility]S PRENATAL GUMMY PO), Fe Fum-FePoly-Vit C-Vit B3 (INTEGRA PO), Mag Aspart-Potassium Aspart (POTASSIUM & MAGNESIUM ASPARTAT PO), and Lorcaserin HCl ER.  Meds ordered this encounter  Medications  . Lorcaserin HCl ER (BELVIQ XR) 20 MG TB24    Sig:  Take 1 tablet by mouth daily after breakfast.    Dispense:  30 tablet    Refill:  1    Order Specific Question:   Supervising Provider    Answer:   Dianne DunARON, TALIA M [[id number]]  . senna (SENOKOT) 8.6 MG TABS tablet    Sig: Take 1 tablet (8.6 mg total) by mouth at bedtime as needed for mild constipation.    Dispense:  30 each    Refill:  3    Order Specific Question:   Supervising Provider    Answer:   Dianne DunARON, TALIA M [[id number]]    Follow-up: Return in about 2 months (around 02/03/2018) for weight loss.  Alysia Pennaharlotte Nche, NP

## 2017-12-05 ENCOUNTER — Encounter: Payer: Self-pay | Admitting: Nurse Practitioner

## 2018-01-08 ENCOUNTER — Ambulatory Visit: Payer: Federal, State, Local not specified - PPO | Admitting: Nurse Practitioner

## 2018-01-08 ENCOUNTER — Encounter: Payer: Self-pay | Admitting: Nurse Practitioner

## 2018-01-08 VITALS — BP 128/86 | HR 94 | Temp 98.7°F | Ht 61.0 in | Wt 185.0 lb

## 2018-01-08 DIAGNOSIS — K2100 Gastro-esophageal reflux disease with esophagitis, without bleeding: Secondary | ICD-10-CM

## 2018-01-08 DIAGNOSIS — D509 Iron deficiency anemia, unspecified: Secondary | ICD-10-CM

## 2018-01-08 DIAGNOSIS — R1013 Epigastric pain: Secondary | ICD-10-CM | POA: Diagnosis not present

## 2018-01-08 DIAGNOSIS — R5383 Other fatigue: Secondary | ICD-10-CM

## 2018-01-08 LAB — IBC PANEL
Iron: 20 ug/dL — ABNORMAL LOW (ref 42–145)
Saturation Ratios: 3.9 % — ABNORMAL LOW (ref 20.0–50.0)
Transferrin: 365 mg/dL — ABNORMAL HIGH (ref 212.0–360.0)

## 2018-01-08 LAB — CBC
HCT: 31.6 % — ABNORMAL LOW (ref 36.0–46.0)
Hemoglobin: 9.5 g/dL — ABNORMAL LOW (ref 12.0–15.0)
MCHC: 30.1 g/dL (ref 30.0–36.0)
MCV: 56.7 fl — ABNORMAL LOW (ref 78.0–100.0)
Platelets: 222 10*3/uL (ref 150.0–400.0)
RBC: 5.58 Mil/uL — ABNORMAL HIGH (ref 3.87–5.11)
RDW: 27.8 % — ABNORMAL HIGH (ref 11.5–15.5)
WBC: 2.6 10*3/uL — ABNORMAL LOW (ref 4.0–10.5)

## 2018-01-08 MED ORDER — GI COCKTAIL ~~LOC~~
30.0000 mL | Freq: Once | ORAL | Status: AC
  Administered 2018-01-08: 30 mL via ORAL

## 2018-01-08 MED ORDER — RANITIDINE HCL 150 MG PO TABS: 150.0000 mg | ORAL_TABLET | Freq: Every day | ORAL | 2 refills | Status: DC

## 2018-01-08 MED ORDER — OMEPRAZOLE 20 MG PO CPDR: 20.0000 mg | DELAYED_RELEASE_CAPSULE | Freq: Every day | ORAL | 2 refills | Status: DC

## 2018-01-08 NOTE — Patient Instructions (Addendum)
Stool is negative for H. Pylori. CBC and IBC indicates iron deficient anemia. Advised to take supplement instead of as needed.  Food Choices for Gastroesophageal Reflux Disease, Adult When you have gastroesophageal reflux disease (GERD), the foods you eat and your eating habits are very important. Choosing the right foods can help ease your discomfort. What guidelines do I need to follow?  Choose fruits, vegetables, whole grains, and low-fat dairy products.  Choose low-fat meat, fish, and poultry.  Limit fats such as oils, salad dressings, butter, nuts, and avocado.  Keep a food diary. This helps you identify foods that cause symptoms.  Avoid foods that cause symptoms. These may be different for everyone.  Eat small meals often instead of 3 large meals a day.  Eat your meals slowly, in a place where you are relaxed.  Limit fried foods.  Cook foods using methods other than frying.  Avoid drinking alcohol.  Avoid drinking large amounts of liquids with your meals.  Avoid bending over or lying down until 2-3 hours after eating. What foods are not recommended? These are some foods and drinks that may make your symptoms worse: Vegetables Tomatoes. Tomato juice. Tomato and spaghetti sauce. Chili peppers. Onion and garlic. Horseradish. Fruits Oranges, grapefruit, and lemon (fruit and juice). Meats High-fat meats, fish, and poultry. This includes hot dogs, ribs, ham, sausage, salami, and bacon. Dairy Whole milk and chocolate milk. Sour cream. Cream. Butter. Ice cream. Cream cheese. Drinks Coffee and tea. Bubbly (carbonated) drinks or energy drinks. Condiments Hot sauce. Barbecue sauce. Sweets/Desserts Chocolate and cocoa. Donuts. Peppermint and spearmint. Fats and Oils High-fat foods. This includes [place] fries and potato chips. Other Vinegar. Strong spices. This includes black pepper, white pepper, red pepper, cayenne, curry powder, cloves, ginger, and chili powder. The  items listed above may not be a complete list of foods and drinks to avoid. Contact your dietitian for more information. This information is not intended to replace advice given to you by your health care provider. Make sure you discuss any questions you have with your health care provider. Document Released: 10/18/2011 Document Revised: 09/24/2015 Document Reviewed: 02/20/2013 Elsevier Interactive Patient Education  2017 [employer].

## 2018-01-08 NOTE — Progress Notes (Signed)
Subjective:  Patient ID: Kiara Hall, female    DOB: 01/07/1981  Age: 37 y.o. MRN: [id number]  CC: Heartburn (patient is experiencing acid reflux,patient is on Keto diet, this has got worse 1 wk ago. took otc prilosec)  Gastroesophageal Reflux  She complains of abdominal pain, belching, coughing, globus sensation, heartburn, a hoarse voice, nausea, a sore throat and water brash. She reports no chest pain, no choking, no dysphagia, no early satiety, no stridor, no tooth decay or no wheezing. This is a chronic problem. The current episode started more than 1 year ago. The problem occurs frequently. The problem has been waxing and waning. The heartburn is located in the abdomen and substernum. The heartburn is of moderate intensity. The heartburn does not wake her from sleep. The heartburn does not limit her activity. The heartburn doesn't change with position. The symptoms are aggravated by certain foods, caffeine and lying down. Associated symptoms include anemia and fatigue. Pertinent negatives include no muscle weakness, orthopnea or weight loss. She has tried a PPI for the symptoms. The treatment provided moderate relief. Past procedures do not include an abdominal ultrasound, an EGD or H. pylori antibody titer.  reports dark stool due to use of ferrous sulfate as needed.  Following Keto diet at this time.  Reviewed past Medical, Social and Family history today.  Outpatient Medications Prior to Visit  Medication Sig Dispense Refill  . Fe Fum-FePoly-Vit C-Vit B3 (INTEGRA PO) Take by mouth daily as needed.    . Mag Aspart-Potassium Aspart (POTASSIUM & MAGNESIUM ASPARTAT PO) Take by mouth.    . senna (SENOKOT) 8.6 MG TABS tablet Take 1 tablet (8.6 mg total) by mouth at bedtime as needed for mild constipation. 30 each 3  . Lorcaserin HCl ER (BELVIQ XR) 20 MG TB24 Take 1 tablet by mouth daily after breakfast. (Patient not taking: Reported on 01/08/2018) 30 tablet 1  . Prenatal Vit-Min-FA-Fish Oil  ([employer] PRENATAL GUMMY PO) Take 2 each by mouth daily.     No facility-administered medications prior to visit.     ROS See HPI  Objective:  BP 128/86   Pulse 94   Temp 98.7 F (37.1 C) (Oral)   Ht 5\' 1"  (1.549 m)   Wt 185 lb (83.9 kg)   LMP 12/25/2017 (Exact Date)   SpO2 99%   BMI 34.96 kg/m   BP Readings from Last 3 Encounters:  01/08/18 128/86  12/04/17 116/80  10/31/17 120/82    Wt Readings from Last 3 Encounters:  01/08/18 185 lb (83.9 kg)  12/04/17 185 lb (83.9 kg)  10/31/17 183 lb (83 kg)    Physical Exam  Constitutional: She is oriented to person, place, and time. She appears well-developed and well-nourished.  HENT:  Right Ear: External ear normal. Tympanic membrane is not injected and not erythematous. A middle ear effusion is present.  Left Ear: External ear normal. Tympanic membrane is not injected and not erythematous.  No middle ear effusion.  Mouth/Throat: Uvula is midline. Normal dentition. Posterior oropharyngeal erythema present. No oropharyngeal exudate or posterior oropharyngeal edema.  Neck: Normal range of motion. Neck supple.  Cardiovascular: Normal rate.  Pulmonary/Chest: Effort normal and breath sounds normal.  Abdominal: Soft. Bowel sounds are normal. She exhibits no distension and no mass. There is tenderness. There is no guarding.  Lymphadenopathy:    She has no cervical adenopathy.  Neurological: She is alert and oriented to person, place, and time.  Skin: Skin is warm and dry.  Vitals reviewed.  Lab Results  Component Value Date   WBC 2.6 (L) 01/08/2018   HGB 9.5 (L) 01/08/2018   HCT 31.6 (L) 01/08/2018   PLT 222.0 Repeated and verified X2. 01/08/2018   GLUCOSE 86 10/31/2017   CHOL 186 10/31/2017   TRIG 37.0 10/31/2017   HDL 85.00 10/31/2017   LDLCALC 93 10/31/2017   ALT 10 10/31/2017   AST 15 10/31/2017   NA 135 10/31/2017   K 4.3 10/31/2017   CL 102 10/31/2017   CREATININE 0.56 10/31/2017   BUN 12 10/31/2017   CO2 26  10/31/2017   TSH 1.61 10/31/2017   HGBA1C 5.4 10/31/2017    Dg Chest 2 View  Result Date: 10/03/2017 CLINICAL DATA:  Dyspnea EXAM: CHEST - 2 VIEW COMPARISON:  02/03/2014 chest radiograph. FINDINGS: Stable cardiomediastinal silhouette with normal heart size. No pneumothorax. No pleural effusion. Lungs appear clear, with no acute consolidative airspace disease and no pulmonary edema. IMPRESSION: No active cardiopulmonary disease. Electronically Signed   By: Delbert Phenix M.D.   On: 10/03/2017 20:18    Assessment & Plan:   Kiara Hall was seen today for heartburn.  Diagnoses and all orders for this visit:  Epigastric pain -     gi cocktail (Maalox,Lidocaine,Donnatal) -     Cancel: Helicobacter pylori special antigen -     omeprazole (PRILOSEC) 20 MG capsule; Take 1 capsule (20 mg total) by mouth daily. -     ranitidine (ZANTAC) 150 MG tablet; Take 1 tablet (150 mg total) by mouth at bedtime. -     Helicobacter pylori special antigen; Future -     Helicobacter pylori special antigen  Fatigue, unspecified type -     CBC -     IBC panel -     Fecal occult blood, imunochemical  Iron deficiency anemia, unspecified iron deficiency anemia type -     CBC -     IBC panel -     Fecal occult blood, imunochemical  Gastroesophageal reflux disease with esophagitis -     gi cocktail (Maalox,Lidocaine,Donnatal) -     Cancel: Helicobacter pylori special antigen -     omeprazole (PRILOSEC) 20 MG capsule; Take 1 capsule (20 mg total) by mouth daily. -     ranitidine (ZANTAC) 150 MG tablet; Take 1 tablet (150 mg total) by mouth at bedtime.   I am having Kiara Hall start on omeprazole and ranitidine. I am also having her maintain her Prenatal Vit-Min-FA-Fish Oil ([employer]S PRENATAL GUMMY PO), Fe Fum-FePoly-Vit C-Vit B3 (INTEGRA PO), Mag Aspart-Potassium Aspart (POTASSIUM & MAGNESIUM ASPARTAT PO), Lorcaserin HCl ER, and senna. We administered gi cocktail.  Meds ordered this encounter  Medications  . gi  cocktail (Maalox,Lidocaine,Donnatal)  . omeprazole (PRILOSEC) 20 MG capsule    Sig: Take 1 capsule (20 mg total) by mouth daily.    Dispense:  30 capsule    Refill:  2    Order Specific Question:   Supervising Provider    Answer:   Dianne Dun [[id number]]  . ranitidine (ZANTAC) 150 MG tablet    Sig: Take 1 tablet (150 mg total) by mouth at bedtime.    Dispense:  30 tablet    Refill:  2    Order Specific Question:   Supervising Provider    Answer:   Dianne Dun [[id number]]    Follow-up: Return if symptoms worsen or fail to improve.  Kiara Penna, NP

## 2018-01-09 ENCOUNTER — Encounter: Payer: Self-pay | Admitting: Nurse Practitioner

## 2018-01-09 LAB — HELICOBACTER PYLORI  SPECIAL ANTIGEN
MICRO NUMBER:: 91075459
SPECIMEN QUALITY: ADEQUATE

## 2018-01-19 ENCOUNTER — Other Ambulatory Visit: Payer: Federal, State, Local not specified - PPO

## 2018-02-07 ENCOUNTER — Other Ambulatory Visit (INDEPENDENT_AMBULATORY_CARE_PROVIDER_SITE_OTHER): Payer: Federal, State, Local not specified - PPO

## 2018-02-07 ENCOUNTER — Telehealth: Payer: Self-pay

## 2018-02-07 DIAGNOSIS — R1013 Epigastric pain: Secondary | ICD-10-CM | POA: Diagnosis not present

## 2018-02-07 DIAGNOSIS — R195 Other fecal abnormalities: Secondary | ICD-10-CM

## 2018-02-07 LAB — FECAL OCCULT BLOOD, IMMUNOCHEMICAL: Fecal Occult Bld: POSITIVE — AB

## 2018-02-07 NOTE — Addendum Note (Signed)
Addended by: Arva Chafe on: 02/07/2018 03:35 PM   Modules accepted: Orders

## 2018-02-07 NOTE — Telephone Encounter (Signed)
Lab Tech was needing to find order for IFOB test in order to place results from testing. I found order put in on 01/08/2018 but order was placed incorrectly. New IFOB test was placed as a Future order so [facility] Tech could result the test.   Verbal Results Given over the phone: Positive IFOB test.

## 2018-02-27 DIAGNOSIS — R1012 Left upper quadrant pain: Secondary | ICD-10-CM | POA: Diagnosis not present

## 2018-02-27 DIAGNOSIS — R195 Other fecal abnormalities: Secondary | ICD-10-CM | POA: Diagnosis not present

## 2018-02-27 DIAGNOSIS — K219 Gastro-esophageal reflux disease without esophagitis: Secondary | ICD-10-CM | POA: Diagnosis not present

## 2018-03-23 ENCOUNTER — Encounter: Payer: Self-pay | Admitting: Family Medicine

## 2018-03-23 ENCOUNTER — Ambulatory Visit: Payer: Federal, State, Local not specified - PPO | Admitting: Family Medicine

## 2018-03-23 DIAGNOSIS — J069 Acute upper respiratory infection, unspecified: Secondary | ICD-10-CM | POA: Diagnosis not present

## 2018-03-23 MED ORDER — FLUCONAZOLE 150 MG PO TABS: 150.0000 mg | ORAL_TABLET | Freq: Once | ORAL | 0 refills | Status: AC

## 2018-03-23 MED ORDER — ALBUTEROL SULFATE HFA 108 (90 BASE) MCG/ACT IN AERS: 2.0000 | INHALATION_SPRAY | Freq: Four times a day (QID) | RESPIRATORY_TRACT | 0 refills | Status: DC | PRN

## 2018-03-23 MED ORDER — AZITHROMYCIN 250 MG PO TABS: ORAL_TABLET | ORAL | 0 refills | Status: DC

## 2018-03-23 NOTE — Progress Notes (Signed)
Kiara Hall - 37 y.o. female MRN [id number]  Date of birth: [birth year]  Subjective Chief Complaint  Patient presents with  . Cough  . Chills    HPI Kiara Hall is a 37 y.o. female who complains of congestion, sore throat, nasal blockage, post nasal drip, cough described as productive of yellow sputum, chills and shortness of breath/wheezing for 6 days. She has not really had improvement of her symptoms since initial onset.  She has been using theraflu with some relief.  She did use an expired albuterol inhaler as well which helped with her shortness of breath.  She denies a history of chest pain, fevers, myalgias, nausea and vomiting and denies a history of asthma. Patient does not smoke cigarettes.   ROS:  A comprehensive ROS was completed and negative except as noted per HPI   Allergies  Allergen Reactions  . Metronidazole Hives and Shortness Of Breath    Flagyl/rash    Past Medical History:  Diagnosis Date  . Anemia   . Anxiety   . Chicken pox   . GERD (gastroesophageal reflux disease)   . Palpitation   . Palpitation     Past Surgical History:  Procedure Laterality Date  . BREAST BIOPSY Left    benign   . CESAREAN SECTION N/A 02/09/2015   Procedure: CESAREAN SECTION;  Surgeon: Gerald Leitz, MD;  Location: [facility];  Service: Obstetrics;  Laterality: N/A;  . TONSILLECTOMY    . TONSILLECTOMY      Social History   Socioeconomic History  . Marital status: Single    Spouse name: Not on file  . Number of children: Not on file  . Years of education: Not on file  . Highest education level: Not on file  Occupational History  . Not on file  Social Needs  . Financial resource strain: Not on file  . Food insecurity:    Worry: Not on file    Inability: Not on file  . Transportation needs:    Medical: Not on file    Non-medical: Not on file  Tobacco Use  . Smoking status: Never Smoker  . Smokeless tobacco: Never Used  Substance and Sexual Activity  . Alcohol  use: Yes    Comment: very litle  . Drug use: No  . Sexual activity: Yes    Birth control/protection: Condom  Lifestyle  . Physical activity:    Days per week: Not on file    Minutes per session: Not on file  . Stress: Not on file  Relationships  . Social connections:    Talks on phone: Not on file    Gets together: Not on file    Attends religious service: Not on file    Active member of club or organization: Not on file    Attends meetings of clubs or organizations: Not on file    Relationship status: Not on file  Other Topics Concern  . Not on file  Social History Narrative  . Not on file    Family History  Problem Relation Age of Onset  . Diabetes Mother   . Hypertension Mother   . Heart disease Maternal Uncle 43  . Hypertension Maternal Grandfather   . Miscarriages / Stillbirths Sister   . Hypertension Maternal Aunt   . Stroke Maternal Grandmother     Health Maintenance  Topic Date Due  . INFLUENZA VACCINE  08/28/2018 (Originally 11/30/2017)  . PAP SMEAR  06/27/2020  . TETANUS/TDAP  06/30/2024  . HIV  Screening  Completed    ----------------------------------------------------------------------------------------------------------------------------------------------------------------------------------------------------------------- Physical Exam BP 120/80   Pulse (!) 101   Temp 98 F (36.7 C)   Wt 186 lb (84.4 kg)   SpO2 95%   BMI 35.14 kg/m   Physical Exam  Constitutional: She is oriented to person, place, and time. She appears well-nourished. No distress.  Hoarseness with speaking.   HENT:  Head: Normocephalic and atraumatic.  Mouth/Throat: Oropharynx is clear and moist.  Serous fluid posterior to TM b/l  Eyes: No scleral icterus.  Neck: Neck supple. No thyromegaly present.  Cardiovascular: Normal rate, regular rhythm and normal heart sounds.  Pulmonary/Chest: Effort normal and breath sounds normal.  Lymphadenopathy:    She has no cervical  adenopathy.  Neurological: She is alert and oriented to person, place, and time.  Skin: Skin is warm and dry. No rash noted.  Psychiatric: She has a normal mood and affect. Her behavior is normal.    ------------------------------------------------------------------------------------------------------------------------------------------------------------------------------------------------------------------- Assessment and Plan  URI (upper respiratory infection) -Symptoms consistent with URI however symptoms do not seem to be improving just yet.  Hopefully this will turn around and improve in the next couple of days.  Discussed continued supportive care for now, push fluids and rest. -Rx for azithromycin to start if not improving over the next 2-3 days.   -Renewal provided for albuterol.

## 2018-03-23 NOTE — Patient Instructions (Signed)
I hope you feel better soon but if symptoms are not improving over the next 2-3 days go ahead and start z-pack antibiotic.   Upper Respiratory Infection, Adult Most upper respiratory infections (URIs) are caused by a virus. A URI affects the nose, throat, and upper air passages. The most common type of URI is often called "the common cold." Follow these instructions at home:  Take medicines only as told by your doctor.  Gargle warm saltwater or take cough drops to comfort your throat as told by your doctor.  Use a warm mist humidifier or inhale steam from a shower to increase air moisture. This may make it easier to breathe.  Drink enough fluid to keep your pee (urine) clear or pale yellow.  Eat soups and other clear broths.  Have a healthy diet.  Rest as needed.  Go back to work when your fever is gone or your doctor says it is okay. ? You may need to stay home longer to avoid giving your URI to others. ? You can also wear a face mask and wash your hands often to prevent spread of the virus.  Use your inhaler more if you have asthma.  Do not use any tobacco products, including cigarettes, chewing tobacco, or electronic cigarettes. If you need help quitting, ask your doctor. Contact a doctor if:  You are getting worse, not better.  Your symptoms are not helped by medicine.  You have chills.  You are getting more short of breath.  You have brown or red mucus.  You have yellow or brown discharge from your nose.  You have pain in your face, especially when you bend forward.  You have a fever.  You have puffy (swollen) neck glands.  You have pain while swallowing.  You have white areas in the back of your throat. Get help right away if:  You have very bad or constant: ? Headache. ? Ear pain. ? Pain in your forehead, behind your eyes, and over your cheekbones (sinus pain). ? Chest pain.  You have long-lasting (chronic) lung disease and any of the  following: ? Wheezing. ? Long-lasting cough. ? Coughing up blood. ? A change in your usual mucus.  You have a stiff neck.  You have changes in your: ? Vision. ? Hearing. ? Thinking. ? Mood. This information is not intended to replace advice given to you by your health care provider. Make sure you discuss any questions you have with your health care provider. Document Released: 10/05/2007 Document Revised: 12/20/2015 Document Reviewed: 07/24/2013 Elsevier Interactive Patient Education  2018 [employer].   [employer] Vaporizer A cool mist vaporizer is a device that releases a cool mist into the air. If you have a cough or a cold, using a vaporizer may help relieve your symptoms. The mist adds moisture to the air, which may help thin your mucus and make it less sticky. When your mucus is thin and less sticky, it easier for you to breathe and to cough up secretions. Do not use a vaporizer if you are allergic to mold. Follow these instructions at home:  Follow the instructions that come with the vaporizer.  Do not use anything other than distilled water in the vaporizer.  Do not run the vaporizer all of the time. Doing that can cause mold or bacteria to grow in the vaporizer.  Clean the vaporizer after each time that you use it.  Clean and dry the vaporizer well before storing it.  Stop using  the vaporizer if your breathing symptoms get worse. This information is not intended to replace advice given to you by your health care provider. Make sure you discuss any questions you have with your health care provider. Document Released: 01/14/2004 Document Revised: 11/06/2015 Document Reviewed: 07/18/2015 Elsevier Interactive Patient Education  [employer]2018 Elsevier Inc.

## 2018-03-23 NOTE — Assessment & Plan Note (Addendum)
-  Symptoms consistent with URI however symptoms do not seem to be improving just yet.  Hopefully this will turn around and improve in the next couple of days.  Discussed continued supportive care for now, push fluids and rest. -Rx for azithromycin to start if not improving over the next 2-3 days.   -Renewal provided for albuterol.

## 2018-04-06 DIAGNOSIS — K080 Exfoliation of teeth due to systemic causes: Secondary | ICD-10-CM | POA: Diagnosis not present

## 2018-04-09 DIAGNOSIS — R1013 Epigastric pain: Secondary | ICD-10-CM | POA: Diagnosis not present

## 2018-04-09 DIAGNOSIS — D509 Iron deficiency anemia, unspecified: Secondary | ICD-10-CM | POA: Diagnosis not present

## 2018-04-10 LAB — CBC AND DIFFERENTIAL
HCT: 31 — AB (ref 36–46)
Hemoglobin: 9.1 — AB (ref 12.0–16.0)
Platelets: 320 (ref 150–399)
WBC: 5.4

## 2018-04-10 LAB — IRON,TIBC AND FERRITIN PANEL: Iron: 67

## 2018-04-11 ENCOUNTER — Encounter: Payer: Self-pay | Admitting: Nurse Practitioner

## 2018-04-11 LAB — CHG IRON BINDING TEST: Iron Saturation: 13

## 2018-04-11 LAB — IRON, TOTAL/TOTAL IRON BINDING CAP: Iron Bind.Cap.(Total): 525

## 2018-04-11 LAB — TRANSFERRIN: Transferrin: 375

## 2018-04-11 NOTE — Progress Notes (Signed)
Abstracted result and sent to scan  

## 2018-04-17 DIAGNOSIS — R1013 Epigastric pain: Secondary | ICD-10-CM | POA: Diagnosis not present

## 2018-04-17 DIAGNOSIS — R12 Heartburn: Secondary | ICD-10-CM | POA: Diagnosis not present

## 2018-04-17 DIAGNOSIS — R1012 Left upper quadrant pain: Secondary | ICD-10-CM | POA: Diagnosis not present

## 2018-04-17 DIAGNOSIS — K2930 Chronic superficial gastritis without bleeding: Secondary | ICD-10-CM | POA: Diagnosis not present

## 2018-04-17 DIAGNOSIS — D509 Iron deficiency anemia, unspecified: Secondary | ICD-10-CM | POA: Diagnosis not present

## 2018-04-18 ENCOUNTER — Telehealth: Payer: Self-pay | Admitting: Nurse Practitioner

## 2018-04-18 NOTE — Telephone Encounter (Signed)
Need OV

## 2018-04-18 NOTE — Telephone Encounter (Signed)
Copied from CRM [phone]. Topic: Quick Communication - Rx Refill/Question >> Apr 18, 2018 12:36 PM Maia Hall, Kiara S wrote: Medication: Lorcaserin HCl ER (BELVIQ XR) 20 MG TB24 - pt states medication is very expensive and she is requesting change to phentermine 37.5 MG capsule 1/day as a lower cost alternative. Please advise.  Has the patient contacted their pharmacy? Yes - cost prohibitive Preferred Pharmacy (with phone number or street name): [id number] - [place], [place] - [address] [phone] (Phone) [phone] (Fax)

## 2018-04-18 NOTE — Telephone Encounter (Signed)
Please advise, last ov for weight loss was 11/2017, no future appt made.

## 2018-04-18 NOTE — Telephone Encounter (Signed)
Pt has an appt with [facility] 04/19/18.

## 2018-04-19 ENCOUNTER — Telehealth: Payer: Self-pay

## 2018-04-19 ENCOUNTER — Ambulatory Visit: Payer: Federal, State, Local not specified - PPO | Admitting: Nurse Practitioner

## 2018-04-19 ENCOUNTER — Encounter: Payer: Self-pay | Admitting: Nurse Practitioner

## 2018-04-19 VITALS — BP 126/84 | HR 86 | Temp 98.8°F | Ht 61.0 in | Wt 190.8 lb

## 2018-04-19 DIAGNOSIS — E669 Obesity, unspecified: Secondary | ICD-10-CM | POA: Insufficient documentation

## 2018-04-19 DIAGNOSIS — K5900 Constipation, unspecified: Secondary | ICD-10-CM

## 2018-04-19 DIAGNOSIS — Z6834 Body mass index (BMI) 34.0-34.9, adult: Secondary | ICD-10-CM

## 2018-04-19 DIAGNOSIS — K5901 Slow transit constipation: Secondary | ICD-10-CM | POA: Insufficient documentation

## 2018-04-19 DIAGNOSIS — E66811 Obesity, class 1: Secondary | ICD-10-CM

## 2018-04-19 MED ORDER — PHENTERMINE HCL 15 MG PO CAPS
15.0000 mg | ORAL_CAPSULE | ORAL | 0 refills | Status: DC
Start: 2018-04-19 — End: 2018-05-11

## 2018-04-19 MED ORDER — LINACLOTIDE 72 MCG PO CAPS: 72.0000 ug | ORAL_CAPSULE | Freq: Every day | ORAL | 1 refills | Status: DC

## 2018-04-19 NOTE — Patient Instructions (Addendum)
Let me know if you want to try metformin and wellbutrin.  Ok to increase phentermine dose to 30mg  once a day if no adverse effects in 2weeks  Linaclotide oral capsules What is this medicine? LINACLOTIDE (lin a KLOE tide) is used to treat irritable bowel syndrome (IBS) with constipation as the main problem. It may also be used for relief of chronic constipation. This medicine may be used for other purposes; ask your health care provider or pharmacist if you have questions. COMMON BRAND NAME(S): Linzess What should I tell my health care provider before I take this medicine? They need to know if you have any of these conditions: -history of stool (fecal) impaction -now have diarrhea or have diarrhea often -other medical condition -stomach or intestinal disease, including bowel obstruction or abdominal adhesions -an unusual or allergic reaction to linaclotide, other medicines, foods, dyes, or preservatives -pregnant or trying to get pregnant -breast-feeding How should I use this medicine? Take this medicine by mouth with a glass of water. Follow the directions on the prescription label. Do not cut, crush or chew this medicine. Take on an empty stomach, at least 30 minutes before your first meal of the day. Take your medicine at regular intervals. Do not take your medicine more often than directed. Do not stop taking except on your doctor's advice. A special MedGuide will be given to you by the pharmacist with each prescription and refill. Be sure to read this information carefully each time. Talk to your pediatrician regarding the use of this medicine in children. This medicine is not approved for use in children. Overdosage: If you think you have taken too much of this medicine contact a poison control center or emergency room at once. NOTE: This medicine is only for you. Do not share this medicine with others. What if I miss a dose? If you miss a dose, just skip that dose. Wait until your next  dose, and take only that dose. Do not take double or extra doses. What may interact with this medicine? -certain medicines for bowel problems or bladder incontinence (these can cause constipation) This list may not describe all possible interactions. Give your health care provider a list of all the medicines, herbs, non-prescription drugs, or dietary supplements you use. Also tell them if you smoke, drink alcohol, or use illegal drugs. Some items may interact with your medicine. What should I watch for while using this medicine? Visit your doctor for regular check ups. Tell your doctor if your symptoms do not get better or if they get worse. Diarrhea is a common side effect of this medicine. It often begins within 2 weeks of starting this medicine. Stop taking this medicine and call your doctor if you get severe diarrhea. Stop taking this medicine and call your doctor or go to the nearest hospital emergency room right away if you develop unusual or severe stomach-area (abdominal) pain, especially if you also have bright red, bloody stools or black stools that look like tar. What side effects may I notice from receiving this medicine? Side effects that you should report to your doctor or health care professional as soon as possible: -allergic reactions like skin rash, itching or hives, swelling of the face, lips, or tongue -black, tarry stools -bloody or watery diarrhea -new or worsening stomach pain -severe or prolonged diarrhea Side effects that usually do not require medical attention (report to your doctor or health care professional if they continue or are bothersome): -bloating -gas -loose stools This list  may not describe all possible side effects. Call your doctor for medical advice about side effects. You may report side effects to FDA at 1-800-FDA-1088. Where should I keep my medicine? Keep out of the reach of children. Store at room temperature between 20 and 25 degrees C (68 and 77  degrees F). Keep this medicine in the original container. Keep tightly closed in a dry place. Do not remove the desiccant packet from the bottle, it helps to protect your medicine from moisture. Throw away any unused medicine after the expiration date. NOTE: This sheet is a summary. It may not cover all possible information. If you have questions about this medicine, talk to your doctor, pharmacist, or health care provider.  2019 Elsevier/Gold Standard (2015-05-21 12:17:04)

## 2018-04-19 NOTE — Telephone Encounter (Signed)
PA approved for Linzess. [facility] notified.   BIN  [id number] PCN   [id number] [id number] ID [id number]

## 2018-04-19 NOTE — Telephone Encounter (Signed)
Copied from CRM [phone]. Topic: General - Other >> Apr 19, 2018  2:00 PM Mcneil, Ja-Kwan wrote: Reason for CRM: Pt stated that the pharmacy asked that she reached out to the office to expedite the process as she was advise that a fax was sent regarding the Rx for linaclotide (LINZESS) 72 MCG capsule. Pt requests call back. Cb# [phone]

## 2018-04-19 NOTE — Progress Notes (Signed)
Subjective:  Patient ID: Kiara Hall, female    DOB: 02/25/1981  Age: 37 y.o. MRN: [id number]  CC: Weight Loss (weight loss med consult) and Constipation (constipation consult--stool softner helping as much. )   HPI Weight loss: Unable to afford Belviq Will like to resume phentermine. Continue exercise: boot camp 2-3x/week. Has difficulty maintaining diet due to increased stress.  Constipation: No improvement with sennakot, colace, change in diet and increase fluid intake. Need to use laxative in order to have BM, never feels empty, BM is pellets without use of laxative. [facility] Endoscopy done 04/17/2018, biopsy obtained and pending  Reviewed past Medical, Social and Family history today.  Outpatient Medications Prior to Visit  Medication Sig Dispense Refill  . omeprazole (PRILOSEC) 20 MG capsule Take 1 capsule (20 mg total) by mouth daily. 30 capsule 2  . senna (SENOKOT) 8.6 MG TABS tablet Take 1 tablet (8.6 mg total) by mouth at bedtime as needed for mild constipation. 30 each 3  . albuterol (PROVENTIL HFA;VENTOLIN HFA) 108 (90 Base) MCG/ACT inhaler Inhale 2 puffs into the lungs every 6 (six) hours as needed for wheezing or shortness of breath. (Patient not taking: Reported on 04/19/2018) 1 Inhaler 0  . azithromycin (ZITHROMAX) 250 MG tablet Take 2 tabs on day 1, followed by 1 tab on days 2-5 (Patient not taking: Reported on 04/19/2018) 6 tablet 0  . phentermine 37.5 MG capsule Take 37.5 mg by mouth every morning.     No facility-administered medications prior to visit.     ROS See HPI  Objective:  BP 126/84   Pulse 86   Temp 98.8 F (37.1 C) (Oral)   Ht 5\' 1"  (1.549 m)   Wt 190 lb 12.8 oz (86.5 kg)   SpO2 98%   BMI 36.05 kg/m   BP Readings from Last 3 Encounters:  04/19/18 126/84  03/23/18 120/80  01/08/18 128/86    Wt Readings from Last 3 Encounters:  04/19/18 190 lb 12.8 oz (86.5 kg)  03/23/18 186 lb (84.4 kg)    01/08/18 185 lb (83.9 kg)    Physical Exam Vitals signs reviewed.  Cardiovascular:     Rate and Rhythm: Normal rate and regular rhythm.  Abdominal:     General: Abdomen is flat. Bowel sounds are normal.     Palpations: Abdomen is soft. There is no mass.     Tenderness: There is no abdominal tenderness.  Neurological:     General: No focal deficit present.     Mental Status: She is oriented to person, place, and time.     Lab Results  Component Value Date   WBC 5.4 04/10/2018   HGB 9.1 (A) 04/10/2018   HCT 31 (A) 04/10/2018   PLT 320 04/10/2018   GLUCOSE 86 10/31/2017   CHOL 186 10/31/2017   TRIG 37.0 10/31/2017   HDL 85.00 10/31/2017   LDLCALC 93 10/31/2017   ALT 10 10/31/2017   AST 15 10/31/2017   NA 135 10/31/2017   K 4.3 10/31/2017   CL 102 10/31/2017   CREATININE 0.56 10/31/2017   BUN 12 10/31/2017   CO2 26 10/31/2017   TSH 1.61 10/31/2017   HGBA1C 5.4 10/31/2017    Dg Chest 2 View  Result Date: 10/03/2017 CLINICAL DATA:  Dyspnea EXAM: CHEST - 2 VIEW COMPARISON:  02/03/2014 chest radiograph. FINDINGS: Stable cardiomediastinal silhouette with normal heart size. No pneumothorax. No pleural effusion. Lungs appear clear, with no acute consolidative airspace  disease and no pulmonary edema. IMPRESSION: No active cardiopulmonary disease. Electronically Signed   By: Delbert PhenixJason A Poff M.D.   On: 10/03/2017 20:18    Assessment & Plan:   Kiara Hall was seen today for weight loss and constipation.  Diagnoses and all orders for this visit:  Class 1 obesity without serious comorbidity with body mass index (BMI) of 34.0 to 34.9 in adult, unspecified obesity type -     phentermine 15 MG capsule; Take 1 capsule (15 mg total) by mouth every morning.  Constipation, unspecified constipation type -     linaclotide (LINZESS) 72 MCG capsule; Take 1 capsule (72 mcg total) by mouth daily before breakfast.   I have discontinued Kiara Hall's phentermine, azithromycin, and albuterol. I  am also having her start on phentermine and linaclotide. Additionally, I am having her maintain her senna and omeprazole.  Meds ordered this encounter  Medications  . phentermine 15 MG capsule    Sig: Take 1 capsule (15 mg total) by mouth every morning.    Dispense:  30 capsule    Refill:  0    Order Specific Question:   Supervising Provider    Answer:   Dianne DunARON, TALIA M [[id number]]  . linaclotide (LINZESS) 72 MCG capsule    Sig: Take 1 capsule (72 mcg total) by mouth daily before breakfast.    Dispense:  30 capsule    Refill:  1    Order Specific Question:   Supervising Provider    Answer:   Dianne DunARON, TALIA M [[id number]]    Problem List Items Addressed This Visit      Other   Class 1 obesity without serious comorbidity with body mass index (BMI) of 34.0 to 34.9 in adult - Primary   Relevant Medications   phentermine 15 MG capsule   Constipation   Relevant Medications   linaclotide (LINZESS) 72 MCG capsule       Follow-up: Return in about 4 weeks (around 05/17/2018) for weight loss.  Alysia Pennaharlotte Bartley Vuolo, NP

## 2018-04-24 DIAGNOSIS — K2930 Chronic superficial gastritis without bleeding: Secondary | ICD-10-CM | POA: Diagnosis not present

## 2018-05-04 DIAGNOSIS — K080 Exfoliation of teeth due to systemic causes: Secondary | ICD-10-CM | POA: Diagnosis not present

## 2018-05-11 ENCOUNTER — Other Ambulatory Visit: Payer: Self-pay | Admitting: Nurse Practitioner

## 2018-05-11 DIAGNOSIS — Z6834 Body mass index (BMI) 34.0-34.9, adult: Principal | ICD-10-CM

## 2018-05-11 DIAGNOSIS — E669 Obesity, unspecified: Secondary | ICD-10-CM

## 2018-05-11 MED ORDER — PHENTERMINE HCL 15 MG PO CAPS: 30.0000 mg | ORAL_CAPSULE | ORAL | 0 refills | Status: DC

## 2018-05-11 NOTE — Telephone Encounter (Signed)
Will wait for upcoming appt before sending additional refills

## 2018-05-11 NOTE — Telephone Encounter (Signed)
Copied from CRM [id number]. Topic: Quick Communication - Rx Refill/Question >> May 11, 2018 11:28 AM Jilda Roche wrote: Medication: phentermine 15 MG capsule  Has the patient contacted their pharmacy? No. (Agent: If no, request that the patient contact the pharmacy for the refill.)    Patient states that when Dr Elease Etienne prescribed this that she told her to start out taking 1 a day then up it to 2 a day if she needed to and she has, so she needs a refill. (Agent: If yes, when and what did the pharmacy advise?)  Preferred Pharmacy (with phone number or street name): [id number] - [place], [place] - [address] [phone] (Phone) [phone] (Fax)    Agent: Please be advised that RX refills may take up to 3 business days. We ask that you follow-up with your pharmacy.

## 2018-05-11 NOTE — Telephone Encounter (Signed)
Charlotte please advise, PMP checked,pt last pick up this med 04/19/2018 and schedule to see you on 05/17/2018.

## 2018-05-17 ENCOUNTER — Ambulatory Visit: Payer: Federal, State, Local not specified - PPO | Admitting: Nurse Practitioner

## 2018-05-17 ENCOUNTER — Encounter: Payer: Self-pay | Admitting: Nurse Practitioner

## 2018-05-17 DIAGNOSIS — K5900 Constipation, unspecified: Secondary | ICD-10-CM

## 2018-05-17 DIAGNOSIS — Z6834 Body mass index (BMI) 34.0-34.9, adult: Secondary | ICD-10-CM

## 2018-05-17 DIAGNOSIS — E669 Obesity, unspecified: Secondary | ICD-10-CM

## 2018-05-17 DIAGNOSIS — E66811 Obesity, class 1: Secondary | ICD-10-CM

## 2018-05-17 MED ORDER — PHENTERMINE HCL 37.5 MG PO CAPS: 37.5000 mg | ORAL_CAPSULE | ORAL | 1 refills | Status: DC

## 2018-05-17 MED ORDER — LINACLOTIDE 290 MCG PO CAPS: 290.0000 ug | ORAL_CAPSULE | Freq: Every day | ORAL | 3 refills | Status: DC

## 2018-05-17 NOTE — Patient Instructions (Addendum)
Start phentermine 37.5mg  if no adverse effects with 30mg .

## 2018-05-17 NOTE — Progress Notes (Signed)
Subjective:  Patient ID: Kiara Hall, female    DOB: 03/15/1981  Age: 38 y.o. MRN: [id number]  CC: Follow-up (4 wk f/u wt loss, doing boot camp, no weight change but she said inches have came off)   HPI Weight loss Management: Current BMI 36. No weight loss noted with phentermine 30mg . Denies any adverse effects with medication. Continues boot camp 2x/week Maintains small portions, low fat and low carb.  Constipation: Did not improve with lower dose of linzess. Tried  Higher dose from a friend and it was effective.  Reviewed past Medical, Social and Family history today.  Outpatient Medications Prior to Visit  Medication Sig Dispense Refill  . senna (SENOKOT) 8.6 MG TABS tablet Take 1 tablet (8.6 mg total) by mouth at bedtime as needed for mild constipation. 30 each 3  . phentermine 15 MG capsule Take 2 capsules (30 mg total) by mouth every morning. 30 capsule 0  . omeprazole (PRILOSEC) 20 MG capsule Take 1 capsule (20 mg total) by mouth daily. (Patient not taking: Reported on 05/17/2018) 30 capsule 2  . linaclotide (LINZESS) 72 MCG capsule Take 1 capsule (72 mcg total) by mouth daily before breakfast. (Patient not taking: Reported on 05/17/2018) 30 capsule 1   No facility-administered medications prior to visit.     ROS See HPI  Objective:  BP 122/72   Pulse 83   Temp 98.6 F (37 C) (Oral)   Ht 5\' 1"  (1.549 m)   Wt 191 lb 6.4 oz (86.8 kg)   SpO2 95%   BMI 36.16 kg/m   BP Readings from Last 3 Encounters:  05/17/18 122/72  04/19/18 126/84  03/23/18 120/80    Wt Readings from Last 3 Encounters:  05/17/18 191 lb 6.4 oz (86.8 kg)  04/19/18 190 lb 12.8 oz (86.5 kg)  03/23/18 186 lb (84.4 kg)    Physical Exam Cardiovascular:     Rate and Rhythm: Normal rate and regular rhythm.     Pulses: Normal pulses.     Heart sounds: Normal heart sounds.  Musculoskeletal:     Right lower leg: No edema.     Left lower leg: No edema.  Neurological:     Mental Status: She  is alert and oriented to person, place, and time.  Psychiatric:        Mood and Affect: Mood normal.        Behavior: Behavior normal.        Thought Content: Thought content normal.     Lab Results  Component Value Date   WBC 5.4 04/10/2018   HGB 9.1 (A) 04/10/2018   HCT 31 (A) 04/10/2018   PLT 320 04/10/2018   GLUCOSE 86 10/31/2017   CHOL 186 10/31/2017   TRIG 37.0 10/31/2017   HDL 85.00 10/31/2017   LDLCALC 93 10/31/2017   ALT 10 10/31/2017   AST 15 10/31/2017   NA 135 10/31/2017   K 4.3 10/31/2017   CL 102 10/31/2017   CREATININE 0.56 10/31/2017   BUN 12 10/31/2017   CO2 26 10/31/2017   TSH 1.61 10/31/2017   HGBA1C 5.4 10/31/2017    Dg Chest 2 View  Result Date: 10/03/2017 CLINICAL DATA:  Dyspnea EXAM: CHEST - 2 VIEW COMPARISON:  02/03/2014 chest radiograph. FINDINGS: Stable cardiomediastinal silhouette with normal heart size. No pneumothorax. No pleural effusion. Lungs appear clear, with no acute consolidative airspace disease and no pulmonary edema. IMPRESSION: No active cardiopulmonary disease. Electronically Signed   By: Jannifer RodneyJason A Poff M.D.  On: 10/03/2017 20:18    Assessment & Plan:   Kiara MilletMegan was seen today for follow-up.  Diagnoses and all orders for this visit:  Constipation, unspecified constipation type -     linaclotide (LINZESS) 290 MCG CAPS capsule; Take 1 capsule (290 mcg total) by mouth daily before breakfast.  Class 1 obesity without serious comorbidity with body mass index (BMI) of 34.0 to 34.9 in adult, unspecified obesity type -     phentermine 37.5 MG capsule; Take 1 capsule (37.5 mg total) by mouth every morning.   I have changed Kiara Hall's linaclotide and phentermine. I am also having her maintain her senna and omeprazole.  Meds ordered this encounter  Medications  . linaclotide (LINZESS) 290 MCG CAPS capsule    Sig: Take 1 capsule (290 mcg total) by mouth daily before breakfast.    Dispense:  30 capsule    Refill:  3    Order  Specific Question:   Supervising Provider    Answer:   MATTHEWS, CODY [[id number]]  . phentermine 37.5 MG capsule    Sig: Take 1 capsule (37.5 mg total) by mouth every morning.    Dispense:  30 capsule    Refill:  1    Order Specific Question:   Supervising Provider    Answer:   MATTHEWS, CODY [[id number]]    Problem List Items Addressed This Visit      Other   Class 1 obesity without serious comorbidity with body mass index (BMI) of 34.0 to 34.9 in adult   Relevant Medications   phentermine 37.5 MG capsule   Constipation   Relevant Medications   linaclotide (LINZESS) 290 MCG CAPS capsule       Follow-up: Return in about 2 months (around 07/16/2018) for weight management.Kiara Hall.  Kiara Willits, NP

## 2018-05-22 ENCOUNTER — Encounter: Payer: Self-pay | Admitting: Nurse Practitioner

## 2018-05-28 ENCOUNTER — Telehealth: Payer: Self-pay | Admitting: Nurse Practitioner

## 2018-05-28 MED ORDER — LINACLOTIDE 145 MCG PO CAPS: 290.0000 ug | ORAL_CAPSULE | Freq: Every day | ORAL | 3 refills | Status: DC

## 2018-05-28 NOTE — Telephone Encounter (Signed)
PA for Linzess 290 mcg denied.   Per Kiara Hall, send in Linzess 145 mcg 2 cap once daily. Pt is aware of this.

## 2018-06-07 NOTE — Telephone Encounter (Signed)
Call the pt, advise her she can pay out of pocket or she can try Amitiza instead. Pt will call back.

## 2018-06-14 ENCOUNTER — Encounter (INDEPENDENT_AMBULATORY_CARE_PROVIDER_SITE_OTHER): Payer: Self-pay

## 2018-06-26 ENCOUNTER — Ambulatory Visit (INDEPENDENT_AMBULATORY_CARE_PROVIDER_SITE_OTHER): Payer: Self-pay | Admitting: Family Medicine

## 2018-06-26 ENCOUNTER — Encounter (INDEPENDENT_AMBULATORY_CARE_PROVIDER_SITE_OTHER): Payer: Self-pay

## 2018-07-02 DIAGNOSIS — O09521 Supervision of elderly multigravida, first trimester: Secondary | ICD-10-CM | POA: Diagnosis not present

## 2018-07-02 DIAGNOSIS — Z3481 Encounter for supervision of other normal pregnancy, first trimester: Secondary | ICD-10-CM | POA: Diagnosis not present

## 2018-07-02 DIAGNOSIS — Z3490 Encounter for supervision of normal pregnancy, unspecified, unspecified trimester: Secondary | ICD-10-CM | POA: Diagnosis not present

## 2018-07-02 DIAGNOSIS — Z3201 Encounter for pregnancy test, result positive: Secondary | ICD-10-CM | POA: Diagnosis not present

## 2018-07-02 LAB — OB RESULTS CONSOLE ABO/RH: RH Type: POSITIVE

## 2018-07-02 LAB — OB RESULTS CONSOLE GC/CHLAMYDIA
Chlamydia: NEGATIVE
Gonorrhea: NEGATIVE

## 2018-07-02 LAB — OB RESULTS CONSOLE HEPATITIS B SURFACE ANTIGEN: Hepatitis B Surface Ag: NEGATIVE

## 2018-07-02 LAB — OB RESULTS CONSOLE HIV ANTIBODY (ROUTINE TESTING): HIV: NONREACTIVE

## 2018-07-02 LAB — OB RESULTS CONSOLE RPR: RPR: NONREACTIVE

## 2018-07-02 LAB — OB RESULTS CONSOLE RUBELLA ANTIBODY, IGM: Rubella: IMMUNE

## 2018-07-02 LAB — OB RESULTS CONSOLE ANTIBODY SCREEN: Antibody Screen: NEGATIVE

## 2018-07-09 ENCOUNTER — Ambulatory Visit (INDEPENDENT_AMBULATORY_CARE_PROVIDER_SITE_OTHER): Payer: Self-pay | Admitting: Family Medicine

## 2018-07-19 ENCOUNTER — Ambulatory Visit: Payer: Federal, State, Local not specified - PPO | Admitting: Nurse Practitioner

## 2018-07-23 DIAGNOSIS — Z3490 Encounter for supervision of normal pregnancy, unspecified, unspecified trimester: Secondary | ICD-10-CM | POA: Diagnosis not present

## 2018-07-23 DIAGNOSIS — Z3481 Encounter for supervision of other normal pregnancy, first trimester: Secondary | ICD-10-CM | POA: Diagnosis not present

## 2018-07-24 DIAGNOSIS — O09521 Supervision of elderly multigravida, first trimester: Secondary | ICD-10-CM | POA: Diagnosis not present

## 2018-07-31 DIAGNOSIS — O09521 Supervision of elderly multigravida, first trimester: Secondary | ICD-10-CM | POA: Diagnosis not present

## 2018-09-17 DIAGNOSIS — Z36 Encounter for antenatal screening for chromosomal anomalies: Secondary | ICD-10-CM | POA: Diagnosis not present

## 2018-09-17 DIAGNOSIS — O09522 Supervision of elderly multigravida, second trimester: Secondary | ICD-10-CM | POA: Diagnosis not present

## 2018-09-17 DIAGNOSIS — Z3482 Encounter for supervision of other normal pregnancy, second trimester: Secondary | ICD-10-CM | POA: Diagnosis not present

## 2018-10-11 DIAGNOSIS — Z7189 Other specified counseling: Secondary | ICD-10-CM | POA: Diagnosis not present

## 2018-10-11 DIAGNOSIS — Z1159 Encounter for screening for other viral diseases: Secondary | ICD-10-CM | POA: Diagnosis not present

## 2018-10-11 DIAGNOSIS — Z20828 Contact with and (suspected) exposure to other viral communicable diseases: Secondary | ICD-10-CM | POA: Diagnosis not present

## 2018-10-15 DIAGNOSIS — N898 Other specified noninflammatory disorders of vagina: Secondary | ICD-10-CM | POA: Diagnosis not present

## 2018-11-05 DIAGNOSIS — Z3482 Encounter for supervision of other normal pregnancy, second trimester: Secondary | ICD-10-CM | POA: Diagnosis not present

## 2018-11-05 DIAGNOSIS — Z3490 Encounter for supervision of normal pregnancy, unspecified, unspecified trimester: Secondary | ICD-10-CM | POA: Diagnosis not present

## 2018-12-03 DIAGNOSIS — O09523 Supervision of elderly multigravida, third trimester: Secondary | ICD-10-CM | POA: Diagnosis not present

## 2018-12-03 DIAGNOSIS — O99213 Obesity complicating pregnancy, third trimester: Secondary | ICD-10-CM | POA: Diagnosis not present

## 2018-12-17 DIAGNOSIS — Z23 Encounter for immunization: Secondary | ICD-10-CM | POA: Diagnosis not present

## 2018-12-31 DIAGNOSIS — O26843 Uterine size-date discrepancy, third trimester: Secondary | ICD-10-CM | POA: Diagnosis not present

## 2018-12-31 DIAGNOSIS — O99213 Obesity complicating pregnancy, third trimester: Secondary | ICD-10-CM | POA: Diagnosis not present

## 2018-12-31 DIAGNOSIS — O09523 Supervision of elderly multigravida, third trimester: Secondary | ICD-10-CM | POA: Diagnosis not present

## 2019-01-14 DIAGNOSIS — Z3490 Encounter for supervision of normal pregnancy, unspecified, unspecified trimester: Secondary | ICD-10-CM | POA: Diagnosis not present

## 2019-01-21 ENCOUNTER — Encounter (HOSPITAL_COMMUNITY): Payer: Self-pay | Admitting: Medical

## 2019-01-21 ENCOUNTER — Inpatient Hospital Stay (HOSPITAL_COMMUNITY)
Admission: AD | Admit: 2019-01-21 | Discharge: 2019-01-21 | Disposition: A | Discharge status: Home / Self Care | Payer: Federal, State, Local not specified - PPO | Attending: Obstetrics and Gynecology | Admitting: Obstetrics and Gynecology

## 2019-01-21 ENCOUNTER — Other Ambulatory Visit: Payer: Self-pay

## 2019-01-21 DIAGNOSIS — Z3A36 36 weeks gestation of pregnancy: Secondary | ICD-10-CM | POA: Insufficient documentation

## 2019-01-21 DIAGNOSIS — O26893 Other specified pregnancy related conditions, third trimester: Secondary | ICD-10-CM

## 2019-01-21 DIAGNOSIS — R102 Pelvic and perineal pain: Secondary | ICD-10-CM | POA: Diagnosis not present

## 2019-01-21 DIAGNOSIS — Z3689 Encounter for other specified antenatal screening: Secondary | ICD-10-CM

## 2019-01-21 DIAGNOSIS — O09523 Supervision of elderly multigravida, third trimester: Secondary | ICD-10-CM | POA: Diagnosis not present

## 2019-01-21 DIAGNOSIS — O26843 Uterine size-date discrepancy, third trimester: Secondary | ICD-10-CM | POA: Diagnosis not present

## 2019-01-21 LAB — URINALYSIS, ROUTINE W REFLEX MICROSCOPIC
Bilirubin Urine: NEGATIVE
Glucose, UA: NEGATIVE mg/dL
Hgb urine dipstick: NEGATIVE
Ketones, ur: NEGATIVE mg/dL
Leukocytes,Ua: NEGATIVE
Nitrite: NEGATIVE
Protein, ur: NEGATIVE mg/dL
Specific Gravity, Urine: 1.004 — ABNORMAL LOW (ref 1.005–1.030)
pH: 7 (ref 5.0–8.0)

## 2019-01-21 NOTE — Discharge Instructions (Signed)

## 2019-01-21 NOTE — MAU Provider Note (Signed)
S: Ms. Kiara Hall is a 38 y.o. G2P1001 at [redacted]w[redacted]d  who presents to MAU today from clinic complaining of pelvic pain which she rates as 3-10 upon arrival in MAU. She denies contraction pain to CNM upon initial assessment. She denies vaginal bleeding. She denies LOF. She reports normal fetal movement.    O: BP 123/66 (BP Location: Left Arm)   Pulse 99   Temp 98.3 F (36.8 C) (Oral)   Resp 16   Ht 5\' 1"  (1.549 m)   Wt 110.7 kg   LMP 05/08/2018   SpO2 96%   BMI 46.12 kg/m  GENERAL: Well-developed, well-nourished female in no acute distress.  HEAD: Normocephalic, atraumatic.  CHEST: Normal effort of breathing, regular heart rate ABDOMEN: Soft, nontender, gravid  Cervical exam:  Dilation: Fingertip Effacement (%): Thick Cervical Position: Posterior Station: Ballotable Presentation: Undeterminable Exam by:: jaton burgess rnc   Fetal Monitoring: Baseline: 150 Variability: Moderate Accelerations: Positive  Decelerations: None Contractions: Irregular   A: SIUP at [redacted]w[redacted]d  Reactive tracing Cervix FT/thick Dr. Landry Mellow called to notify MAU staff of impending arrival. Per Dr. Landry Mellow, patient had 8/8 BPP in clinic this morning Per Pt, discomfort improving with repositioning and PO fluid intake  P: Discharge home in stable condition  Mallie Snooks, CNM 01/21/2019 2:18 PM

## 2019-01-21 NOTE — MAU Note (Signed)
Pt was sent over from MD office after ROB appt for extended monitoring.  Pt states she has been having mild UCs "every day".  SVE closed while in office today.  Denies LOF or VB.

## 2019-01-21 NOTE — Patient Instructions (Signed)
Kiara Hall  01/21/2019   Your procedure is scheduled on:  02/05/2019  Arrive at [facility] at Entrance C on [address] at [facility]  and [address]. You are invited to use the FREE valet parking or use the Visitor's parking deck.  Pick up the phone at the desk and dial [phone].  Call this number if you have problems the morning of surgery: [phone]  Remember:   Do not eat food:(After Midnight) Desps de medianoche.  Do not drink clear liquids: (After Midnight) Desps de medianoche.  Take these medicines the morning of surgery with A SIP OF WATER:    Do not wear jewelry, make-up or nail polish.  Do not wear lotions, powders, or perfumes. Do not wear deodorant.  Do not shave 48 hours prior to surgery.  Do not bring valuables to the hospital.  [facility] is not   responsible for any belongings or valuables brought to the hospital.  Contacts, dentures or bridgework may not be worn into surgery.  Leave suitcase in the car. After surgery it may be brought to your room.  For patients admitted to the hospital, checkout time is 11:00 AM the day of              discharge.      Please read over the following fact sheets that you were given:     Preparing for Surgery

## 2019-02-03 ENCOUNTER — Other Ambulatory Visit: Payer: Self-pay

## 2019-02-03 ENCOUNTER — Other Ambulatory Visit (HOSPITAL_COMMUNITY)
Admission: RE | Admit: 2019-02-03 | Discharge: 2019-02-03 | Disposition: A | Discharge status: Home / Self Care | Payer: Federal, State, Local not specified - PPO | Source: Ambulatory Visit | Attending: Obstetrics and Gynecology | Admitting: Obstetrics and Gynecology

## 2019-02-03 ENCOUNTER — Other Ambulatory Visit: Payer: Self-pay | Admitting: Obstetrics and Gynecology

## 2019-02-03 DIAGNOSIS — Z20828 Contact with and (suspected) exposure to other viral communicable diseases: Secondary | ICD-10-CM | POA: Insufficient documentation

## 2019-02-03 DIAGNOSIS — Z01812 Encounter for preprocedural laboratory examination: Secondary | ICD-10-CM | POA: Diagnosis not present

## 2019-02-03 DIAGNOSIS — O34219 Maternal care for unspecified type scar from previous cesarean delivery: Secondary | ICD-10-CM | POA: Insufficient documentation

## 2019-02-03 LAB — TYPE AND SCREEN
ABO/RH(D): O POS
Antibody Screen: NEGATIVE

## 2019-02-03 LAB — CBC
HCT: 36 % (ref 36.0–46.0)
Hemoglobin: 11.7 g/dL — ABNORMAL LOW (ref 12.0–15.0)
MCH: 26 pg (ref 26.0–34.0)
MCHC: 32.5 g/dL (ref 30.0–36.0)
MCV: 80 fL (ref 80.0–100.0)
Platelets: 143 10*3/uL — ABNORMAL LOW (ref 150–400)
RBC: 4.5 MIL/uL (ref 3.87–5.11)
RDW: 18.6 % — ABNORMAL HIGH (ref 11.5–15.5)
WBC: 4.7 10*3/uL (ref 4.0–10.5)
nRBC: 0 % (ref 0.0–0.2)

## 2019-02-03 LAB — ABO/RH: ABO/RH(D): O POS

## 2019-02-03 LAB — SARS CORONAVIRUS 2 (TAT 6-24 HRS): SARS Coronavirus 2: NEGATIVE

## 2019-02-03 LAB — RPR: RPR Ser Ql: NONREACTIVE

## 2019-02-03 NOTE — MAU Note (Signed)
Pt here for PAT covid swab and lab draw. Pt denies symptoms. Swab collected. Informed pt to not removed blood bank bracelet, verbalizes understanding.

## 2019-02-04 ENCOUNTER — Other Ambulatory Visit: Payer: Self-pay | Admitting: Obstetrics and Gynecology

## 2019-02-04 NOTE — H&P (Signed)
Kiara Hall is a 38 y.o. female G2P1001 at 39 wks and 0 daygs presenting for repeat cesarean section. Pregnancy complicated by h/o cesarean section and obesity . Prenatal care provided by Dr. Gerald Leitz. With [facility] .    OB History    Gravida  2   Para  1   Term  1   Preterm      AB      Living  1     SAB      TAB      Ectopic      Multiple  0   Live Births  1          Past Medical History:  Diagnosis Date  . Anemia   . Anxiety   . Chicken pox   . GERD (gastroesophageal reflux disease)   . Palpitation   . Palpitation    Past Surgical History:  Procedure Laterality Date  . BREAST BIOPSY Left    benign   . CESAREAN SECTION N/A 02/09/2015   Procedure: CESAREAN SECTION;  Surgeon: Gerald Leitz, MD;  Location: [facility];  Service: Obstetrics;  Laterality: N/A;  . TONSILLECTOMY    . TONSILLECTOMY     Family History: family history includes Diabetes in her mother; Heart disease (age of onset: 66) in her maternal uncle; Hypertension in her maternal aunt, maternal grandfather, and mother; Miscarriages / [place] in her sister; Stroke in her maternal grandmother. Social History:  reports that she has never smoked. She has never used smokeless tobacco. She reports current alcohol use. She reports that she does not use drugs.     Maternal Diabetes: No Genetic Screening: Normal Maternal Ultrasounds/Referrals: Normal Fetal Ultrasounds or other Referrals:  None Maternal Substance Abuse:  No Significant Maternal Medications:  None Significant Maternal Lab Results:  Group B Strep negative Other Comments:  None  Review of Systems  Constitutional: Negative.   HENT: Negative.   Eyes: Negative.   Respiratory: Negative.   Cardiovascular: Negative.   Gastrointestinal: Negative.   Genitourinary: Negative.   Musculoskeletal: Negative.   Skin: Negative.   Neurological: Negative.   Endo/Heme/Allergies: Negative.   Psychiatric/Behavioral: Negative.    History    Last menstrual period 05/08/2018, unknown if currently breastfeeding. Exam Physical Exam  Vitals reviewed. Constitutional: She is oriented to person, place, and time. She appears well-developed and well-nourished.  HENT:  Head: Normocephalic and atraumatic.  Eyes: Pupils are equal, round, and reactive to light. Conjunctivae are normal.  Neck: Normal range of motion. Neck supple.  Cardiovascular: Normal rate and regular rhythm.  Respiratory: Effort normal and breath sounds normal.  GI: There is no abdominal tenderness.  Genitourinary:    Vagina normal.   Musculoskeletal: Normal range of motion.        General: Edema present.  Neurological: She is alert and oriented to person, place, and time. She has normal reflexes.  Skin: Skin is warm and dry.  Psychiatric: She has a normal mood and affect.    Prenatal labs: ABO, Rh: --/--/O POS, O POS Performed at [facility], [address] [place], [place] [address]  [phone]) Antibody: NEG (10/04 0905) Rubella: Immune (03/02 0000) RPR: NON REACTIVE (10/04 0905)  HBsAg: Negative (03/02 0000)  HIV: Non-reactive (03/02 0000)  GBS:   Negative   Assessment/Plan: 39 wks and 0 days with h/o cesarean section. Pt desires repeat cesarean section r/b/a of cesarean section discussed including but not limited to infection bleeding damage to bowel bladder baby  and surrounding organs with the need for further surgey. R/o transfusion discussed. Pt voiced understanding and desires to proceed with repeat cesarean section.   [name] 02/04/2019, 10:48 PM

## 2019-02-04 NOTE — Anesthesia Preprocedure Evaluation (Addendum)
Anesthesia Evaluation  Patient identified by MRN, date of birth, ID band Patient awake    Reviewed: Allergy & Precautions, NPO status , Patient's Chart, lab work & pertinent test results  History of Anesthesia Complications Negative for: history of anesthetic complications  Airway Mallampati: III  TM Distance: >3 FB Neck ROM: Full    Dental no notable dental hx. (+) Dental Advisory Given   Pulmonary neg pulmonary ROS,    Pulmonary exam normal        Cardiovascular negative cardio ROS Normal cardiovascular exam     Neuro/Psych  Headaches, PSYCHIATRIC DISORDERS Anxiety Depression    GI/Hepatic Neg liver ROS, GERD  ,  Endo/Other  Morbid obesity  Renal/GU negative Renal ROS     Musculoskeletal negative musculoskeletal ROS (+)   Abdominal   Peds  Hematology negative hematology ROS (+)   Anesthesia Other Findings Day of surgery medications reviewed with the patient.  Reproductive/Obstetrics (+) Pregnancy                            Anesthesia Physical Anesthesia Plan  ASA: III  Anesthesia Plan: Spinal   Post-op Pain Management:    Induction:   PONV Risk Score and Plan: 2 and Ondansetron and Scopolamine patch - Pre-op  Airway Management Planned: Natural Airway  Additional Equipment:   Intra-op Plan:   Post-operative Plan:   Informed Consent: I have reviewed the patients History and Physical, chart, labs and discussed the procedure including the risks, benefits and alternatives for the proposed anesthesia with the patient or authorized representative who has indicated his/her understanding and acceptance.     Dental advisory given  Plan Discussed with: Anesthesiologist and CRNA  Anesthesia Plan Comments:        Anesthesia Quick Evaluation

## 2019-02-05 ENCOUNTER — Inpatient Hospital Stay (HOSPITAL_COMMUNITY): Payer: Federal, State, Local not specified - PPO | Admitting: Anesthesiology

## 2019-02-05 ENCOUNTER — Other Ambulatory Visit: Payer: Self-pay

## 2019-02-05 ENCOUNTER — Encounter (HOSPITAL_COMMUNITY)
Admission: RE | Disposition: A | Discharge status: Home / Self Care | Payer: Self-pay | Source: Home / Self Care | Attending: Obstetrics and Gynecology

## 2019-02-05 ENCOUNTER — Encounter (HOSPITAL_COMMUNITY): Payer: Self-pay

## 2019-02-05 ENCOUNTER — Inpatient Hospital Stay (HOSPITAL_COMMUNITY)
Admission: RE | Admit: 2019-02-05 | Discharge: 2019-02-08 | DRG: 788 | Disposition: A | Discharge status: Home / Self Care | Payer: Federal, State, Local not specified - PPO | Attending: Obstetrics and Gynecology | Admitting: Obstetrics and Gynecology

## 2019-02-05 DIAGNOSIS — O34211 Maternal care for low transverse scar from previous cesarean delivery: Principal | ICD-10-CM | POA: Diagnosis present

## 2019-02-05 DIAGNOSIS — O99214 Obesity complicating childbirth: Secondary | ICD-10-CM | POA: Diagnosis not present

## 2019-02-05 DIAGNOSIS — O321XX0 Maternal care for breech presentation, not applicable or unspecified: Secondary | ICD-10-CM | POA: Diagnosis not present

## 2019-02-05 DIAGNOSIS — Z98891 History of uterine scar from previous surgery: Secondary | ICD-10-CM | POA: Diagnosis not present

## 2019-02-05 DIAGNOSIS — O9902 Anemia complicating childbirth: Secondary | ICD-10-CM | POA: Diagnosis not present

## 2019-02-05 DIAGNOSIS — Z3A39 39 weeks gestation of pregnancy: Secondary | ICD-10-CM | POA: Diagnosis not present

## 2019-02-05 DIAGNOSIS — D649 Anemia, unspecified: Secondary | ICD-10-CM | POA: Diagnosis not present

## 2019-02-05 DIAGNOSIS — O34219 Maternal care for unspecified type scar from previous cesarean delivery: Secondary | ICD-10-CM | POA: Diagnosis not present

## 2019-02-05 SURGERY — Surgical Case
Anesthesia: Spinal | Site: Abdomen | Wound class: Clean Contaminated

## 2019-02-05 MED ORDER — SODIUM CHLORIDE 0.9 % IV SOLN
INTRAVENOUS | Status: DC | PRN
  Administered 2019-02-05: 60 ug/min via INTRAVENOUS

## 2019-02-05 MED ORDER — STERILE WATER FOR IRRIGATION IR SOLN
Status: DC | PRN
  Administered 2019-02-05: 1

## 2019-02-05 MED ORDER — ENOXAPARIN SODIUM 60 MG/0.6ML ~~LOC~~ SOLN
55.0000 mg | SUBCUTANEOUS | Status: DC
  Administered 2019-02-06 – 2019-02-08 (×3): 55 mg via SUBCUTANEOUS
  Filled 2019-02-05 (×3): qty 0.6

## 2019-02-05 MED ORDER — FENTANYL CITRATE (PF) 100 MCG/2ML IJ SOLN
INTRAMUSCULAR | Status: AC
  Filled 2019-02-05: qty 2

## 2019-02-05 MED ORDER — CEFAZOLIN SODIUM-DEXTROSE 2-3 GM-%(50ML) IV SOLR
INTRAVENOUS | Status: DC | PRN
  Administered 2019-02-05: 2 g via INTRAVENOUS

## 2019-02-05 MED ORDER — ONDANSETRON HCL 4 MG/2ML IJ SOLN: 4.0000 mg | Freq: Three times a day (TID) | INTRAMUSCULAR | Status: DC | PRN

## 2019-02-05 MED ORDER — BUPIVACAINE IN DEXTROSE 0.75-8.25 % IT SOLN
INTRATHECAL | Status: DC | PRN
  Administered 2019-02-05: 1.4 mL via INTRATHECAL

## 2019-02-05 MED ORDER — PHENYLEPHRINE HCL-NACL 20-0.9 MG/250ML-% IV SOLN
INTRAVENOUS | Status: AC
  Filled 2019-02-05: qty 250

## 2019-02-05 MED ORDER — NALOXONE HCL 0.4 MG/ML IJ SOLN: 0.4000 mg | INTRAMUSCULAR | Status: DC | PRN

## 2019-02-05 MED ORDER — NALBUPHINE HCL 10 MG/ML IJ SOLN
5.0000 mg | INTRAMUSCULAR | Status: DC | PRN
  Filled 2019-02-05: qty 0.5

## 2019-02-05 MED ORDER — MEPERIDINE HCL 25 MG/ML IJ SOLN
INTRAMUSCULAR | Status: DC | PRN
  Administered 2019-02-05: 12.5 mg via INTRAVENOUS

## 2019-02-05 MED ORDER — MORPHINE SULFATE (PF) 0.5 MG/ML IJ SOLN
INTRAMUSCULAR | Status: AC
  Filled 2019-02-05: qty 10

## 2019-02-05 MED ORDER — SODIUM CHLORIDE 0.9 % IR SOLN
Status: DC | PRN
  Administered 2019-02-05: 1

## 2019-02-05 MED ORDER — NALBUPHINE HCL 10 MG/ML IJ SOLN
5.0000 mg | Freq: Once | INTRAMUSCULAR | Status: DC | PRN
  Filled 2019-02-05: qty 0.5

## 2019-02-05 MED ORDER — FERROUS SULFATE 325 (65 FE) MG PO TABS
325.0000 mg | ORAL_TABLET | Freq: Two times a day (BID) | ORAL | Status: DC
  Administered 2019-02-05 – 2019-02-08 (×6): 325 mg via ORAL
  Filled 2019-02-05 (×6): qty 1

## 2019-02-05 MED ORDER — DIBUCAINE (PERIANAL) 1 % EX OINT: 1.0000 "application " | TOPICAL_OINTMENT | CUTANEOUS | Status: DC | PRN

## 2019-02-05 MED ORDER — LACTATED RINGERS IV SOLN
INTRAVENOUS | Status: DC
  Administered 2019-02-05 (×2): via INTRAVENOUS

## 2019-02-05 MED ORDER — PHENYLEPHRINE 40 MCG/ML (10ML) SYRINGE FOR IV PUSH (FOR BLOOD PRESSURE SUPPORT)
PREFILLED_SYRINGE | INTRAVENOUS | Status: AC
  Filled 2019-02-05: qty 10

## 2019-02-05 MED ORDER — ACETAMINOPHEN 500 MG PO TABS
1000.0000 mg | ORAL_TABLET | Freq: Once | ORAL | Status: AC
  Administered 2019-02-05: 1000 mg via ORAL

## 2019-02-05 MED ORDER — LACTATED RINGERS IV SOLN
INTRAVENOUS | Status: DC
  Administered 2019-02-05: 18:00:00 via INTRAVENOUS

## 2019-02-05 MED ORDER — SENNOSIDES-DOCUSATE SODIUM 8.6-50 MG PO TABS
2.0000 | ORAL_TABLET | ORAL | Status: DC
  Administered 2019-02-07 – 2019-02-08 (×2): 2 via ORAL
  Filled 2019-02-05 (×3): qty 2

## 2019-02-05 MED ORDER — HYDROMORPHONE HCL 1 MG/ML IJ SOLN: 0.2000 mg | INTRAMUSCULAR | Status: DC | PRN

## 2019-02-05 MED ORDER — FENTANYL CITRATE (PF) 100 MCG/2ML IJ SOLN: 25.0000 ug | INTRAMUSCULAR | Status: DC | PRN

## 2019-02-05 MED ORDER — METOCLOPRAMIDE HCL 5 MG/ML IJ SOLN
INTRAMUSCULAR | Status: DC | PRN
  Administered 2019-02-05: 10 mg via INTRAVENOUS

## 2019-02-05 MED ORDER — DIPHENHYDRAMINE HCL 25 MG PO CAPS: 25.0000 mg | ORAL_CAPSULE | Freq: Four times a day (QID) | ORAL | Status: DC | PRN

## 2019-02-05 MED ORDER — ACETAMINOPHEN 325 MG PO TABS
650.0000 mg | ORAL_TABLET | ORAL | Status: DC | PRN
  Administered 2019-02-06 – 2019-02-07 (×3): 650 mg via ORAL
  Filled 2019-02-05 (×4): qty 2

## 2019-02-05 MED ORDER — METOCLOPRAMIDE HCL 5 MG/ML IJ SOLN
INTRAMUSCULAR | Status: AC
  Filled 2019-02-05: qty 2

## 2019-02-05 MED ORDER — DIPHENHYDRAMINE HCL 50 MG/ML IJ SOLN: 12.5000 mg | INTRAMUSCULAR | Status: DC | PRN

## 2019-02-05 MED ORDER — DEXAMETHASONE SODIUM PHOSPHATE 4 MG/ML IJ SOLN
INTRAMUSCULAR | Status: AC
  Filled 2019-02-05: qty 7

## 2019-02-05 MED ORDER — CEFAZOLIN SODIUM-DEXTROSE 2-4 GM/100ML-% IV SOLN
INTRAVENOUS | Status: AC
  Filled 2019-02-05: qty 100

## 2019-02-05 MED ORDER — SIMETHICONE 80 MG PO CHEW
80.0000 mg | CHEWABLE_TABLET | Freq: Three times a day (TID) | ORAL | Status: DC
  Administered 2019-02-06 – 2019-02-08 (×8): 80 mg via ORAL
  Filled 2019-02-05 (×8): qty 1

## 2019-02-05 MED ORDER — OXYCODONE HCL 5 MG PO TABS: 5.0000 mg | ORAL_TABLET | ORAL | Status: DC | PRN

## 2019-02-05 MED ORDER — DIPHENHYDRAMINE HCL 25 MG PO CAPS: 25.0000 mg | ORAL_CAPSULE | ORAL | Status: DC | PRN

## 2019-02-05 MED ORDER — SCOPOLAMINE 1 MG/3DAYS TD PT72
MEDICATED_PATCH | TRANSDERMAL | Status: AC
  Filled 2019-02-05: qty 1

## 2019-02-05 MED ORDER — ZOLPIDEM TARTRATE 5 MG PO TABS: 5.0000 mg | ORAL_TABLET | Freq: Every evening | ORAL | Status: DC | PRN

## 2019-02-05 MED ORDER — MEPERIDINE HCL 25 MG/ML IJ SOLN
6.2500 mg | INTRAMUSCULAR | Status: DC | PRN
  Administered 2019-02-05: 6.25 mg via INTRAVENOUS

## 2019-02-05 MED ORDER — PRENATAL MULTIVITAMIN CH
1.0000 | ORAL_TABLET | Freq: Every day | ORAL | Status: DC
  Administered 2019-02-06 – 2019-02-08 (×3): 1 via ORAL
  Filled 2019-02-05 (×3): qty 1

## 2019-02-05 MED ORDER — DEXAMETHASONE SODIUM PHOSPHATE 4 MG/ML IJ SOLN
INTRAMUSCULAR | Status: DC | PRN
  Administered 2019-02-05: 4 mg via INTRAVENOUS

## 2019-02-05 MED ORDER — COCONUT OIL OIL
1.0000 "application " | TOPICAL_OIL | Status: DC | PRN
  Administered 2019-02-06: 1 via TOPICAL

## 2019-02-05 MED ORDER — FENTANYL CITRATE (PF) 100 MCG/2ML IJ SOLN
INTRAMUSCULAR | Status: DC | PRN
  Administered 2019-02-05: 15 ug via INTRATHECAL

## 2019-02-05 MED ORDER — EPHEDRINE SULFATE 50 MG/ML IJ SOLN
INTRAMUSCULAR | Status: DC | PRN
  Administered 2019-02-05: 10 mg via INTRAVENOUS
  Administered 2019-02-05: 20 mg via INTRAVENOUS
  Administered 2019-02-05: 10 mg via INTRAVENOUS

## 2019-02-05 MED ORDER — SODIUM CHLORIDE 0.9 % IV SOLN
INTRAVENOUS | Status: DC | PRN
  Administered 2019-02-05: 08:00:00 via INTRAVENOUS

## 2019-02-05 MED ORDER — HYDROMORPHONE HCL 2 MG PO TABS: 2.0000 mg | ORAL_TABLET | ORAL | Status: DC | PRN

## 2019-02-05 MED ORDER — OXYTOCIN 40 UNITS IN NORMAL SALINE INFUSION - SIMPLE MED
INTRAVENOUS | Status: AC
  Filled 2019-02-05: qty 1000

## 2019-02-05 MED ORDER — PROMETHAZINE HCL 25 MG/ML IJ SOLN: 6.2500 mg | INTRAMUSCULAR | Status: DC | PRN

## 2019-02-05 MED ORDER — ONDANSETRON HCL 4 MG/2ML IJ SOLN
INTRAMUSCULAR | Status: AC
  Filled 2019-02-05: qty 2

## 2019-02-05 MED ORDER — IBUPROFEN 800 MG PO TABS
800.0000 mg | ORAL_TABLET | Freq: Three times a day (TID) | ORAL | Status: AC
  Administered 2019-02-05 – 2019-02-08 (×9): 800 mg via ORAL
  Filled 2019-02-05 (×10): qty 1

## 2019-02-05 MED ORDER — SIMETHICONE 80 MG PO CHEW
80.0000 mg | CHEWABLE_TABLET | ORAL | Status: DC
  Administered 2019-02-05 – 2019-02-08 (×3): 80 mg via ORAL
  Filled 2019-02-05 (×3): qty 1

## 2019-02-05 MED ORDER — SIMETHICONE 80 MG PO CHEW
80.0000 mg | CHEWABLE_TABLET | ORAL | Status: DC | PRN
  Administered 2019-02-06: 80 mg via ORAL
  Filled 2019-02-05: qty 1

## 2019-02-05 MED ORDER — MORPHINE SULFATE (PF) 0.5 MG/ML IJ SOLN
INTRAMUSCULAR | Status: DC | PRN
  Administered 2019-02-05: .15 ug via INTRATHECAL

## 2019-02-05 MED ORDER — OXYTOCIN 10 UNIT/ML IJ SOLN
INTRAMUSCULAR | Status: DC | PRN
  Administered 2019-02-05: 40 [IU]

## 2019-02-05 MED ORDER — LACTATED RINGERS IV SOLN
INTRAVENOUS | Status: DC | PRN
  Administered 2019-02-05 (×3): via INTRAVENOUS

## 2019-02-05 MED ORDER — CEFAZOLIN SODIUM-DEXTROSE 2-4 GM/100ML-% IV SOLN: 2.0000 g | INTRAVENOUS | Status: DC

## 2019-02-05 MED ORDER — SOD CITRATE-CITRIC ACID 500-334 MG/5ML PO SOLN
ORAL | Status: AC
  Filled 2019-02-05: qty 30

## 2019-02-05 MED ORDER — MEPERIDINE HCL 25 MG/ML IJ SOLN
INTRAMUSCULAR | Status: AC
  Filled 2019-02-05: qty 1

## 2019-02-05 MED ORDER — NALOXONE HCL 4 MG/10ML IJ SOLN
1.0000 ug/kg/h | INTRAVENOUS | Status: DC | PRN
  Filled 2019-02-05: qty 5

## 2019-02-05 MED ORDER — ONDANSETRON HCL 4 MG/2ML IJ SOLN
INTRAMUSCULAR | Status: DC | PRN
  Administered 2019-02-05: 4 mg via INTRAVENOUS

## 2019-02-05 MED ORDER — METHYLERGONOVINE MALEATE 0.2 MG/ML IJ SOLN: 0.2000 mg | INTRAMUSCULAR | Status: DC | PRN

## 2019-02-05 MED ORDER — WITCH HAZEL-GLYCERIN EX PADS: 1.0000 "application " | MEDICATED_PAD | CUTANEOUS | Status: DC | PRN

## 2019-02-05 MED ORDER — MENTHOL 3 MG MT LOZG: 1.0000 | LOZENGE | OROMUCOSAL | Status: DC | PRN

## 2019-02-05 MED ORDER — ACETAMINOPHEN 500 MG PO TABS
ORAL_TABLET | ORAL | Status: AC
  Filled 2019-02-05: qty 2

## 2019-02-05 MED ORDER — SOD CITRATE-CITRIC ACID 500-334 MG/5ML PO SOLN
30.0000 mL | ORAL | Status: AC
  Administered 2019-02-05: 30 mL via ORAL

## 2019-02-05 MED ORDER — SCOPOLAMINE 1 MG/3DAYS TD PT72
1.0000 | MEDICATED_PATCH | Freq: Once | TRANSDERMAL | Status: AC
  Administered 2019-02-05: 1.5 mg via TRANSDERMAL

## 2019-02-05 MED ORDER — METHYLERGONOVINE MALEATE 0.2 MG PO TABS: 0.2000 mg | ORAL_TABLET | ORAL | Status: DC | PRN

## 2019-02-05 MED ORDER — EPHEDRINE 5 MG/ML INJ
INTRAVENOUS | Status: AC
  Filled 2019-02-05: qty 10

## 2019-02-05 MED ORDER — OXYTOCIN 40 UNITS IN NORMAL SALINE INFUSION - SIMPLE MED
2.5000 [IU]/h | INTRAVENOUS | Status: AC
  Administered 2019-02-05: 2.5 [IU]/h via INTRAVENOUS

## 2019-02-05 MED ORDER — PHENYLEPHRINE HCL (PRESSORS) 10 MG/ML IV SOLN
INTRAVENOUS | Status: DC | PRN
  Administered 2019-02-05: 120 ug via INTRAVENOUS
  Administered 2019-02-05: 160 ug via INTRAVENOUS
  Administered 2019-02-05: 120 ug via INTRAVENOUS

## 2019-02-05 MED ORDER — SODIUM CHLORIDE 0.9% FLUSH: 3.0000 mL | INTRAVENOUS | Status: DC | PRN

## 2019-02-05 SURGICAL SUPPLY — 43 items
APL SKNCLS STERI-STRIP NONHPOA (GAUZE/BANDAGES/DRESSINGS) ×1
BARRIER ADHS 3X4 INTERCEED (GAUZE/BANDAGES/DRESSINGS) ×1 IMPLANT
BENZOIN TINCTURE PRP APPL 2/3 (GAUZE/BANDAGES/DRESSINGS) ×2 IMPLANT
BRR ADH 4X3 ABS CNTRL BYND (GAUZE/BANDAGES/DRESSINGS) ×1
CHLORAPREP W/TINT 26ML (MISCELLANEOUS) ×2 IMPLANT
CLAMP CORD UMBIL (MISCELLANEOUS) IMPLANT
CLOSURE STERI STRIP 1/2 X4 (GAUZE/BANDAGES/DRESSINGS) ×1 IMPLANT
CLOTH BEACON ORANGE TIMEOUT ST (SAFETY) ×2 IMPLANT
DRSG OPSITE POSTOP 4X10 (GAUZE/BANDAGES/DRESSINGS) ×2 IMPLANT
ELECT REM PT RETURN 9FT ADLT (ELECTROSURGICAL) ×2
ELECTRODE REM PT RTRN 9FT ADLT (ELECTROSURGICAL) ×1 IMPLANT
EXTRACTOR VACUUM KIWI (MISCELLANEOUS) IMPLANT
GAUZE SPONGE 4X4 12PLY STRL LF (GAUZE/BANDAGES/DRESSINGS) ×2 IMPLANT
GLOVE BIOGEL M 7.0 STRL (GLOVE) ×4 IMPLANT
GLOVE BIOGEL PI IND STRL 7.0 (GLOVE) ×3 IMPLANT
GLOVE BIOGEL PI INDICATOR 7.0 (GLOVE) ×3
GOWN STRL REUS W/TWL LRG LVL3 (GOWN DISPOSABLE) ×6 IMPLANT
HEMOSTAT ARISTA ABSORB 3G PWDR (HEMOSTASIS) ×1 IMPLANT
HOVERMATT SINGLE USE (MISCELLANEOUS) ×1 IMPLANT
KIT ABG SYR 3ML LUER SLIP (SYRINGE) IMPLANT
NDL HYPO 25X5/8 SAFETYGLIDE (NEEDLE) IMPLANT
NEEDLE HYPO 25X5/8 SAFETYGLIDE (NEEDLE) IMPLANT
NS IRRIG 1000ML POUR BTL (IV SOLUTION) ×2 IMPLANT
PACK C SECTION WH (CUSTOM PROCEDURE TRAY) ×2 IMPLANT
PAD ABD 7.5X8 STRL (GAUZE/BANDAGES/DRESSINGS) ×1 IMPLANT
PAD OB MATERNITY 4.3X12.25 (PERSONAL CARE ITEMS) ×2 IMPLANT
PENCIL SMOKE EVAC W/HOLSTER (ELECTROSURGICAL) ×2 IMPLANT
RTRCTR C-SECT PINK 25CM LRG (MISCELLANEOUS) IMPLANT
SPONGE LAP 18X18 X RAY DECT (DISPOSABLE) ×2 IMPLANT
STRIP CLOSURE SKIN 1/2X4 (GAUZE/BANDAGES/DRESSINGS) ×2 IMPLANT
SUT PDS AB 0 CT1 27 (SUTURE) ×4 IMPLANT
SUT PDS AB 0 CTX 36 PDP370T (SUTURE) ×2 IMPLANT
SUT PLAIN 0 NONE (SUTURE) IMPLANT
SUT VIC AB 0 CTX 36 (SUTURE) ×10
SUT VIC AB 0 CTX36XBRD ANBCTRL (SUTURE) ×3 IMPLANT
SUT VIC AB 2-0 CT1 27 (SUTURE) ×2
SUT VIC AB 2-0 CT1 TAPERPNT 27 (SUTURE) ×1 IMPLANT
SUT VIC AB 3-0 SH 27 (SUTURE)
SUT VIC AB 3-0 SH 27X BRD (SUTURE) IMPLANT
SUT VIC AB 4-0 KS 27 (SUTURE) ×2 IMPLANT
TOWEL OR 17X24 6PK STRL BLUE (TOWEL DISPOSABLE) ×2 IMPLANT
TRAY FOLEY W/BAG SLVR 14FR LF (SET/KITS/TRAYS/PACK) ×2 IMPLANT
WATER STERILE IRR 1000ML POUR (IV SOLUTION) ×2 IMPLANT

## 2019-02-05 NOTE — Anesthesia Postprocedure Evaluation (Signed)
Anesthesia Post Note  Patient: Kiara Hall  Procedure(s) Performed: CESAREAN SECTION (N/A Abdomen)     Patient location during evaluation: PACU Anesthesia Type: Spinal Level of consciousness: awake and alert Pain management: pain level controlled Vital Signs Assessment: post-procedure vital signs reviewed and stable Respiratory status: spontaneous breathing and respiratory function stable Cardiovascular status: blood pressure returned to baseline and stable Postop Assessment: spinal receding Anesthetic complications: no    Last Vitals:  Vitals:   02/05/19 1125 02/05/19 1230  BP: 120/69 (!) 114/59  Pulse: (!) 114 (!) 102  Resp: 18 16  Temp: 37.1 C 36.7 C  SpO2: 98% 100%    Last Pain:  Vitals:   02/05/19 1230  TempSrc: Oral  PainSc:    Pain Goal:                   Xaria Judon DANIEL

## 2019-02-05 NOTE — Progress Notes (Signed)
Orthostatic vital signs obtained and patient ambulated to bathroom. Pt had no complaints of pain or dizziness during ambulation. Patient now sitting in recliner chair. Pt states that she is comfortable and has no pain. LR running at 142ml/hr. Tolerating clear liquids well. Encouraged patient to continue drinking plenty of water and to place dinner order. Will continue to monitor.

## 2019-02-05 NOTE — Lactation Note (Signed)
This note was copied from a baby's chart. Lactation Consultation Note  Patient Name: Kiara Hall [id number] Date: 02/05/2019 Reason for consult: Initial assessment;Term(just returned from NICU)  I conducted an initial consult with Ms. [name], an experienced breast feeding multip. Ms. [name] was breast feeding her son, Kiara Hall, in cradle hold upon entry. I noted wide latch with a slightly tucked lower lip, which we corrected manually at the breast.  Baby appeared a little sleepy at the breast. I showed Ms. [name] how to gently pester him and how to do breast compressions to encourage more actively. I showed her how to breast suction if needed to release latch.  Ms. [name] breast fed her first child 9-10 months with no issues. He is now four. She pumped when separated from baby initially and produced 10 mls. I retrieved that from the NICU and provided it to her. We discussed how to feed by spoon or with foley cup. Mom may continue to do some pumping with the DEBP today to be on the safe side. I recommended breast feeding first, pumping after (as needed) and feeding EBM back to baby. She will store in the refrigerator.  I showed Ms. [name] how to HE after she breast fed her son (and he went to sleep). Her colostrum expresses easily, and we spoon fed several drops back to baby.  I educated on breast feeding basics.  Recommended breast feeding 8-12 times a day on demand and wake to feed as needed. Pump at her discretion and feed volume back to baby by cup or spoon today. Call lactation as needed.  Maternal Data Formula Feeding for Exclusion: No Has patient been taught Hand Expression?: Yes Does the patient have breastfeeding experience prior to this delivery?: Yes  Feeding Feeding Type: Breast Fed  LATCH Score Latch: Repeated attempts needed to sustain latch, nipple held in mouth throughout feeding, stimulation needed to elicit sucking reflex.  Audible Swallowing: A few with  stimulation  Type of Nipple: Everted at rest and after stimulation  Comfort (Breast/Nipple): Soft / non-tender  Hold (Positioning): No assistance needed to correctly position infant at breast.  LATCH Score: 8  Interventions Interventions: Breast feeding basics reviewed;Hand express;Skin to skin;Breast compression;Expressed milk;DEBP  Lactation Tools Discussed/Used Tools: Feeding cup;Other (comment)(spoon) Pump Review: Setup, frequency, and cleaning   Consult Status Consult Status: Follow-up Date: 02/06/19 Follow-up type: In-patient    Lenore Manner 02/05/2019, 4:40 PM

## 2019-02-05 NOTE — Progress Notes (Signed)
Patient set up with DEBP due to baby transitioning in the NICU. Pt given instructions on how to use pump, storage and clean up of pump parts. Mom was able to express 55ml of colostrum after pumping for 20 minutes. Expressed breast milk taken to NICU and given to NICU nurse.

## 2019-02-05 NOTE — Progress Notes (Signed)
Chart review completed. CSW will see MOB at a later time to complete psychosocial assessment for infant's NICU admission.   Abundio Miu, [place] Worker [facility] Cell#: [phone]

## 2019-02-05 NOTE — Op Note (Signed)
Cesarean Section Procedure Note  Indications: previous uterine incision h/o cesarean section .. patient desires repeat   Pre-operative Diagnosis: 39 week 0 day pregnancy.  Post-operative Diagnosis: same  Surgeon: [name] M.D.  Assistants: Dr. Renaee Munda assisted due to concern for adhesive disease   Anesthesia: Spinal anesthesia  ASA Class: 2   Procedure Details   The patient was seen in the Holding Room. The risks, benefits, complications, treatment options, and expected outcomes were discussed with the patient.  The patient concurred with the proposed plan, giving informed consent.  The site of surgery properly noted/marked. The patient was taken to Operating Room # B, identified as Kiara Hall and the procedure verified as C-Section Delivery. A Time Out was held and the above information confirmed.  After induction of anesthesia, the patient was draped and prepped in the usual sterile manner. A Pfannenstiel incision was made and carried down through the subcutaneous tissue to the fascia. Fascial incision was made and extended transversely. The fascia was separated from the underlying rectus tissue superiorly and inferiorly. The peritoneum was identified and entered. Peritoneal incision was extended longitudinally. The utero-vesical peritoneal reflection was incised transversely and the bladder flap was bluntly freed from the lower uterine segment. A low transverse uterine incision was made. Delivered from breech  presentation was a 3690 gram Female with Apgar scores of 7 at one minute and 9 at five minutes. After the umbilical cord was clamped and cut cord blood was obtained for evaluation. The placenta was removed intact and appeared normal. The uterine outline, tubes and ovaries appeared normal. The uterine incision was closed with running locked sutures of 0 vicryl. A second layer of 0 vicryl was used to imbricate the incision . Lavage was carried out until clear Slight oozing  from the bladder reflection. Arista and interseed placed.  Hemostasis was observed.The fascia was then reapproximated with running sutures of 0 pds. Subcutaneous layer reapproximated with 2-0 vicryl.  The skin was reapproximated with 4-0 vicryl. .  Instrument, sponge, and needle counts were correct prior the abdominal closure and at the conclusion of the case.   Findings: Female infant in the breech presentation. Dense. Adhesions of the bladder to the lower uterine segment . Normal fallopian tubes and ovaries.   Estimated Blood Loss:  524 mL         Drains: None         Total IV Fluids:  Per anesthesia ml         Specimens: Placenta sent to labor and delivery           Implants: None         Complications:  None; patient tolerated the procedure well.         Disposition: PACU - hemodynamically stable.         Condition: stable  Attending Attestation: I performed the procedure.

## 2019-02-05 NOTE — Anesthesia Procedure Notes (Signed)
Spinal  Patient location during procedure: OR Start time: 02/05/2019 7:29 AM End time: 02/05/2019 7:37 AM Staffing Anesthesiologist: Duane Boston, MD Performed: anesthesiologist  Preanesthetic Checklist Completed: patient identified, surgical consent, pre-op evaluation, timeout performed, IV checked, risks and benefits discussed and monitors and equipment checked Spinal Block Patient position: sitting Prep: DuraPrep Patient monitoring: cardiac monitor, continuous pulse ox and blood pressure Approach: midline Location: L2-3 Injection technique: single-shot Needle Needle type: Pencan  Needle gauge: 24 G Needle length: 9 cm Additional Notes Functioning IV was confirmed and monitors were applied. Sterile prep and drape, including hand hygiene and sterile gloves were used. The patient was positioned and the spine was prepped. The skin was anesthetized with lidocaine.  Free flow of clear CSF was obtained prior to injecting local anesthetic into the CSF.  The spinal needle aspirated freely following injection.  The needle was carefully withdrawn.  The patient tolerated the procedure well.

## 2019-02-05 NOTE — Transfer of Care (Signed)
Immediate Anesthesia Transfer of Care Note  Patient: Kiara Hall  Procedure(s) Performed: CESAREAN SECTION (N/A Abdomen)  Patient Location: PACU  Anesthesia Type:Spinal  Level of Consciousness: awake, alert  and oriented  Airway & Oxygen Therapy: Patient Spontanous Breathing  Post-op Assessment: Report given to RN and Post -op Vital signs reviewed and stable  Post vital signs: Reviewed and stable  Last Vitals:  Vitals Value Taken Time  BP 95/85 02/05/19 0911  Temp    Pulse 106 02/05/19 0917  Resp 18 02/05/19 0917  SpO2 99 % 02/05/19 0917  Vitals shown include unvalidated device data.  Last Pain:  Vitals:   02/05/19 0911  TempSrc:   PainSc: (P) 0-No pain         Complications: No apparent anesthesia complications

## 2019-02-05 NOTE — H&P (Signed)
Date of Initial H&P: 02/05/2019  History reviewed, patient examined, no change in status, stable for surgery.

## 2019-02-06 LAB — CBC
HCT: 29.9 % — ABNORMAL LOW (ref 36.0–46.0)
Hemoglobin: 9.7 g/dL — ABNORMAL LOW (ref 12.0–15.0)
MCH: 26.1 pg (ref 26.0–34.0)
MCHC: 32.4 g/dL (ref 30.0–36.0)
MCV: 80.6 fL (ref 80.0–100.0)
Platelets: 127 10*3/uL — ABNORMAL LOW (ref 150–400)
RBC: 3.71 MIL/uL — ABNORMAL LOW (ref 3.87–5.11)
RDW: 18.6 % — ABNORMAL HIGH (ref 11.5–15.5)
WBC: 7.9 10*3/uL (ref 4.0–10.5)
nRBC: 0 % (ref 0.0–0.2)

## 2019-02-06 MED ORDER — ACETAMINOPHEN 325 MG PO TABS: 650.0000 mg | ORAL_TABLET | ORAL | Status: DC | PRN

## 2019-02-06 MED ORDER — IBUPROFEN 800 MG PO TABS: 800.0000 mg | ORAL_TABLET | Freq: Three times a day (TID) | ORAL | 1 refills | Status: DC | PRN

## 2019-02-06 MED ORDER — OXYCODONE HCL 5 MG PO TABS: 5.0000 mg | ORAL_TABLET | ORAL | 0 refills | Status: DC | PRN

## 2019-02-06 NOTE — Progress Notes (Signed)
Subjective: Postpartum Day 1: Cesarean Delivery Patient reports incisional pain and tolerating PO.    Objective: Vital signs in last 24 hours: Temp:  [97.8 F (36.6 C)-99.3 F (37.4 C)] 99.3 F (37.4 C) (10/07 1435) Pulse Rate:  [87-107] 107 (10/07 1435) Resp:  [16-20] 20 (10/07 1435) BP: (119-131)/(60-75) 131/72 (10/07 1435) SpO2:  [98 %-100 %] 100 % (10/07 0500)  Physical Exam:  General: alert, cooperative and no distress Lochia: appropriate Uterine Fundus: firm Incision: bandage clean dry and intact   DVT Evaluation: No evidence of DVT seen on physical exam.  Recent Labs    02/06/19 0622  HGB 9.7*  HCT 29.9*    Assessment/Plan: Status post Cesarean section. Doing well postoperatively.  Pain control- continue ibuprofen 800 mg q 8 hours scheduled. Prn tylenol and oxy ordered  Encourage ambulation Pt desires circumcision of newborn/ r/b/a reviewed. [name] 02/06/2019, 5:29 PM

## 2019-02-06 NOTE — Progress Notes (Signed)
CSW will not complete psychosocial assessment for infant's NICU admission due to infant transitioning to mother baby unit.  Abundio Miu, [place] Worker [facility] Cell#: [phone]

## 2019-02-07 NOTE — Progress Notes (Signed)
Subjective: Postpartum Day 1: Cesarean Delivery Resting comfortably.  No F/C/CP/SOB.  Tolerating gen diet.  No flatus, no BM.  Lochia appropriate.  Voiding and ambulating without difficulty    Objective: Vital signs in last 24 hours: Temp:  [98.2 F (36.8 C)-99.3 F (37.4 C)] 98.2 F (36.8 C) (10/08 0520) Pulse Rate:  [91-107] 91 (10/08 0520) Resp:  [18-20] 18 (10/08 0520) BP: (122-131)/(67-72) 122/67 (10/08 0520) SpO2:  [100 %] 100 % (10/07 2209)  Physical Exam:  General: alert, cooperative and no distress  CV: RRR Lungs: CTAB Abd: mild distension, BS quiet Lochia: appropriate Uterine Fundus: firm below umbilicus Incision: bandage clean dry and intact   DVT Evaluation: No evidence of DVT seen on physical exam.  Recent Labs    02/06/19 0622  HGB 9.7*  HCT 29.9*    Assessment/Plan: Status post Cesarean section.POD#2  Doing well postoperatively.  Pain control- continue ibuprofen 800 mg q 8 hours scheduled. Prn tylenol and oxy ordered  Encourage ambulation Baby boy circ completed  DISPO: Will consider early discharge pending improvement of BS or passage of gas.  Meeting other postop milestones appropriately  Kiara Hall 02/07/2019, 6:53 AM

## 2019-02-07 NOTE — Discharge Summary (Signed)
OB Discharge Summary     Patient Name: Kiara Hall DOB: January 30, 1981 MRN: [id number]  Date of admission: 02/05/2019 Delivering MD: Gerald Leitz   Date of discharge: 02/07/2019  Admitting diagnosis: O34 219 H O cesarean section Intrauterine pregnancy: [redacted]w[redacted]d     Secondary diagnosis:  Active Problems:   Delivery of pregnancy by cesarean section  Additional problems: none     Discharge diagnosis: Term Pregnancy Delivered                                                                                                Post partum procedures:none  Augmentation: n/a  Complications: None  Hospital course:  Sceduled C/S   38 y.o. yo G2P2002 at [redacted]w[redacted]d was admitted to the hospital 02/05/2019 for scheduled cesarean section with the following indication:Elective Repeat.  Membrane Rupture Time/Date: 8:07 AM ,02/05/2019   Patient delivered a Viable infant.02/05/2019  Details of operation can be found in separate operative note.  Pateint had an uncomplicated postpartum course.  She is ambulating, tolerating a regular diet, passing flatus, and urinating well. Patient is discharged home in stable condition on  02/07/19         Physical exam  Vitals:   02/06/19 1435 02/06/19 2209 02/07/19 0520 02/07/19 1445  BP: 131/72 130/67 122/67 101/69  Pulse: (!) 107 96 91 (!) 101  Resp: 20 20 18 18   Temp: 99.3 F (37.4 C) 98.4 F (36.9 C) 98.2 F (36.8 C) 98.3 F (36.8 C)  TempSrc: Oral Oral Oral Oral  SpO2:  100%    Weight:      Height:       General: alert, cooperative and no distress Lochia: appropriate Uterine Fundus: firm Incision: Dressing is clean, dry, and intact DVT Evaluation: No evidence of DVT seen on physical exam. Labs: Lab Results  Component Value Date   WBC 7.9 02/06/2019   HGB 9.7 (L) 02/06/2019   HCT 29.9 (L) 02/06/2019   MCV 80.6 02/06/2019   PLT 127 (L) 02/06/2019   CMP Latest Ref Rng & Units 10/31/2017  Glucose 70 - 99 mg/dL 86  BUN 6 - 23 mg/dL 12  Creatinine 01/01/2018 -  1.20 mg/dL 9.41  Sodium 7.40 - 814 mEq/L 135  Potassium 3.5 - 5.1 mEq/L 4.3  Chloride 96 - 112 mEq/L 102  CO2 19 - 32 mEq/L 26  Calcium 8.4 - 10.5 mg/dL 9.3  Total Protein 6.0 - 8.3 g/dL 7.3  Total Bilirubin 0.2 - 1.2 mg/dL 0.5  Alkaline Phos 39 - 117 U/L 39  AST 0 - 37 U/L 15  ALT 0 - 35 U/L 10    Discharge instruction: per After Visit Summary and "Baby and Me Booklet".  After visit meds:  Allergies as of 02/07/2019      Reactions   Metronidazole Hives, Shortness Of Breath   Flagyl/rash      Medication List    STOP taking these medications   diphenhydrAMINE 25 MG tablet Commonly known as: BENADRYL   docusate sodium 100 MG capsule Commonly known as: COLACE   IRON PO   linaclotide 145 MCG Caps  capsule Commonly known as: Linzess   loratadine 10 MG tablet Commonly known as: CLARITIN   phentermine 37.5 MG capsule   PRENATAL ADULT GUMMY/DHA/FA PO     TAKE these medications   acetaminophen 325 MG tablet Commonly known as: TYLENOL Take 2 tablets (650 mg total) by mouth every 4 (four) hours as needed for mild pain (temperature > 101.5.). What changed:   medication strength  how much to take  when to take this  reasons to take this   ibuprofen 800 MG tablet Commonly known as: ADVIL Take 1 tablet (800 mg total) by mouth every 8 (eight) hours as needed.   oxyCODONE 5 MG immediate release tablet Commonly known as: Oxy IR/ROXICODONE Take 1-2 tablets (5-10 mg total) by mouth every 4 (four) hours as needed for moderate pain.       Diet: routine diet  Activity: Advance as tolerated. Pelvic rest for 6 weeks.   Outpatient follow up:2 weeks Follow up Appt:No future appointments. Follow up Visit:No follow-ups on file.  Postpartum contraception: Not Discussed  Newborn Data: Live born female  Birth Weight: 8 lb 11.7 oz (3960 g) APGAR: 7, 9  Newborn Delivery   Birth date/time: 02/05/2019 08:09:00 Delivery type: C-Section, Low Vertical Trial of labor:  No C-section categorization: Repeat      Baby Feeding: Breast Disposition:home with mother   02/07/2019 Annalee Genta, DO

## 2019-02-07 NOTE — Lactation Note (Addendum)
This note was copied from a baby's chart. Lactation Consultation Note Mom stating baby cluster feeding she is worried about him being hungry. Hand expressed easy flow of colostrum. Discussed cluster feeding and importance of I&O. Encouraged to breast massage at intervals during feeding. Baby sleeping. Explained baby's don't like to lay in the crib during cluster feeding times. Mom states she's so sleepy and needs him to be in crib so she can take a nap. RN offered to hold baby while mom napped. Mom declined. [place] placed baby in crib swaddled. Sleeping at this time.  Will evaluate I&O and wt. Loss this am. Explained to mom 6% is a lot the first 24 hrs.   Patient Name: Kiara Hall DIYME'B Date: 02/07/2019 Reason for consult: Mother's request   Maternal Data    Feeding Feeding Type: Breast Fed  LATCH Score Latch: Repeated attempts needed to sustain latch, nipple held in mouth throughout feeding, stimulation needed to elicit sucking reflex.  Audible Swallowing: A few with stimulation  Type of Nipple: Everted at rest and after stimulation  Comfort (Breast/Nipple): Soft / non-tender  Hold (Positioning): No assistance needed to correctly position infant at breast.  LATCH Score: 7  Interventions Interventions: Breast feeding basics reviewed;Hand express  Lactation Tools Discussed/Used     Consult Status Consult Status: Follow-up Date: 02/07/19 Follow-up type: In-patient    Lamont Tant, Elta [place] 02/07/2019, 3:13 AM

## 2019-02-07 NOTE — Discharge Instructions (Signed)

## 2019-02-08 NOTE — Lactation Note (Signed)
This note was copied from a baby's chart. Lactation Consultation Note  Patient Name: Kiara Hall [id number] Date: 02/08/2019 Reason for consult: Follow-up assessment;Hyperbilirubinemia;Infant weight loss;Term  LC in to visit with P2 Mom of term baby on day of possible discharge.  Baby is 56 hrs old and at 10% weight loss.  Baby was on double phototherapy which was DC'd this am.  Serum bilirubin to be done at 3pm.   Mom states that breasts are filling, and baby is latching to breast well.  Using coconut oil on nipples.    18 ml of EBM sitting on bedside for after next breastfeeding.  Plan- 1- Keep baby STS as much as possible 2- Offer breast with cues, waking baby if he sleeps 3 hrs. 3- Pump both breasts 15-20 mins 4- Offer baby 15-30 ml of EBM+/formula by paced bottle  Mom aware of IP and OP lactation support available to her.  Encouraged to call prn if discharged today.   Consult Status Consult Status: Follow-up Date: 02/09/19 Follow-up type: In-patient    Kiara Hall 02/08/2019, 11:15 AM

## 2019-02-08 NOTE — Discharge Summary (Signed)
OB Discharge Summary     Patient Name: Kiara Hall DOB: 09/10/1980 MRN: [id number]  Date of admission: 02/05/2019 Delivering MD: [name]   Date of discharge: 02/08/2019  Admitting diagnosis: O34 219 H O cesarean section Intrauterine pregnancy: [redacted]w[redacted]d     Secondary diagnosis:  Active Problems:   Delivery of pregnancy by cesarean section  Additional problems: Anemia      Discharge diagnosis: Term Pregnancy Delivered                                                                                                Post partum procedures:None  Augmentation: NA  Complications: None  Hospital course:  Sceduled C/S   38 y.o. yo G2P2002 at [redacted]w[redacted]d was admitted to the hospital 02/05/2019 for scheduled cesarean section with the following indication:Elective Repeat.  Membrane Rupture Time/Date: 8:07 AM ,02/05/2019   Patient delivered a Viable infant.02/05/2019  Details of operation can be found in separate operative note.  Pateint had an uncomplicated postpartum course.  She is ambulating, tolerating a regular diet, passing flatus, and urinating well. Patient is discharged home in stable condition on  02/08/19         Physical exam  Vitals:   02/07/19 0520 02/07/19 1445 02/07/19 2145 02/08/19 0602  BP: 122/67 101/69 133/74 123/74  Pulse: 91 (!) 101 (!) 103 97  Resp: 18 18 18 17   Temp: 98.2 F (36.8 C) 98.3 F (36.8 C) 98.5 F (36.9 C) 98.1 F (36.7 C)  TempSrc: Oral Oral  Oral  SpO2:    100%  Weight:      Height:       General: alert, cooperative and no distress Lochia: appropriate Uterine Fundus: firm Incision: Dressing is clean, dry, and intact DVT Evaluation: No evidence of DVT seen on physical exam. Labs: Lab Results  Component Value Date   WBC 7.9 02/06/2019   HGB 9.7 (L) 02/06/2019   HCT 29.9 (L) 02/06/2019   MCV 80.6 02/06/2019   PLT 127 (L) 02/06/2019   CMP Latest Ref Rng & Units 10/31/2017  Glucose 70 - 99 mg/dL 86  BUN 6 - 23 mg/dL 12  Creatinine 0.40 - 1.20  mg/dL 0.56  Sodium 135 - 145 mEq/L 135  Potassium 3.5 - 5.1 mEq/L 4.3  Chloride 96 - 112 mEq/L 102  CO2 19 - 32 mEq/L 26  Calcium 8.4 - 10.5 mg/dL 9.3  Total Protein 6.0 - 8.3 g/dL 7.3  Total Bilirubin 0.2 - 1.2 mg/dL 0.5  Alkaline Phos 39 - 117 U/L 39  AST 0 - 37 U/L 15  ALT 0 - 35 U/L 10    Discharge instruction: per After Visit Summary and "Baby and Me Booklet".  After visit meds:  Allergies as of 02/08/2019      Reactions   Metronidazole Hives, Shortness Of Breath   Flagyl/rash      Medication List    STOP taking these medications   diphenhydrAMINE 25 MG tablet Commonly known as: BENADRYL   docusate sodium 100 MG capsule Commonly known as: COLACE   IRON PO   linaclotide 145 MCG  Caps capsule Commonly known as: Linzess   loratadine 10 MG tablet Commonly known as: CLARITIN   phentermine 37.5 MG capsule   PRENATAL ADULT GUMMY/DHA/FA PO     TAKE these medications   acetaminophen 325 MG tablet Commonly known as: TYLENOL Take 2 tablets (650 mg total) by mouth every 4 (four) hours as needed for mild pain (temperature > 101.5.). What changed:   medication strength  how much to take  when to take this  reasons to take this   ibuprofen 800 MG tablet Commonly known as: ADVIL Take 1 tablet (800 mg total) by mouth every 8 (eight) hours as needed.   oxyCODONE 5 MG immediate release tablet Commonly known as: Oxy IR/ROXICODONE Take 1-2 tablets (5-10 mg total) by mouth every 4 (four) hours as needed for moderate pain.       Diet: routine diet  Activity: Advance as tolerated. Pelvic rest for 6 weeks.   Outpatient follow up:2 weeks Follow up Appt:No future appointments. Follow up Visit:No follow-ups on file.  Postpartum contraception: Not Discussed  Newborn Data: Live born female  Birth Weight: 8 lb 11.7 oz (3960 g) APGAR: 7, 9  Newborn Delivery   Birth date/time: 02/05/2019 08:09:00 Delivery type: C-Section, Low Vertical Trial of labor:  No C-section categorization: Repeat      Baby Feeding: Breast Disposition:home with mother   02/08/2019 Gerald Leitz, MD

## 2019-03-19 DIAGNOSIS — Z98891 History of uterine scar from previous surgery: Secondary | ICD-10-CM | POA: Diagnosis not present

## 2019-04-05 DIAGNOSIS — K080 Exfoliation of teeth due to systemic causes: Secondary | ICD-10-CM | POA: Diagnosis not present

## 2019-04-30 DIAGNOSIS — Z3202 Encounter for pregnancy test, result negative: Secondary | ICD-10-CM | POA: Diagnosis not present

## 2019-04-30 DIAGNOSIS — Z30430 Encounter for insertion of intrauterine contraceptive device: Secondary | ICD-10-CM | POA: Diagnosis not present

## 2019-06-13 DIAGNOSIS — R102 Pelvic and perineal pain: Secondary | ICD-10-CM | POA: Diagnosis not present

## 2019-06-13 DIAGNOSIS — N939 Abnormal uterine and vaginal bleeding, unspecified: Secondary | ICD-10-CM | POA: Diagnosis not present

## 2019-06-13 DIAGNOSIS — Z30431 Encounter for routine checking of intrauterine contraceptive device: Secondary | ICD-10-CM | POA: Diagnosis not present

## 2019-06-17 DIAGNOSIS — Z20828 Contact with and (suspected) exposure to other viral communicable diseases: Secondary | ICD-10-CM | POA: Diagnosis not present

## 2019-06-17 DIAGNOSIS — Z20822 Contact with and (suspected) exposure to covid-19: Secondary | ICD-10-CM | POA: Diagnosis not present

## 2019-06-19 DIAGNOSIS — N939 Abnormal uterine and vaginal bleeding, unspecified: Secondary | ICD-10-CM | POA: Diagnosis not present

## 2019-06-19 DIAGNOSIS — R102 Pelvic and perineal pain: Secondary | ICD-10-CM | POA: Diagnosis not present

## 2019-06-19 DIAGNOSIS — Z30431 Encounter for routine checking of intrauterine contraceptive device: Secondary | ICD-10-CM | POA: Diagnosis not present

## 2019-06-25 IMAGING — CR DG CHEST 2V
2 series · 2 of 2 positions shown · non-contrast
Comparison: 02/03/2014 chest radiograph.

CLINICAL DATA: Dyspnea

EXAM:
CHEST - 2 VIEW

[w chest pa]
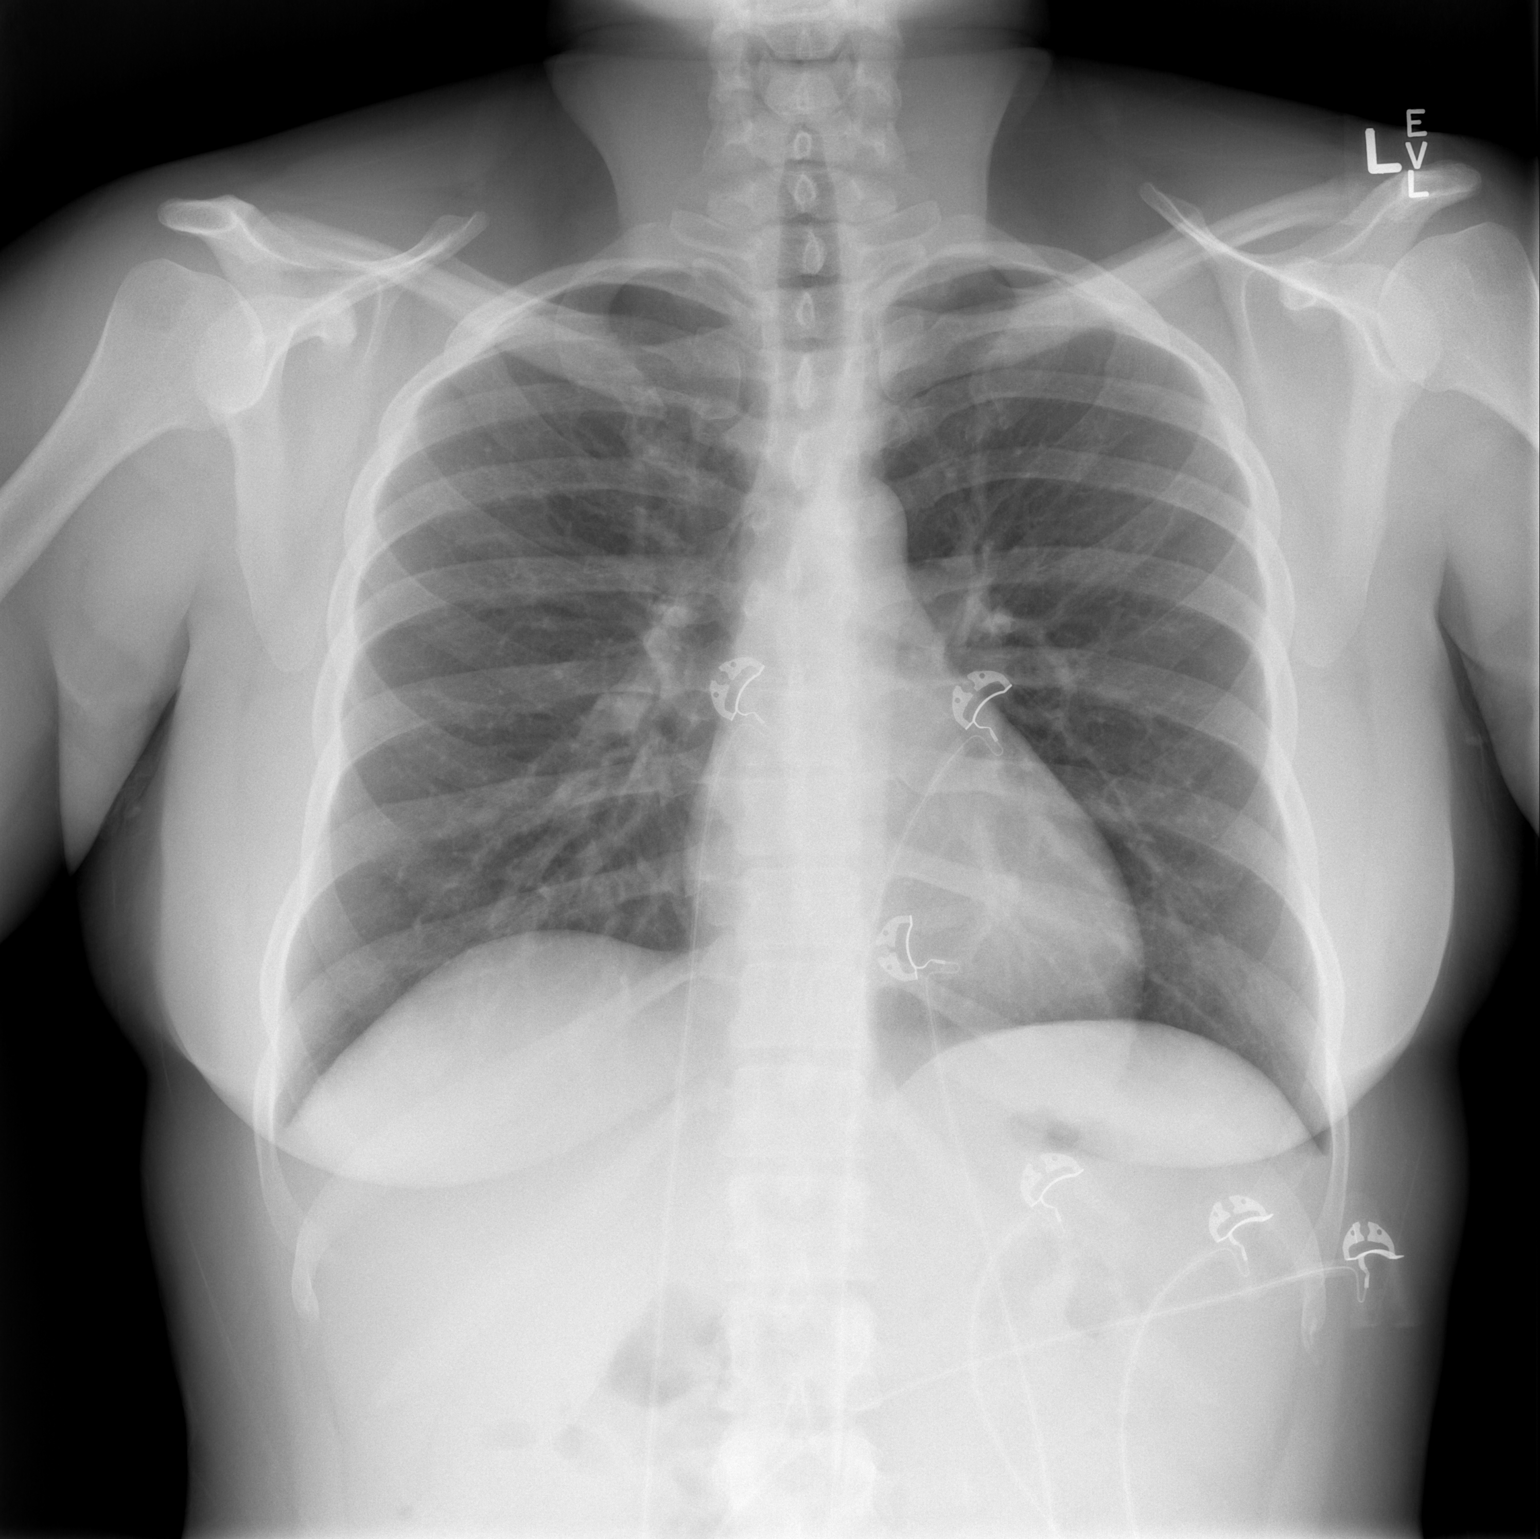

[w chest lat]
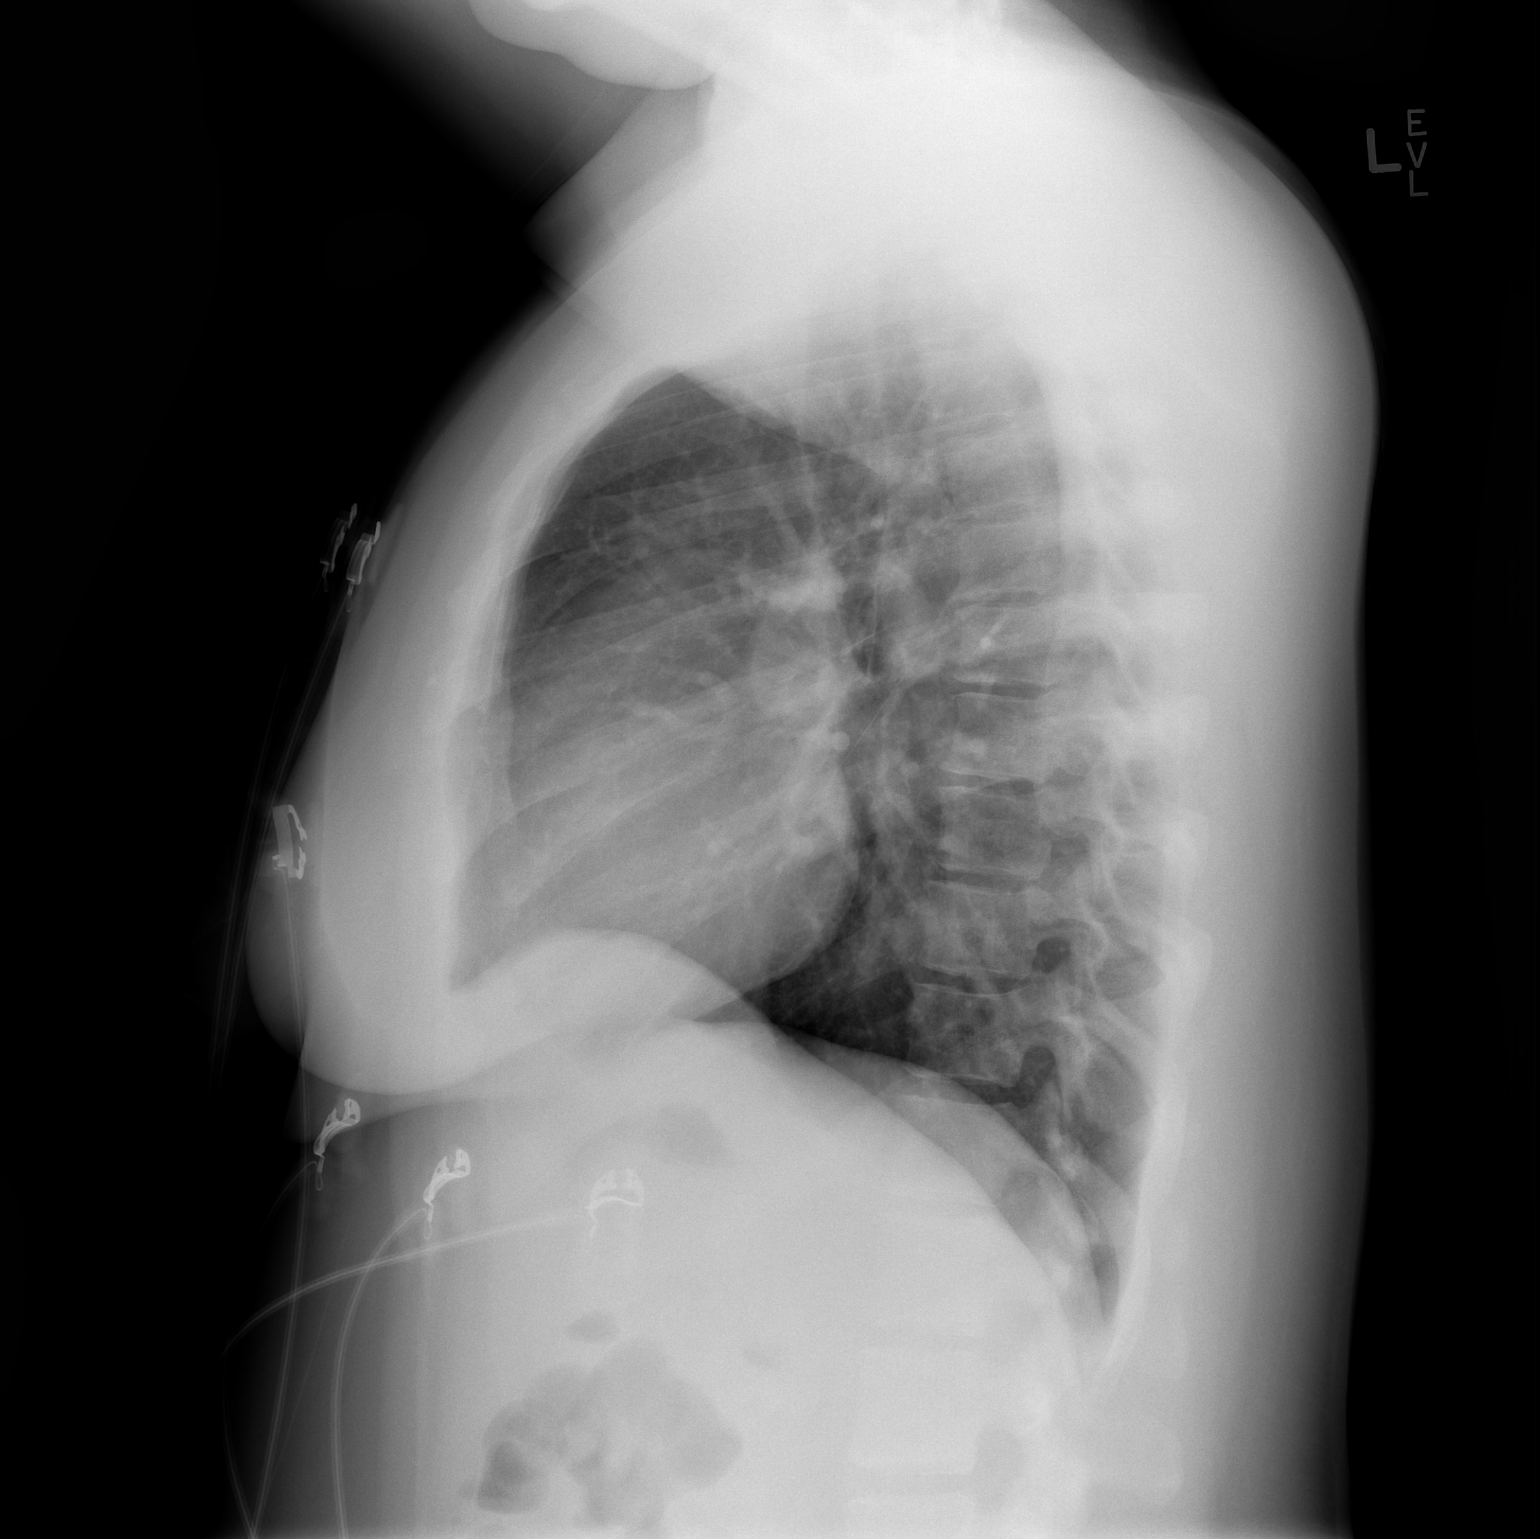

[2 of 2 positions shown; findings below may reference images not displayed]

FINDINGS: Stable cardiomediastinal silhouette with normal heart size. No
pneumothorax. No pleural effusion. Lungs appear clear, with no acute
consolidative airspace disease and no pulmonary edema.
IMPRESSION: No active cardiopulmonary disease.

## 2019-08-23 ENCOUNTER — Other Ambulatory Visit: Payer: Self-pay

## 2019-08-23 ENCOUNTER — Telehealth (INDEPENDENT_AMBULATORY_CARE_PROVIDER_SITE_OTHER): Payer: Federal, State, Local not specified - PPO | Admitting: Nurse Practitioner

## 2019-08-23 ENCOUNTER — Encounter: Payer: Self-pay | Admitting: Nurse Practitioner

## 2019-08-23 VITALS — Ht 61.0 in | Wt 214.0 lb

## 2019-08-23 DIAGNOSIS — Z6841 Body Mass Index (BMI) 40.0 and over, adult: Secondary | ICD-10-CM

## 2019-08-23 DIAGNOSIS — F418 Other specified anxiety disorders: Secondary | ICD-10-CM | POA: Diagnosis not present

## 2019-08-23 DIAGNOSIS — F410 Panic disorder [episodic paroxysmal anxiety] without agoraphobia: Secondary | ICD-10-CM | POA: Insufficient documentation

## 2019-08-23 DIAGNOSIS — E66813 Obesity, class 3: Secondary | ICD-10-CM

## 2019-08-23 MED ORDER — ALPRAZOLAM 0.25 MG PO TABS: 0.2500 mg | ORAL_TABLET | Freq: Every day | ORAL | 0 refills | Status: DC | PRN

## 2019-08-23 MED ORDER — PHENTERMINE HCL 15 MG PO CAPS: 15.0000 mg | ORAL_CAPSULE | ORAL | 0 refills | Status: DC

## 2019-08-23 NOTE — Assessment & Plan Note (Signed)
Related to wedding preparations and upcoming trip to [place]. Wedding scheduled for 01/2020 and [place] trip for 10/2019. Reports feeling overwhelmed and increased irritability in last 54months. Does not want any daily medication at this time.  Rx Alprazolam 0.25mg , #14tabs provided, she is aware this will not be refilled Advised to consider use of effexor or buspar. F/up in 38month

## 2019-08-23 NOTE — Patient Instructions (Addendum)
Rx Alprazolam 0.25mg , #14tabs provided, she is aware this will not be refilled Advised to consider use of effexor or buspar.  Advised about potential increase irritability and anxiety with phentermine. Encourage to continue weight loss program and daily exercise  Rx phentermine 15mg  EOD sent, #30 F/up in 66month

## 2019-08-23 NOTE — Progress Notes (Signed)
Virtual Visit via Video Note  I connected with@ on 08/23/19 at  8:00 AM EDT by a video enabled telemedicine application and verified that I am speaking with the correct person using two identifiers.  Location: Patient:Home Provider: Office Participants: patient and provider  I discussed the limitations of evaluation and management by telemedicine and the availability of in person appointments. I also discussed with the patient that there may be a patient responsible charge related to this service. The patient expressed understanding and agreed to proceed.  JT:TSVXBLT consult--planing for a wedding--need help during stressful times/ weight consult in preparation for her wedding 01/2020  History of Present Illness: Weight Management: BMI of 40 Reports 10lbs weight loss with online weight loss program and exercise in last 8wks. Will like to use phentermine. Last filled 08/03/2018, 06/03/2018, 05/11/2018. No longer breast feeding. Wt Readings from Last 3 Encounters:  08/23/19 214 lb (97.1 kg)  02/05/19 244 lb 12.8 oz (111 kg)  01/21/19 244 lb (110.7 kg)    Related to wedding preparations and upcoming trip to [place]. Wedding scheduled for 01/2020 and [place] trip for 10/2019. Reports feeling overwhelmed and increased irritability in last 19months. Does not want any daily medication at this time. Depression screen [facility] 2/9 08/23/2019 05/17/2018 10/31/2017  Decreased Interest 0 0 0  Down, Depressed, Hopeless 0 - 0  PHQ - 2 Score 0 0 0  Altered sleeping 0 - -  Tired, decreased energy 0 - -  Change in appetite 0 - -  Feeling bad or failure about yourself  1 - -  Trouble concentrating 0 - -  Moving slowly or fidgety/restless 0 - -  Suicidal thoughts 0 - -  PHQ-9 Score 1 - -   GAD 7 : Generalized Anxiety Score 08/23/2019  Nervous, Anxious, on Edge 1  Control/stop worrying 0  Worry too much - different things 0  Trouble relaxing 0  Restless 0  Easily annoyed or irritable 0  Afraid -  awful might happen 0  Total GAD 7 Score 1     Wt Readings from Last 3 Encounters:  08/23/19 214 lb (97.1 kg)  02/05/19 244 lb 12.8 oz (111 kg)  01/21/19 244 lb (110.7 kg)   Observations/Objective: Physical Exam  Constitutional: She is oriented to person, place, and time. No distress.  Pulmonary/Chest: Effort normal.  Neurological: She is alert and oriented to person, place, and time.  Psychiatric: She has a normal mood and affect. Her behavior is normal. Thought content normal.  Vitals reviewed.  Assessment and Plan: Taleyah was seen today for follow-up.  Diagnoses and all orders for this visit:  Situational anxiety -     ALPRAZolam (XANAX) 0.25 MG tablet; Take 1 tablet (0.25 mg total) by mouth daily as needed for anxiety.  Class 3 severe obesity due to excess calories with serious comorbidity and body mass index (BMI) of 40.0 to 44.9 in adult (HCC) -     phentermine 15 MG capsule; Take 1 capsule (15 mg total) by mouth every other day.   Follow Up Instructions: Rx Alprazolam 0.25mg , #14tabs provided, she is aware this will not be refilled Advised to consider use of effexor or buspar.  Advised about potential increase irritability and anxiety with phentermine. Encourage to continue weight loss program and daily exercise  Rx phentermine 15mg  EOD sent, #30 F/up in 42month  I discussed the assessment and treatment plan with the patient. The patient was provided an opportunity to ask questions and all were answered. The patient agreed  with the plan and demonstrated an understanding of the instructions.   The patient was advised to call back or seek an in-person evaluation if the symptoms worsen or if the condition fails to improve as anticipated.   Wilfred Lacy, NP

## 2019-08-23 NOTE — Assessment & Plan Note (Addendum)
Reports 10lbs weight loss with online weight loss program in last 8wks Will like to use phentermine.Last filled 08/03/2018, 06/03/2018, 05/11/2018. No longer breast feeding. Wt Readings from Last 3 Encounters:  08/23/19 214 lb (97.1 kg)  02/05/19 244 lb 12.8 oz (111 kg)  01/21/19 244 lb (110.7 kg)   Advised about potential increase irritability and anxiety with phentermine. Encourage to continue weight loss program and daily exercise  Rx phentermine 15mg  EOD sent, #30 F/up in 98month

## 2019-09-16 DIAGNOSIS — T8332XA Displacement of intrauterine contraceptive device, initial encounter: Secondary | ICD-10-CM | POA: Diagnosis not present

## 2019-09-16 DIAGNOSIS — Z30011 Encounter for initial prescription of contraceptive pills: Secondary | ICD-10-CM | POA: Diagnosis not present

## 2019-09-23 ENCOUNTER — Encounter: Payer: Self-pay | Admitting: Nurse Practitioner

## 2019-10-01 ENCOUNTER — Ambulatory Visit: Payer: Federal, State, Local not specified - PPO | Admitting: Nurse Practitioner

## 2019-10-01 ENCOUNTER — Other Ambulatory Visit: Payer: Self-pay

## 2019-10-02 ENCOUNTER — Ambulatory Visit (INDEPENDENT_AMBULATORY_CARE_PROVIDER_SITE_OTHER): Payer: Federal, State, Local not specified - PPO | Admitting: Nurse Practitioner

## 2019-10-02 ENCOUNTER — Encounter: Payer: Self-pay | Admitting: Nurse Practitioner

## 2019-10-02 VITALS — BP 128/68 | HR 94 | Temp 97.3°F | Ht 61.0 in | Wt 209.2 lb

## 2019-10-02 DIAGNOSIS — Z6839 Body mass index (BMI) 39.0-39.9, adult: Secondary | ICD-10-CM

## 2019-10-02 DIAGNOSIS — Z7689 Persons encountering health services in other specified circumstances: Secondary | ICD-10-CM

## 2019-10-02 MED ORDER — PHENTERMINE HCL 37.5 MG PO TABS: 37.5000 mg | ORAL_TABLET | Freq: Every day | ORAL | 1 refills | Status: DC

## 2019-10-02 NOTE — Patient Instructions (Signed)
Phentermine increased to 37.5mg  You can take 0.5 to 1tab daily as tolerated Continue the great work.

## 2019-10-02 NOTE — Progress Notes (Signed)
Subjective:  Patient ID: Kiara Hall, female    DOB: 08-20-1980  Age: 39 y.o. MRN: [id number]  CC: Weight Loss (pt concerns about Yaz the BC-IUD is out//feels like weight is doing good)  HPI Weight loss management: BMI of 39 Lost 5lbs in last 58month with phentermine 15mg . She denies any adverse side effects. She continues daily exercise (cardio and weight training 32mins per day) and portion control. Wt Readings from Last 3 Encounters:  10/02/19 209 lb 3.2 oz (94.9 kg)  08/23/19 214 lb (97.1 kg)  02/05/19 244 lb 12.8 oz (111 kg)   Wt Readings from Last 3 Encounters:  10/02/19 209 lb 3.2 oz (94.9 kg)  08/23/19 214 lb (97.1 kg)  02/05/19 244 lb 12.8 oz (111 kg)   Reviewed past Medical, Social and Family history today.  Outpatient Medications Prior to Visit  Medication Sig Dispense Refill  . acetaminophen (TYLENOL) 325 MG tablet Take 2 tablets (650 mg total) by mouth every 4 (four) hours as needed for mild pain (temperature > 101.5.).    Marland Kitchen ALPRAZolam (XANAX) 0.25 MG tablet Take 1 tablet (0.25 mg total) by mouth daily as needed for anxiety. 14 tablet 0  . Fluticasone Propionate (FLONASE ALLERGY RELIEF NA) Place into the nose.    . ibuprofen (ADVIL) 800 MG tablet Take 1 tablet (800 mg total) by mouth every 8 (eight) hours as needed. 30 tablet 1  . Loratadine (CLARITIN PO) Take by mouth.    . phentermine 15 MG capsule Take 1 capsule (15 mg total) by mouth every other day. 30 capsule 0  . drospirenone-ethinyl estradiol (YAZ) 3-0.02 MG tablet Take 1 tablet by mouth daily.    . IUD'S IU by Intrauterine route.     No facility-administered medications prior to visit.    ROS See HPI  Objective:  BP 128/68   Pulse 94   Temp (!) 97.3 F (36.3 C) (Tympanic)   Ht 5\' 1"  (1.549 m)   Wt 209 lb 3.2 oz (94.9 kg)   SpO2 97%   BMI 39.53 kg/m   BP Readings from Last 3 Encounters:  10/02/19 128/68  02/08/19 123/74  01/21/19 123/66    Wt Readings from Last 3 Encounters:  10/02/19  209 lb 3.2 oz (94.9 kg)  08/23/19 214 lb (97.1 kg)  02/05/19 244 lb 12.8 oz (111 kg)    Physical Exam Vitals reviewed.  Constitutional:      Appearance: She is obese.  Cardiovascular:     Rate and Rhythm: Normal rate and regular rhythm.     Pulses: Normal pulses.     Heart sounds: Normal heart sounds.  Pulmonary:     Effort: Pulmonary effort is normal.     Breath sounds: Normal breath sounds.  Musculoskeletal:     Right lower leg: No edema.     Left lower leg: No edema.  Neurological:     Mental Status: She is alert and oriented to person, place, and time.  Psychiatric:        Mood and Affect: Mood normal.        Behavior: Behavior normal.        Thought Content: Thought content normal.     Lab Results  Component Value Date   WBC 7.9 02/06/2019   HGB 9.7 (L) 02/06/2019   HCT 29.9 (L) 02/06/2019   PLT 127 (L) 02/06/2019   GLUCOSE 86 10/31/2017   CHOL 186 10/31/2017   TRIG 37.0 10/31/2017   HDL 85.00 10/31/2017  LDLCALC 93 10/31/2017   ALT 10 10/31/2017   AST 15 10/31/2017   NA 135 10/31/2017   K 4.3 10/31/2017   CL 102 10/31/2017   CREATININE 0.56 10/31/2017   BUN 12 10/31/2017   CO2 26 10/31/2017   TSH 1.61 10/31/2017   HGBA1C 5.4 10/31/2017    No results found.  Assessment & Plan:  This visit occurred during the SARS-CoV-2 public health emergency.  Safety protocols were in place, including screening questions prior to the visit, additional usage of staff PPE, and extensive cleaning of exam room while observing appropriate contact time as indicated for disinfecting solutions.   Emillie was seen today for weight loss.  Diagnoses and all orders for this visit:  Class 2 severe obesity due to excess calories with serious comorbidity and body mass index (BMI) of 39.0 to 39.9 in adult (HCC) -     phentermine (ADIPEX-P) 37.5 MG tablet; Take 1 tablet (37.5 mg total) by mouth daily before breakfast.  Encounter for weight management -     phentermine (ADIPEX-P)  37.5 MG tablet; Take 1 tablet (37.5 mg total) by mouth daily before breakfast.   I have discontinued Aibhlinn P. Brimmer's phentermine. I am also having her start on phentermine. Additionally, I am having her maintain her acetaminophen, ibuprofen, Fluticasone Propionate (FLONASE ALLERGY RELIEF NA), Loratadine (CLARITIN PO), IUD'S IU, ALPRAZolam, and drospirenone-ethinyl estradiol.  Meds ordered this encounter  Medications  . phentermine (ADIPEX-P) 37.5 MG tablet    Sig: Take 1 tablet (37.5 mg total) by mouth daily before breakfast.    Dispense:  30 tablet    Refill:  1    Order Specific Question:   Supervising Provider    Answer:   Overton Mam [[id number]]    Problem List Items Addressed This Visit      Other   Obesity - Primary   Relevant Medications   phentermine (ADIPEX-P) 37.5 MG tablet    Other Visit Diagnoses    Encounter for weight management       Relevant Medications   phentermine (ADIPEX-P) 37.5 MG tablet      Follow-up: Return in about 4 weeks (around 10/30/2019) for weight management (video).  Alysia Penna, NP

## 2019-10-18 DIAGNOSIS — Z20822 Contact with and (suspected) exposure to covid-19: Secondary | ICD-10-CM | POA: Diagnosis not present

## 2019-10-18 DIAGNOSIS — Z03818 Encounter for observation for suspected exposure to other biological agents ruled out: Secondary | ICD-10-CM | POA: Diagnosis not present

## 2019-11-29 ENCOUNTER — Encounter: Payer: Self-pay | Admitting: Nurse Practitioner

## 2019-11-29 ENCOUNTER — Telehealth (INDEPENDENT_AMBULATORY_CARE_PROVIDER_SITE_OTHER): Payer: Federal, State, Local not specified - PPO | Admitting: Nurse Practitioner

## 2019-11-29 VITALS — Ht 61.0 in | Wt 195.0 lb

## 2019-11-29 DIAGNOSIS — Z6839 Body mass index (BMI) 39.0-39.9, adult: Secondary | ICD-10-CM | POA: Diagnosis not present

## 2019-11-29 DIAGNOSIS — Z7689 Persons encountering health services in other specified circumstances: Secondary | ICD-10-CM | POA: Diagnosis not present

## 2019-11-29 MED ORDER — PHENTERMINE HCL 37.5 MG PO TABS: 37.5000 mg | ORAL_TABLET | Freq: Every day | ORAL | 1 refills | Status: DC

## 2019-11-29 NOTE — Assessment & Plan Note (Signed)
BMI of 36.84 Lost total of 19lbs since 08/2019 Lost 14Lbs in last 4weeks. She has maintain phentermine, low carb/lofat diet, small potions, and daily exercise. Denies any adverse effects with phentermine. Wt Readings from Last 3 Encounters:  11/29/19 195 lb (88.5 kg)  10/02/19 209 lb 3.2 oz (94.9 kg)  08/23/19 214 lb (97.1 kg)   Continue current medication and lifestyle modifications. Phentermine refill sent F/up in 64montha

## 2019-11-29 NOTE — Progress Notes (Signed)
Virtual Visit via Video Note  I connected with@ on 11/29/19 at  9:00 AM EDT by a video enabled telemedicine application and verified that I am speaking with the correct person using two identifiers.  Location: Patient:Home Provider: Office Participants: patient and provider   I discussed the limitations of evaluation and management by telemedicine and the availability of in person appointments. I also discussed with the patient that there may be a patient responsible charge related to this service. The patient expressed understanding and agreed to proceed.  GY:[facility] management  History of Present Illness: Obesity: BMI of 36.84 Lost total of 19lbs since 08/2019 Lost 14Lbs in last 4weeks. She has maintain phentermine, low carb/lofat diet, small potions, and daily exercise. Denies any adverse effects with phentermine. Wt Readings from Last 3 Encounters:  11/29/19 195 lb (88.5 kg)  10/02/19 209 lb 3.2 oz (94.9 kg)  08/23/19 214 lb (97.1 kg)   Observations/Objective: Physical Exam Vitals reviewed.  Cardiovascular:     Rate and Rhythm: Normal rate.     Pulses: Normal pulses.  Neurological:     Mental Status: She is alert and oriented to person, place, and time.  Psychiatric:        Mood and Affect: Mood normal.        Behavior: Behavior normal.        Thought Content: Thought content normal.    Assessment and Plan: Keyla was seen today for follow-up.  Diagnoses and all orders for this visit:  Class 2 severe obesity due to excess calories with serious comorbidity and body mass index (BMI) of 39.0 to 39.9 in adult (HCC) -     phentermine (ADIPEX-P) 37.5 MG tablet; Take 1 tablet (37.5 mg total) by mouth daily before breakfast.  Encounter for weight management -     phentermine (ADIPEX-P) 37.5 MG tablet; Take 1 tablet (37.5 mg total) by mouth daily before breakfast.    Follow Up Instructions: Obesity BMI of 36.84 Lost total of 19lbs since 08/2019 Lost 14Lbs in last  4weeks. She has maintain phentermine, low carb/lofat diet, small potions, and daily exercise. Denies any adverse effects with phentermine. Wt Readings from Last 3 Encounters:  11/29/19 195 lb (88.5 kg)  10/02/19 209 lb 3.2 oz (94.9 kg)  08/23/19 214 lb (97.1 kg)   Continue current medication and lifestyle modifications. Phentermine refill sent F/up in 41montha  I discussed the assessment and treatment plan with the patient. The patient was provided an opportunity to ask questions and all were answered. The patient agreed with the plan and demonstrated an understanding of the instructions.   The patient was advised to call back or seek an in-person evaluation if the symptoms worsen or if the condition fails to improve as anticipated.  Vassie Loll, NP

## 2020-01-02 DIAGNOSIS — Z20828 Contact with and (suspected) exposure to other viral communicable diseases: Secondary | ICD-10-CM | POA: Diagnosis not present

## 2020-01-10 DIAGNOSIS — Z20822 Contact with and (suspected) exposure to covid-19: Secondary | ICD-10-CM | POA: Diagnosis not present

## 2020-01-21 DIAGNOSIS — Z20822 Contact with and (suspected) exposure to covid-19: Secondary | ICD-10-CM | POA: Diagnosis not present

## 2020-05-19 ENCOUNTER — Encounter: Payer: Self-pay | Admitting: Nurse Practitioner

## 2020-05-28 ENCOUNTER — Encounter: Payer: Self-pay | Admitting: Nurse Practitioner

## 2020-05-28 DIAGNOSIS — R059 Cough, unspecified: Secondary | ICD-10-CM

## 2020-05-28 MED ORDER — BENZONATATE 100 MG PO CAPS: 100.0000 mg | ORAL_CAPSULE | Freq: Three times a day (TID) | ORAL | 0 refills | Status: DC | PRN

## 2020-06-06 DIAGNOSIS — Z20822 Contact with and (suspected) exposure to covid-19: Secondary | ICD-10-CM | POA: Diagnosis not present

## 2020-06-09 ENCOUNTER — Other Ambulatory Visit: Payer: Self-pay

## 2020-06-10 ENCOUNTER — Encounter: Payer: Self-pay | Admitting: Nurse Practitioner

## 2020-06-10 ENCOUNTER — Ambulatory Visit: Payer: Federal, State, Local not specified - PPO | Admitting: Nurse Practitioner

## 2020-06-10 VITALS — BP 136/80 | HR 98 | Temp 98.0°F | Ht 61.0 in | Wt 208.6 lb

## 2020-06-10 DIAGNOSIS — Z13220 Encounter for screening for lipoid disorders: Secondary | ICD-10-CM

## 2020-06-10 DIAGNOSIS — Z7689 Persons encountering health services in other specified circumstances: Secondary | ICD-10-CM

## 2020-06-10 DIAGNOSIS — Z136 Encounter for screening for cardiovascular disorders: Secondary | ICD-10-CM

## 2020-06-10 DIAGNOSIS — Z6839 Body mass index (BMI) 39.0-39.9, adult: Secondary | ICD-10-CM

## 2020-06-10 LAB — HEPATIC FUNCTION PANEL
ALT: 8 U/L (ref 0–35)
AST: 13 U/L (ref 0–37)
Albumin: 4.1 g/dL (ref 3.5–5.2)
Alkaline Phosphatase: 47 U/L (ref 39–117)
Bilirubin, Direct: 0.1 mg/dL (ref 0.0–0.3)
Total Bilirubin: 0.5 mg/dL (ref 0.2–1.2)
Total Protein: 7 g/dL (ref 6.0–8.3)

## 2020-06-10 LAB — BASIC METABOLIC PANEL
BUN: 10 mg/dL (ref 6–23)
CO2: 28 mEq/L (ref 19–32)
Calcium: 9.5 mg/dL (ref 8.4–10.5)
Chloride: 104 mEq/L (ref 96–112)
Creatinine, Ser: 0.68 mg/dL (ref 0.40–1.20)
GFR: 110.03 mL/min (ref >60.00)
Glucose, Bld: 90 mg/dL (ref 70–99)
Potassium: 5 mEq/L (ref 3.5–5.1)
Sodium: 140 mEq/L (ref 135–145)

## 2020-06-10 LAB — LIPID PANEL
Cholesterol: 168 mg/dL (ref 0–200)
HDL: 76 mg/dL (ref >39.00)
LDL Cholesterol: 84 mg/dL (ref 0–99)
NonHDL: 91.55
Total CHOL/HDL Ratio: 2
Triglycerides: 39 mg/dL (ref 0.0–149.0)
VLDL: 7.8 mg/dL (ref 0.0–40.0)

## 2020-06-10 LAB — HEMOGLOBIN A1C: Hgb A1c MFr Bld: 5.7 % (ref 4.6–6.5)

## 2020-06-10 LAB — TSH: TSH: 1.51 u[IU]/mL (ref 0.35–4.50)

## 2020-06-10 LAB — HCG, QUANTITATIVE, PREGNANCY: Quantitative HCG: <0.6 m[IU]/mL

## 2020-06-10 MED ORDER — PHENTERMINE HCL 37.5 MG PO TABS: 37.5000 mg | ORAL_TABLET | Freq: Every day | ORAL | 0 refills | Status: DC

## 2020-06-10 NOTE — Patient Instructions (Signed)
Go to lab for blood draw Will send phentermine rx after review of lab results. Only 30tabs with no refill. Let me know what you decide about Saxenda injection.  Liraglutide injection (Weight Management) What is this medicine? LIRAGLUTIDE (LIR a GLOO tide) is used to help people lose weight and maintain weight loss. It is used with a reduced-calorie diet and exercise. This medicine may be used for other purposes; ask your health care provider or pharmacist if you have questions. COMMON BRAND NAME(S): Saxenda What should I tell my health care provider before I take this medicine? They need to know if you have any of these conditions:  endocrine tumors (MEN 2) or if someone in your family had these tumors  gallbladder disease  high cholesterol  history of alcohol abuse problem  history of pancreatitis  kidney disease or if you are on dialysis  liver disease  previous swelling of the tongue, face, or lips with difficulty breathing, difficulty swallowing, hoarseness, or tightening of the throat  stomach problems  suicidal thoughts, plans, or attempt; a previous suicide attempt by you or a family member  thyroid cancer or if someone in your family had thyroid cancer  an unusual or allergic reaction to liraglutide, other medicines, foods, dyes, or preservatives  pregnant or trying to get pregnant  breast-feeding How should I use this medicine? This medicine is for injection under the skin of your upper leg, stomach area, or upper arm. You will be taught how to prepare and give this medicine. Use exactly as directed. Take your medicine at regular intervals. Do not take it more often than directed. This medicine comes with INSTRUCTIONS FOR USE. Ask your pharmacist for directions on how to use this medicine. Read the information carefully. Talk to your pharmacist or health care provider if you have questions. It is important that you put your used needles and syringes in a special  sharps container. Do not put them in a trash can. If you do not have a sharps container, call your pharmacist or healthcare provider to get one. A special MedGuide will be given to you by the pharmacist with each prescription and refill. Be sure to read this information carefully each time. Talk to your health care provider about the use of this medicine in children. While it may be prescribed for children as young as 45 years of age for selected conditions, precautions do apply. Overdosage: If you think you have taken too much of this medicine contact a poison control center or emergency room at once. NOTE: This medicine is only for you. Do not share this medicine with others. What if I miss a dose? If you miss a dose, take it as soon as you can. If it is almost time for your next dose, take only that dose. Do not take double or extra doses. If you miss your dose for 3 days or more, call your doctor or health care professional to talk about how to restart this medicine. What may interact with this medicine?  insulin and other medicines for diabetes This list may not describe all possible interactions. Give your health care provider a list of all the medicines, herbs, non-prescription drugs, or dietary supplements you use. Also tell them if you smoke, drink alcohol, or use illegal drugs. Some items may interact with your medicine. What should I watch for while using this medicine? Visit your doctor or health care professional for regular checks on your progress. Drink plenty of fluids while taking this  medicine. Check with your doctor or health care professional if you get an attack of severe diarrhea, nausea, and vomiting. The loss of too much body fluid can make it dangerous for you to take this medicine. This medicine may affect blood sugar levels. Ask your healthcare provider if changes in diet or medicines are needed if you have diabetes. Patients and their families should watch out for worsening  depression or thoughts of suicide. Also watch out for sudden changes in feelings such as feeling anxious, agitated, panicky, irritable, hostile, aggressive, impulsive, severely restless, overly excited and hyperactive, or not being able to sleep. If this happens, especially at the beginning of treatment or after a change in dose, call your health care professional. Women should inform their health care provider if they wish to become pregnant or think they might be pregnant. Losing weight while pregnant is not advised and may cause harm to the unborn child. Talk to your health care provider for more information. What side effects may I notice from receiving this medicine? Side effects that you should report to your doctor or health care professional as soon as possible:  allergic reactions like skin rash, itching or hives, swelling of the face, lips, or tongue  breathing problems  diarrhea that continues or is severe  lump or swelling on the neck  severe nausea  signs and symptoms of infection like fever or chills; cough; sore throat; pain or trouble passing urine  signs and symptoms of low blood sugar such as feeling anxious; confusion; dizziness; increased hunger; unusually weak or tired; increased sweating; shakiness; cold, clammy skin; irritable; headache; blurred vision; fast heartbeat; loss of consciousness  signs and symptoms of kidney injury like trouble passing urine or change in the amount of urine  trouble swallowing  unusual stomach upset or pain  vomiting Side effects that usually do not require medical attention (report to your doctor or health care professional if they continue or are bothersome):  constipation  decreased appetite  diarrhea  fatigue  headache  nausea  pain, redness, or irritation at site where injected  stomach upset  stuffy or runny nose This list may not describe all possible side effects. Call your doctor for medical advice about side  effects. You may report side effects to FDA at 1-800-FDA-1088. Where should I keep my medicine? Keep out of the reach of children. Store unopened pen in a refrigerator between 2 and 8 degrees C (36 and 46 degrees F). Do not freeze or use if the medicine has been frozen. Protect from light and excessive heat. After you first use the pen, it can be stored at room temperature between 15 and 30 degrees C (59 and 86 degrees F) or in a refrigerator. Throw away your used pen after 30 days or after the expiration date, whichever comes first. Do not store your pen with the needle attached. If the needle is left on, medicine may leak from the pen. NOTE: This sheet is a summary. It may not cover all possible information. If you have questions about this medicine, talk to your doctor, pharmacist, or health care provider.  2021 Elsevier/Gold Standard (2019-04-11 13:56:25)

## 2020-06-10 NOTE — Assessment & Plan Note (Signed)
She admits to diet indiscretion which led to gaining all weight lost last year. Exercise: cardio and weight training daily. We discussed pros and cons of phentermine vs saxenda injection. Advised the importance of maintain small portions and making healthy food choices. She does not want referral to weight management clinic because she is aware of changes that need to be made. We decided to start with phentermine for 30days, then stop She will get back to me about use of saxenda or Ozempic  Injection? Wt Readings from Last 3 Encounters:  06/10/20 208 lb 9.6 oz (94.6 kg)  11/29/19 195 lb (88.5 kg)  10/02/19 209 lb 3.2 oz (94.9 kg)   BP Readings from Last 3 Encounters:  06/10/20 136/80  10/02/19 128/68  02/08/19 123/74   Check BMP, TSH, HgbA1c, and lipid panel Negative HCG Phentermine 37.5mg  sent F/up in 3months

## 2020-06-10 NOTE — Progress Notes (Signed)
Subjective:  Patient ID: Kiara Hall, female    DOB: 1980/11/06  Age: 40 y.o. MRN: [id number]  CC: Follow-up (Pt would like to discuss weight)  HPI  Obesity She admits to diet indiscretion which led to gaining all weight lost last year. Exercise: cardio and weight training daily. We discussed pros and cons of phentermine vs saxenda injection. Advised the importance of maintain small portions and making healthy food choices. She does not want referral to weight management clinic because she is aware of changes that need to be made. We decided to start with phentermine for 30days, then stop She will get back to me about use of saxenda or Ozempic  Injection? Wt Readings from Last 3 Encounters:  06/10/20 208 lb 9.6 oz (94.6 kg)  11/29/19 195 lb (88.5 kg)  10/02/19 209 lb 3.2 oz (94.9 kg)   BP Readings from Last 3 Encounters:  06/10/20 136/80  10/02/19 128/68  02/08/19 123/74   Check BMP, TSH, HgbA1c, and lipid panel Negative HCG Phentermine 37.5mg  sent F/up in 52months   Wt Readings from Last 3 Encounters:  06/10/20 208 lb 9.6 oz (94.6 kg)  11/29/19 195 lb (88.5 kg)  10/02/19 209 lb 3.2 oz (94.9 kg)   BP Readings from Last 3 Encounters:  06/10/20 136/80  10/02/19 128/68  02/08/19 123/74   Reviewed past Medical, Social and Family history today.  Outpatient Medications Prior to Visit  Medication Sig Dispense Refill  . acetaminophen (TYLENOL) 325 MG tablet Take 2 tablets (650 mg total) by mouth every 4 (four) hours as needed for mild pain (temperature > 101.5.).    Marland Kitchen Fluticasone Propionate (FLONASE ALLERGY RELIEF NA) Place into the nose.    . ibuprofen (ADVIL) 800 MG tablet Take 1 tablet (800 mg total) by mouth every 8 (eight) hours as needed. 30 tablet 1  . Loratadine (CLARITIN PO) Take by mouth.    . phentermine (ADIPEX-P) 37.5 MG tablet Take 1 tablet (37.5 mg total) by mouth daily before breakfast. 30 tablet 1  . benzonatate (TESSALON) 100 MG capsule Take 1-2  capsules (100-200 mg total) by mouth 3 (three) times daily as needed. (Patient not taking: Reported on 06/10/2020) 21 capsule 0   No facility-administered medications prior to visit.    ROS See HPI  Objective:  BP 136/80 (BP Location: Left Arm, Patient Position: Sitting, Cuff Size: Normal)   Pulse 98   Temp 98 F (36.7 C) (Temporal)   Ht 5\' 1"  (1.549 m)   Wt 208 lb 9.6 oz (94.6 kg)   LMP 05/17/2020   SpO2 99%   Breastfeeding No Comment: condoms  BMI 39.41 kg/m   Physical Exam Vitals reviewed.  Constitutional:      Appearance: She is obese.  Cardiovascular:     Rate and Rhythm: Normal rate.     Pulses: Normal pulses.  Pulmonary:     Effort: Pulmonary effort is normal.  Musculoskeletal:     Right lower leg: No edema.     Left lower leg: No edema.  Neurological:     Mental Status: She is alert and oriented to person, place, and time.  Psychiatric:        Mood and Affect: Mood normal.        Behavior: Behavior normal.        Thought Content: Thought content normal.    Assessment & Plan:  This visit occurred during the SARS-CoV-2 public health emergency.  Safety protocols were in place, including screening questions prior to  the visit, additional usage of staff PPE, and extensive cleaning of exam room while observing appropriate contact time as indicated for disinfecting solutions.   Kiara Hall was seen today for follow-up.  Diagnoses and all orders for this visit:  Class 2 severe obesity due to excess calories with serious comorbidity and body mass index (BMI) of 39.0 to 39.9 in adult (HCC) -     TSH -     Basic metabolic panel -     Hepatic function panel -     Hemoglobin A1c -     B-HCG Quant -     Lipid panel -     phentermine (ADIPEX-P) 37.5 MG tablet; Take 1 tablet (37.5 mg total) by mouth daily before breakfast.  Encounter for weight management -     B-HCG Quant -     phentermine (ADIPEX-P) 37.5 MG tablet; Take 1 tablet (37.5 mg total) by mouth daily before  breakfast.  Encounter for lipid screening for cardiovascular disease -     Lipid panel   Problem List Items Addressed This Visit      Other   Obesity - Primary    She admits to diet indiscretion which led to gaining all weight lost last year. Exercise: cardio and weight training daily. We discussed pros and cons of phentermine vs saxenda injection. Advised the importance of maintain small portions and making healthy food choices. She does not want referral to weight management clinic because she is aware of changes that need to be made. We decided to start with phentermine for 30days, then stop She will get back to me about use of saxenda or Ozempic  Injection? Wt Readings from Last 3 Encounters:  06/10/20 208 lb 9.6 oz (94.6 kg)  11/29/19 195 lb (88.5 kg)  10/02/19 209 lb 3.2 oz (94.9 kg)   BP Readings from Last 3 Encounters:  06/10/20 136/80  10/02/19 128/68  02/08/19 123/74   Check BMP, TSH, HgbA1c, and lipid panel Negative HCG Phentermine 37.5mg  sent F/up in 21months       Relevant Medications   phentermine (ADIPEX-P) 37.5 MG tablet   Other Relevant Orders   TSH (Completed)   Basic metabolic panel (Completed)   Hepatic function panel (Completed)   Hemoglobin A1c (Completed)   B-HCG Quant (Completed)   Lipid panel (Completed)    Other Visit Diagnoses    Encounter for weight management       Relevant Medications   phentermine (ADIPEX-P) 37.5 MG tablet   Other Relevant Orders   B-HCG Quant (Completed)   Encounter for lipid screening for cardiovascular disease       Relevant Orders   Lipid panel (Completed)      I have spent with this patient regarding history taking, documentation, formulating plan and discussing treatment options with patient.  Follow-up: Return in about 3 months (around 09/07/2020) for Weight management.  Alysia Penna, NP

## 2020-06-15 ENCOUNTER — Encounter: Payer: Self-pay | Admitting: Nurse Practitioner

## 2020-06-15 DIAGNOSIS — Z7689 Persons encountering health services in other specified circumstances: Secondary | ICD-10-CM

## 2020-06-19 ENCOUNTER — Telehealth: Payer: Self-pay

## 2020-06-19 DIAGNOSIS — Z6839 Body mass index (BMI) 39.0-39.9, adult: Secondary | ICD-10-CM

## 2020-06-19 DIAGNOSIS — Z7689 Persons encountering health services in other specified circumstances: Secondary | ICD-10-CM

## 2020-06-19 MED ORDER — OZEMPIC (0.25 OR 0.5 MG/DOSE) 2 MG/1.5ML ~~LOC~~ SOPN: PEN_INJECTOR | SUBCUTANEOUS | 0 refills | Status: DC

## 2020-06-19 NOTE — Telephone Encounter (Signed)
Pharmacy requesting clarification on ozempic medication.

## 2020-07-07 DIAGNOSIS — J069 Acute upper respiratory infection, unspecified: Secondary | ICD-10-CM | POA: Diagnosis not present

## 2020-07-07 DIAGNOSIS — R0981 Nasal congestion: Secondary | ICD-10-CM | POA: Diagnosis not present

## 2020-07-07 DIAGNOSIS — Z20822 Contact with and (suspected) exposure to covid-19: Secondary | ICD-10-CM | POA: Diagnosis not present

## 2020-07-07 DIAGNOSIS — R059 Cough, unspecified: Secondary | ICD-10-CM | POA: Diagnosis not present

## 2020-07-14 ENCOUNTER — Ambulatory Visit: Payer: Federal, State, Local not specified - PPO | Admitting: Nurse Practitioner

## 2020-07-14 ENCOUNTER — Other Ambulatory Visit: Payer: Self-pay

## 2020-07-14 ENCOUNTER — Encounter: Payer: Self-pay | Admitting: Nurse Practitioner

## 2020-07-14 VITALS — BP 140/84 | HR 102 | Temp 98.2°F | Wt 202.2 lb

## 2020-07-14 DIAGNOSIS — F53 Postpartum depression: Secondary | ICD-10-CM | POA: Insufficient documentation

## 2020-07-14 DIAGNOSIS — Z6838 Body mass index (BMI) 38.0-38.9, adult: Secondary | ICD-10-CM

## 2020-07-14 DIAGNOSIS — M79604 Pain in right leg: Secondary | ICD-10-CM | POA: Diagnosis not present

## 2020-07-14 DIAGNOSIS — F4322 Adjustment disorder with anxiety: Secondary | ICD-10-CM

## 2020-07-14 DIAGNOSIS — F418 Other specified anxiety disorders: Secondary | ICD-10-CM | POA: Insufficient documentation

## 2020-07-14 DIAGNOSIS — F411 Generalized anxiety disorder: Secondary | ICD-10-CM | POA: Insufficient documentation

## 2020-07-14 NOTE — Patient Instructions (Addendum)
D/c ozempic You will be contacted to schedule an appt for venous doppler  Use [email or web address] to find therapist of your choice.  Modified exercise regimen to low impact Stretch before and after exercise. Ok to use tylenol 500mg  or ibuprofen 400-600mg  every 8hrs as needed for pain.  Muscle Strain A muscle strain is an injury that occurs when a muscle is stretched beyond its normal length. Usually, a small number of muscle fibers are torn when this happens. There are three types of muscle strains. First-degree strains have the least amount of muscle fiber tearing and the least amount of pain. Second-degree and third-degree strains have more tearing and pain. Usually, recovery from muscle strain takes 1-2 weeks. Complete healing normally takes 5-6 weeks. What are the causes? This condition is caused when a sudden, violent force is placed on a muscle and stretches it too far. This may occur with a fall, lifting, or sports. What increases the risk? This condition is more likely to develop in athletes and people who are physically active. What are the signs or symptoms? Symptoms of this condition include:  Pain.  Bruising.  Swelling.  Trouble using the muscle. How is this diagnosed? This condition is diagnosed based on a physical exam and your medical history. Tests may also be done, including an X-ray, ultrasound, or MRI. How is this treated? This condition is initially treated with PRICE therapy. This therapy involves:  Protecting the muscle from being injured again.  Resting the injured muscle.  Icing the injured muscle.  Applying pressure (compression) to the injured muscle. This may be done with a splint or elastic bandage.  Raising (elevating) the injured muscle. Your health care provider may also recommend medicine for pain. Follow these instructions at home: If you have a splint:  Wear the splint as told by your health care provider. Remove it only as told by your  health care provider.  Loosen the splint if your fingers or toes tingle, become numb, or turn cold and blue.  Keep the splint clean.  If the splint is not waterproof: ? Do not let it get wet. ? Cover it with a watertight covering when you take a bath or a shower. Managing pain, stiffness, and swelling  If directed, put ice on the injured area: ? If you have a removable splint, remove it as told by your health care provider. ? Put ice in a plastic bag. ? Place a towel between your skin and the bag. ? Leave the ice on for 20 minutes, 2-3 times a day.  Move your fingers or toes often to avoid stiffness and to lessen swelling.  Raise (elevate) the injured area above the level of your heart while you are sitting or lying down.  Wear an elastic bandage as told by your health care provider. Make sure that it is not too tight.   General instructions  Treatment may include medicines for pain and inflammation that are taken by mouth or applied to the skin, prescription pain medicine, or muscle relaxants. Take over-the-counter and prescription medicines only as told by your health care provider.  Restrict your activity and rest the injured muscle as told by your health care provider. Gentle movements may be allowed.  If physical therapy was prescribed, do exercises as told by your health care provider.  Do not put pressure on any part of the splint until it is fully hardened. This may take several hours.  Do not use any products that contain nicotine or tobacco, such  as cigarettes and e-cigarettes. These can delay bone healing. If you need help quitting, ask your health care provider.  Ask your health care provider when it is safe to drive if you have a splint.  Keep all follow-up visits as told by your health care provider. This is important. How is this prevented?  Warm up before exercising. This helps to prevent future muscle strains. Contact a health care provider if:  You have  more pain or swelling in the injured area. Get help right away if:  You have numbness or tingling or lose a lot of strength in the injured area. Summary  A muscle strain is an injury that occurs when a muscle is stretched beyond its normal length.  This condition is caused when a sudden, violent force is placed on a muscle and stretches it too far.  This condition is initially treated with PRICE therapy, which involves protecting, resting, icing, compressing, and elevating.  Gentle movements may be allowed. If physical therapy was prescribed, do exercises as told by your health care provider. This information is not intended to replace advice given to you by your health care provider. Make sure you discuss any questions you have with your health care provider. Document Revised: 01/10/2020 Document Reviewed: 01/10/2020 Elsevier Patient Education  2021 [employer].

## 2020-07-14 NOTE — Assessment & Plan Note (Signed)
Unable to tolerate ozempic injection at 0.25mg  weekly D/c medication

## 2020-07-14 NOTE — Assessment & Plan Note (Signed)
Anxious about overall health. Plans to schedule an appt with therapist of her choice

## 2020-07-14 NOTE — Progress Notes (Signed)
Subjective:  Patient ID: Kiara Hall, female    DOB: 1980-05-30  Age: 40 y.o. MRN: [id number]  CC: Follow-up (Pt c/o right leg pain and numbness which she thinks is related to her ozempic medication. )  Leg Pain  The incident occurred 5 to 7 days ago. The incident occurred at home. The injury mechanism was a twisting injury. The pain is present in the right thigh and right leg. The quality of the pain is described as aching and cramping. The pain is moderate. The pain has been intermittent since onset. Pertinent negatives include no inability to bear weight, loss of motion, loss of sensation, muscle weakness, numbness or tingling. She reports no foreign bodies present. The symptoms are aggravated by movement. She has tried acetaminophen for the symptoms. The treatment provided mild relief.  onset on right leg pain after high impact video exercise : squats and jumping jack. No stretching before nor after exercise.  Obesity Unable to tolerate ozempic injection at 0.25mg  weekly D/c medication  Adjustment disorder with anxious mood Anxious about overall health. Plans to schedule an appt with therapist of her choice  Reviewed past Medical, Social and Family history today.  Outpatient Medications Prior to Visit  Medication Sig Dispense Refill  . acetaminophen (TYLENOL) 325 MG tablet Take 2 tablets (650 mg total) by mouth every 4 (four) hours as needed for mild pain (temperature > 101.5.).    Marland Kitchen Fluticasone Propionate (FLONASE ALLERGY RELIEF NA) Place into the nose.    . ibuprofen (ADVIL) 800 MG tablet Take 1 tablet (800 mg total) by mouth every 8 (eight) hours as needed. 30 tablet 1  . Loratadine (CLARITIN PO) Take by mouth.    . Semaglutide,0.25 or 0.5MG /DOS, (OZEMPIC, 0.25 OR 0.5 MG/DOSE,) 2 MG/1.5ML SOPN Inject 0.25 mg into the skin once a week for 14 days, THEN 0.5 mg once a week for 14 days. 1.5 mL 0  . phentermine (ADIPEX-P) 37.5 MG tablet Take 1 tablet (37.5 mg total) by mouth daily  before breakfast. (Patient not taking: Reported on 07/14/2020) 30 tablet 0   No facility-administered medications prior to visit.   ROS See HPI  Objective:  BP 140/84 (BP Location: Left Arm, Patient Position: Sitting, Cuff Size: Large)   Pulse (!) 102   Temp 98.2 F (36.8 C) (Temporal)   Wt 202 lb 3.2 oz (91.7 kg)   SpO2 98%   BMI 38.21 kg/m   Physical Exam Vitals reviewed.  Cardiovascular:     Rate and Rhythm: Normal rate.     Pulses: Normal pulses.  Pulmonary:     Effort: Pulmonary effort is normal.  Musculoskeletal:        General: Tenderness present. No swelling or signs of injury. Normal range of motion.     Lumbar back: Normal.     Right hip: Normal.     Left hip: Normal.     Right upper leg: Normal.     Left upper leg: Normal.     Right knee: Normal.     Left knee: Normal.     Right lower leg: Normal. No edema.     Left lower leg: Normal. No edema.     Right ankle: Normal.     Right Achilles Tendon: Normal.     Left ankle: Normal.     Left Achilles Tendon: Normal.     Right foot: Normal.     Left foot: Normal.  Skin:    General: Skin is warm and dry.  Findings: No erythema.  Neurological:     Mental Status: She is alert and oriented to person, place, and time.    Assessment & Plan:  This visit occurred during the SARS-CoV-2 public health emergency.  Safety protocols were in place, including screening questions prior to the visit, additional usage of staff PPE, and extensive cleaning of exam room while observing appropriate contact time as indicated for disinfecting solutions.   Kiara Hall was seen today for follow-up.  Diagnoses and all orders for this visit:  Right leg pain -     VAS [place] LOWER EXTREMITY VENOUS (DVT); Future  Adjustment disorder with anxious mood  Class 2 severe obesity due to excess calories with serious comorbidity and body mass index (BMI) of 38.0 to 38.9 in adult [facility])  She is concerned about DVT. I tried to reassure her about low  probability of DVT due to absence of swelling. Explain that her symptoms are secondary to muscles strain due to lack of stretching before and after her exercise. Advised to use knee sleeve for joint support, perform stretching exercise for before and after exercise regimen, use ibuprofen OTC for pain as needed, use cold compress after exercise, and change exercise to low impact till injury resolves. She insisted on getting venous doppler to rule out DVT. Order entered.  Problem List Items Addressed This Visit      Other   Adjustment disorder with anxious mood    Anxious about overall health. Plans to schedule an appt with therapist of her choice      Obesity    Unable to tolerate ozempic injection at 0.25mg  weekly D/c medication       Other Visit Diagnoses    Right leg pain    -  Primary   Relevant Orders   VAS [place] LOWER EXTREMITY VENOUS (DVT)      Follow-up: No follow-ups on file.  Alysia Penna, NP

## 2020-07-14 NOTE — Assessment & Plan Note (Signed)
>>  ASSESSMENT AND PLAN FOR DEPRESSION WRITTEN ON 07/14/2020 11:08 AM BY Bravery Ketcham LUM, NP  Anxious about overall health. Plans to schedule an appt with therapist of her choice

## 2020-07-16 ENCOUNTER — Other Ambulatory Visit: Payer: Self-pay | Admitting: Nurse Practitioner

## 2020-07-16 ENCOUNTER — Telehealth: Payer: Self-pay | Admitting: Nurse Practitioner

## 2020-07-16 ENCOUNTER — Other Ambulatory Visit: Payer: Self-pay

## 2020-07-16 ENCOUNTER — Encounter: Payer: Self-pay | Admitting: Nurse Practitioner

## 2020-07-16 ENCOUNTER — Ambulatory Visit (HOSPITAL_COMMUNITY)
Admission: RE | Admit: 2020-07-16 | Discharge: 2020-07-16 | Disposition: A | Discharge status: Home / Self Care | Payer: Federal, State, Local not specified - PPO | Source: Ambulatory Visit | Attending: Nurse Practitioner | Admitting: Nurse Practitioner

## 2020-07-16 DIAGNOSIS — M79604 Pain in right leg: Secondary | ICD-10-CM | POA: Diagnosis not present

## 2020-07-16 DIAGNOSIS — I82811 Embolism and thrombosis of superficial veins of right lower extremities: Secondary | ICD-10-CM | POA: Insufficient documentation

## 2020-07-16 NOTE — Telephone Encounter (Signed)
Spoke to patient and she was calling to ask about her venus doppler results that were done today.  Advised her that we haven't gotten them yet and will get in touch with her when they are reviewed. Dm/cma

## 2020-07-16 NOTE — Telephone Encounter (Signed)
Patient is calling to get her test results. She states that she's concerned and would like to discuss what she was told while she was at the appointment. Please call her back at [phone].

## 2020-07-17 NOTE — Telephone Encounter (Signed)
Provided reassurance on recent venous doppler finding which is incidental and not related to current support. She is to continue use of compression stocking. Reminder about need to repeat venous doppler in 27month. If persistent leg pain despite use of NSAID and stretch exercise, will need referral to sports medicine. Will also consider referral to vein and vascular specialist after repeat venous doppler. She agreed I answered all her questions to her satisfaction.

## 2020-07-20 ENCOUNTER — Encounter (HOSPITAL_BASED_OUTPATIENT_CLINIC_OR_DEPARTMENT_OTHER): Payer: Self-pay

## 2020-07-20 ENCOUNTER — Other Ambulatory Visit: Payer: Self-pay

## 2020-07-20 ENCOUNTER — Emergency Department (HOSPITAL_BASED_OUTPATIENT_CLINIC_OR_DEPARTMENT_OTHER)
Admission: EM | Admit: 2020-07-20 | Discharge: 2020-07-20 | Disposition: A | Discharge status: Home / Self Care | Payer: Federal, State, Local not specified - PPO | Attending: Emergency Medicine | Admitting: Emergency Medicine

## 2020-07-20 DIAGNOSIS — M79604 Pain in right leg: Secondary | ICD-10-CM

## 2020-07-20 DIAGNOSIS — M79661 Pain in right lower leg: Secondary | ICD-10-CM | POA: Diagnosis not present

## 2020-07-20 MED ORDER — METHOCARBAMOL 500 MG PO TABS: 500.0000 mg | ORAL_TABLET | Freq: Two times a day (BID) | ORAL | 0 refills | Status: DC

## 2020-07-20 NOTE — ED Provider Notes (Signed)
[facility] EMERGENCY DEPARTMENT Provider Note   CSN: [id number] Arrival date & time: 07/20/20  0932     History Chief Complaint  Patient presents with  . Leg Pain    Kiara Hall is a 40 y.o. female.  HPI 40 year old female with anxiety, GERD presents to the ER with complaints of right lower leg pain.  This problem has been ongoing for several weeks, states that she has been doing some high impact workouts and started to develop pain in her right lower leg.  She was seen by her PCP, did have a DVT study which was negative.  She states that the pain got a little bit better, however it has still persisted.  She states that at times she will have a burning sensation in her lower leg.  She still has been able to ambulate though has not worked out since March 3.  She denies any numbness or tingling in her lower extremities, denies any loss of bowel bladder control.  Denies any foot drop.  States that she has been taking ibuprofen for pain, though this has not been helping.  She has been wearing compression socks per her PCPs recommendations.  She denies any chest pain, shortness of breath, dizziness, fevers, chills, leg swelling.  Denies any falls or injuries.    Past Medical History:  Diagnosis Date  . Anemia   . Anxiety   . Chicken pox   . GERD (gastroesophageal reflux disease)   . Palpitation   . Palpitation     Patient Active Problem List   Diagnosis Date Noted  . Chronic superficial venous thrombosis of lower extremity, right 07/16/2020  . Adjustment disorder with anxious mood 07/14/2020  . Situational anxiety 08/23/2019  . Obesity 04/19/2018  . Constipation 04/19/2018  . PALPITATIONS 01/17/2008  . ANEMIA-IRON DEFICIENCY 12/17/2007  . GERD 12/17/2007  . HEADACHE 12/17/2007  . ELEVATED BP READING WITHOUT DX HYPERTENSION 12/17/2007    Past Surgical History:  Procedure Laterality Date  . BREAST BIOPSY Left    benign   . CESAREAN SECTION N/A 02/09/2015    Procedure: CESAREAN SECTION;  Surgeon: Gerald Leitz, MD;  Location: [facility];  Service: Obstetrics;  Laterality: N/A;  . CESAREAN SECTION N/A 02/05/2019   Procedure: CESAREAN SECTION;  Surgeon: Gerald Leitz, MD;  Location: [facility];  Service: Obstetrics;  Laterality: N/A;  . TONSILLECTOMY    . TONSILLECTOMY       OB History    Gravida  2   Para  2   Term  2   Preterm      AB      Living  2     SAB      IAB      Ectopic      Multiple  0   Live Births  2           Family History  Problem Relation Age of Onset  . Diabetes Mother   . Hypertension Mother   . Heart disease Maternal Uncle 43  . Hypertension Maternal Grandfather   . Miscarriages / Stillbirths Sister   . Hypertension Maternal Aunt   . Stroke Maternal Grandmother     Social History   Tobacco Use  . Smoking status: Never Smoker  . Smokeless tobacco: Never Used  Vaping Use  . Vaping Use: Never used  Substance Use Topics  . Alcohol use: Yes    Comment: very litle  . Drug use: No    Home Medications  Prior to Admission medications   Medication Sig Start Date End Date Taking? Authorizing Provider  acetaminophen (TYLENOL) 325 MG tablet Take 2 tablets (650 mg total) by mouth every 4 (four) hours as needed for mild pain (temperature > 101.5.). 02/06/19  Yes Gerald Leitz, MD  Fluticasone Propionate [facility] ALLERGY RELIEF NA) Place into the nose.   Yes [provider]  ibuprofen (ADVIL) 800 MG tablet Take 1 tablet (800 mg total) by mouth every 8 (eight) hours as needed. 02/06/19  Yes Gerald Leitz, MD  Loratadine (CLARITIN PO) Take by mouth.   Yes [provider]  methocarbamol (ROBAXIN) 500 MG tablet Take 1 tablet (500 mg total) by mouth 2 (two) times daily. 07/20/20  Yes Mare Ferrari, PA-C    Allergies    Metronidazole  Review of Systems   Review of Systems  Constitutional: Negative for chills and fever.  Respiratory: Negative for shortness of breath.   Cardiovascular: Negative for  chest pain and leg swelling.  Musculoskeletal: Positive for arthralgias.  Skin: Negative for color change and wound.    Physical Exam Updated Vital Signs BP (!) 157/107 (BP Location: Right Arm)   Pulse (!) 106   Temp 98.7 F (37.1 C) (Oral)   Resp 18   Ht 5\' 1"  (1.549 m)   Wt 91.2 kg   SpO2 100%   BMI 37.98 kg/m   Physical Exam Vitals and nursing note reviewed.  Constitutional:      General: She is not in acute distress.    Appearance: She is well-developed. She is not ill-appearing, toxic-appearing or diaphoretic.  HENT:     Head: Normocephalic and atraumatic.  Eyes:     Conjunctiva/sclera: Conjunctivae normal.  Cardiovascular:     Rate and Rhythm: Normal rate and regular rhythm.     Pulses: Normal pulses.     Heart sounds: Normal heart sounds. No murmur heard.   Pulmonary:     Effort: Pulmonary effort is normal. No respiratory distress.     Breath sounds: Normal breath sounds.  Abdominal:     Palpations: Abdomen is soft.     Tenderness: There is no abdominal tenderness.  Musculoskeletal:        General: Tenderness present. Normal range of motion.     Cervical back: Neck supple.     Right lower leg: No edema.     Left lower leg: No edema.     Comments: Mild tenderness to palpation to the anterior lower tibia.  No overlying skin changes, no erythema, redness, drainage, warmth.  No visible deformities.  2+ DP pulses bilaterally, full flexion extension of the knee and ankles.  Patient able to ambulate without difficulty.  Neurovascularly intact.  Skin:    General: Skin is warm and dry.  Neurological:     General: No focal deficit present.     Mental Status: She is alert and oriented to person, place, and time.  Psychiatric:        Mood and Affect: Mood normal.        Behavior: Behavior normal.     ED Results / Procedures / Treatments   Labs (all labs ordered are listed, but only abnormal results are displayed) Labs Reviewed - No data to  display  EKG None  Radiology No results found.  Procedures Procedures   Medications Ordered in ED Medications - No data to display  ED Course  I have reviewed the triage vital signs and the nursing notes.  Pertinent labs & imaging results  that were available during my care of the patient were reviewed by me and considered in my medical decision making (see chart for details).    MDM Rules/Calculators/A&P                          40 year old female who presents to the ER with right lower leg pain.  This has been ongoing for several weeks.  On arrival, she is very well-appearing, no acute distress, resting comfortably in ER bed.  Vitals with mild tachycardia on arrival, over this improved through the ED course, and her blood pressure was elevated 157/107.  No signs of hypertensive urgency/emergency.  Physical exam of the right lower extremity benign, no overlying skin changes, no evidence of cellulitis, low suspicion for DVT given no swelling and also recent ultrasound which was negative.  No evidence of acute fractures.  No evidence of neurovascular compromise.  I did offer the an x-ray for the patient, however she feels that her pain is more muscular in nature and declined.  Will prescribe her a muscle relaxer, referral to sports medicine.  She is agreeable to this.  Return precautions discussed.  Stable for discharge. Final Clinical Impression(s) / ED Diagnoses Final diagnoses:  Right leg pain    Rx / DC Orders ED Discharge Orders         Ordered    methocarbamol (ROBAXIN) 500 MG tablet  2 times daily        07/20/20 1040           Leone Brand 07/20/20 1040    Terald Sleeper, MD 07/20/20 1758

## 2020-07-20 NOTE — Discharge Instructions (Signed)
Please take the muscle relaxer at night and do not drink or drive the medication as it can make you sleepy.  Please call the phone number of Dr. Clare Gandy to schedule an appointment.  Return to the ER for any new or worsening symptoms

## 2020-07-20 NOTE — ED Triage Notes (Signed)
R lower leg pain x 2 weeks.  Evaluated by PCP for DVT which was negative.  Pt reports being increasingly active lately and is using compression socks to help with the tension and burning in the lower leg.  Possible SVT dx

## 2020-07-25 DIAGNOSIS — J4521 Mild intermittent asthma with (acute) exacerbation: Secondary | ICD-10-CM | POA: Diagnosis not present

## 2020-08-04 ENCOUNTER — Ambulatory Visit: Payer: Self-pay

## 2020-08-04 ENCOUNTER — Other Ambulatory Visit: Payer: Self-pay

## 2020-08-04 ENCOUNTER — Encounter: Payer: Self-pay | Admitting: Family Medicine

## 2020-08-04 ENCOUNTER — Ambulatory Visit: Payer: Federal, State, Local not specified - PPO | Admitting: Family Medicine

## 2020-08-04 VITALS — BP 140/72 | Ht 61.0 in | Wt 201.0 lb

## 2020-08-04 DIAGNOSIS — M222X1 Patellofemoral disorders, right knee: Secondary | ICD-10-CM | POA: Insufficient documentation

## 2020-08-04 DIAGNOSIS — M79604 Pain in right leg: Secondary | ICD-10-CM

## 2020-08-04 DIAGNOSIS — G5751 Tarsal tunnel syndrome, right lower limb: Secondary | ICD-10-CM | POA: Diagnosis not present

## 2020-08-04 MED ORDER — PREDNISONE 5 MG PO TABS: ORAL_TABLET | ORAL | 0 refills | Status: DC

## 2020-08-04 NOTE — Progress Notes (Signed)
Kiara Hall - 40 y.o. female MRN [id number]  Date of birth: August 20, 1980  SUBJECTIVE:  Including CC & ROS.  No chief complaint on file.   Kiara Hall is a 40 y.o. female that is presenting with right lower leg pain.  Pain has been ongoing for a few weeks.  She did start doing high intensity workouts when she started having the symptoms.  No history of surgery.  She is having symptoms around the medial ankle as well as the anterior medial lower leg.   Review of Systems See HPI   HISTORY: Past Medical, Surgical, Social, and Family History Reviewed & Updated per EMR.   Pertinent Historical Findings include:  Past Medical History:  Diagnosis Date  . Anemia   . Anxiety   . Chicken pox   . GERD (gastroesophageal reflux disease)   . Palpitation   . Palpitation     Past Surgical History:  Procedure Laterality Date  . BREAST BIOPSY Left    benign   . CESAREAN SECTION N/A 02/09/2015   Procedure: CESAREAN SECTION;  Surgeon: Gerald Leitz, MD;  Location: [facility];  Service: Obstetrics;  Laterality: N/A;  . CESAREAN SECTION N/A 02/05/2019   Procedure: CESAREAN SECTION;  Surgeon: Gerald Leitz, MD;  Location: [facility];  Service: Obstetrics;  Laterality: N/A;  . TONSILLECTOMY    . TONSILLECTOMY      Family History  Problem Relation Age of Onset  . Diabetes Mother   . Hypertension Mother   . Heart disease Maternal Uncle 43  . Hypertension Maternal Grandfather   . Miscarriages / Stillbirths Sister   . Hypertension Maternal Aunt   . Stroke Maternal Grandmother     Social History   Socioeconomic History  . Marital status: Married    Spouse name: Not on file  . Number of children: Not on file  . Years of education: Not on file  . Highest education level: Not on file  Occupational History  . Not on file  Tobacco Use  . Smoking status: Never Smoker  . Smokeless tobacco: Never Used  Vaping Use  . Vaping Use: Never used  Substance and Sexual Activity  . Alcohol use: Yes     Comment: very litle  . Drug use: No  . Sexual activity: Yes    Birth control/protection: Condom  Other Topics Concern  . Not on file  Social History Narrative  . Not on file   Social Determinants of Health   Financial Resource Strain: Not on file  Food Insecurity: Not on file  Transportation Needs: Not on file  Physical Activity: Not on file  Stress: Not on file  Social Connections: Not on file  Intimate Partner Violence: Not on file     PHYSICAL EXAM:  VS: BP 140/72 (BP Location: Left Arm, Patient Position: Sitting, Cuff Size: Large)   Ht 5\' 1"  (1.549 m)   Wt 201 lb (91.2 kg)   BMI 37.98 kg/m  Physical Exam Gen: NAD, alert, cooperative with exam, well-appearing MSK:  Right leg: Pes planus. Normal strength resistance. Positive Tinel's at the tarsal tunnel. Normal knee range of motion. Neurovascular intact  Limited ultrasound: Right lower leg:  Right knee: No effusion of the suprapatellar pouch. Normal-appearing quadricep patellar tendon. Normal-appearing medial joint space.  Right leg: No changes of the medial gastrocnemius. Normal-appearing musculotendinous junction. Normal-appearing Achilles and no bursitis. No increased hyperemia of the midportion of the tibia  Right foot: Tibial nerve appears to be thickened in the tarsal  tunnel when compared proximally. No effusion of the ankle joint. Normal-appearing posterior tibialis.  Summary: Findings suggestive of irritation of the tibial nerve.  Ultrasound and interpretation by Clare Gandy, MD    ASSESSMENT & PLAN:   Tarsal tunnel syndrome of right side Has significant pes planus which seems to cause irritation within the tarsal tunnel -Counseled on home exercise therapy and supportive care. -Green sport insoles with scaphoid pads.  Could consider custom orthotics. -Prednisone. -Could consider physical therapy.  Patellofemoral pain syndrome of right knee Seems secondary to the lower extremity as  she started increasing her activity fairly rapidly. -Counseled on home exercise therapy and supportive care. -Could consider physical therapy.

## 2020-08-04 NOTE — Assessment & Plan Note (Signed)
Has significant pes planus which seems to cause irritation within the tarsal tunnel -Counseled on home exercise therapy and supportive care. -Green sport insoles with scaphoid pads.  Could consider custom orthotics. -Prednisone. -Could consider physical therapy.

## 2020-08-04 NOTE — Patient Instructions (Signed)
Nice to meet you Please avoid walking barefoot  Please try the medicine  Please try the exercises when the pain has improved   Please send me a message in MyChart with any questions or updates.  Please see me back in 4 weeks .   --Dr. Jordan Likes

## 2020-08-04 NOTE — Assessment & Plan Note (Signed)
Seems secondary to the lower extremity as she started increasing her activity fairly rapidly. -Counseled on home exercise therapy and supportive care. -Could consider physical therapy.

## 2020-08-06 ENCOUNTER — Encounter: Payer: Self-pay | Admitting: Nurse Practitioner

## 2020-08-06 DIAGNOSIS — I82811 Embolism and thrombosis of superficial veins of right lower extremities: Secondary | ICD-10-CM

## 2020-08-19 DIAGNOSIS — G5751 Tarsal tunnel syndrome, right lower limb: Secondary | ICD-10-CM | POA: Diagnosis not present

## 2020-08-19 DIAGNOSIS — M79671 Pain in right foot: Secondary | ICD-10-CM | POA: Diagnosis not present

## 2020-08-28 ENCOUNTER — Other Ambulatory Visit: Payer: Self-pay

## 2020-08-28 ENCOUNTER — Ambulatory Visit (HOSPITAL_COMMUNITY)
Admission: RE | Admit: 2020-08-28 | Discharge: 2020-08-28 | Disposition: A | Discharge status: Home / Self Care | Payer: Federal, State, Local not specified - PPO | Source: Ambulatory Visit | Attending: Cardiology | Admitting: Cardiology

## 2020-08-28 DIAGNOSIS — I82811 Embolism and thrombosis of superficial veins of right lower extremities: Secondary | ICD-10-CM | POA: Insufficient documentation

## 2020-09-01 ENCOUNTER — Ambulatory Visit: Payer: Federal, State, Local not specified - PPO | Admitting: Family Medicine

## 2020-09-01 ENCOUNTER — Other Ambulatory Visit: Payer: Self-pay

## 2020-09-01 ENCOUNTER — Encounter: Payer: Self-pay | Admitting: Family Medicine

## 2020-09-01 ENCOUNTER — Ambulatory Visit (HOSPITAL_BASED_OUTPATIENT_CLINIC_OR_DEPARTMENT_OTHER)
Admission: RE | Admit: 2020-09-01 | Discharge: 2020-09-01 | Disposition: A | Discharge status: Home / Self Care | Payer: Federal, State, Local not specified - PPO | Source: Ambulatory Visit | Attending: Family Medicine | Admitting: Family Medicine

## 2020-09-01 VITALS — BP 136/80 | Ht 61.0 in | Wt 201.0 lb

## 2020-09-01 DIAGNOSIS — M222X1 Patellofemoral disorders, right knee: Secondary | ICD-10-CM

## 2020-09-01 DIAGNOSIS — G5751 Tarsal tunnel syndrome, right lower limb: Secondary | ICD-10-CM | POA: Diagnosis not present

## 2020-09-01 DIAGNOSIS — M357 Hypermobility syndrome: Secondary | ICD-10-CM

## 2020-09-01 DIAGNOSIS — M25561 Pain in right knee: Secondary | ICD-10-CM | POA: Diagnosis not present

## 2020-09-01 NOTE — Assessment & Plan Note (Signed)
Has gotten significant improvement with the injection.  Has tried the [employer] sport insoles with the scaphoid pad but seem to aggravate her knee. -Counseled on home exercise therapy and supportive care. -Can try green sport insoles without the scaphoid pad

## 2020-09-01 NOTE — Progress Notes (Addendum)
Kiara Hall - 40 y.o. female MRN [id number]  Date of birth: 1980/07/28  SUBJECTIVE:  Including CC & ROS.  No chief complaint on file.   Kiara Hall is a 40 y.o. female that is following up for right foot pain.  Did receive an injection and that has improved.  Still experiencing acute right knee pain that seems to be worsening.  Review of Systems See HPI   HISTORY: Past Medical, Surgical, Social, and Family History Reviewed & Updated per EMR.   Pertinent Historical Findings include:  Past Medical History:  Diagnosis Date  . Anemia   . Anxiety   . Chicken pox   . GERD (gastroesophageal reflux disease)   . Palpitation   . Palpitation     Past Surgical History:  Procedure Laterality Date  . BREAST BIOPSY Left    benign   . CESAREAN SECTION N/A 02/09/2015   Procedure: CESAREAN SECTION;  Surgeon: Gerald Leitz, MD;  Location: [facility];  Service: Obstetrics;  Laterality: N/A;  . CESAREAN SECTION N/A 02/05/2019   Procedure: CESAREAN SECTION;  Surgeon: Gerald Leitz, MD;  Location: [facility];  Service: Obstetrics;  Laterality: N/A;  . TONSILLECTOMY    . TONSILLECTOMY      Family History  Problem Relation Age of Onset  . Diabetes Mother   . Hypertension Mother   . Heart disease Maternal Uncle 43  . Hypertension Maternal Grandfather   . Miscarriages / Stillbirths Sister   . Hypertension Maternal Aunt   . Stroke Maternal Grandmother     Social History   Socioeconomic History  . Marital status: Married    Spouse name: Not on file  . Number of children: Not on file  . Years of education: Not on file  . Highest education level: Not on file  Occupational History  . Not on file  Tobacco Use  . Smoking status: Never Smoker  . Smokeless tobacco: Never Used  Vaping Use  . Vaping Use: Never used  Substance and Sexual Activity  . Alcohol use: Yes    Comment: very litle  . Drug use: No  . Sexual activity: Yes    Birth control/protection: Condom  Other Topics Concern  .  Not on file  Social History Narrative  . Not on file   Social Determinants of Health   Financial Resource Strain: Not on file  Food Insecurity: Not on file  Transportation Needs: Not on file  Physical Activity: Not on file  Stress: Not on file  Social Connections: Not on file  Intimate Partner Violence: Not on file     PHYSICAL EXAM:  VS: BP 136/80 (BP Location: Left Arm, Patient Position: Sitting, Cuff Size: Large)   Ht 5\' 1"  (1.549 m)   Wt 201 lb (91.2 kg)   BMI 37.98 kg/m  Physical Exam Gen: NAD, alert, cooperative with exam, well-appearing MSK:  Able to take each thumb to the forearm. Can hyperextend pinky finger to 90 degrees. Hyperextension of the elbows. Hyperextension of the knees. Can place hands flat on floor. Neurovascular intact     ASSESSMENT & PLAN:   I spent 30 minutes with this patient, greater than 50% was face-to-face time counseling regarding the below diagnosis.   Hypermobility syndrome Beighton score 9/9.  Likely contributing to her knee pain.  Patellofemoral pain syndrome of right knee Most likely having stability issues given the anterior nature of also having posterior component of the knee pain.  Does have hyperextension on exam. -Counseled on home  exercise therapy and supportive care. -Referral to physical therapy. -X-ray. -Could consider gabapentin.  - The patient will benefit from the at home use of a Zynex NexWave with the clinically proven, TENS, IFC and NMES modalities to reduce pain, improve function and reduce medication use.  Continued use of the above drug-free treatment options are both reasonable and medically necessary at this time.   Tarsal tunnel syndrome of right side Has gotten significant improvement with the injection.  Has tried the [employer] sport insoles with the scaphoid pad but seem to aggravate her knee. -Counseled on home exercise therapy and supportive care. -Can try green sport insoles without the scaphoid pad

## 2020-09-01 NOTE — Patient Instructions (Signed)
Good to see you Please try the physical therapy  Please let me know about the gabapentin  Please let me know if the neuromuscular clinic needs a referral   I will call with the results from today  Please send me a message in MyChart with any questions or updates.  Please see me back in 4 weeks.   --Dr. Jordan Likes

## 2020-09-01 NOTE — Assessment & Plan Note (Addendum)
Most likely having stability issues given the anterior nature of also having posterior component of the knee pain.  Does have hyperextension on exam. -Counseled on home exercise therapy and supportive care. -Referral to physical therapy. -X-ray. -Could consider gabapentin.  - The patient will benefit from the at home use of a Zynex NexWave with the clinically proven, TENS, IFC and NMES modalities to reduce pain, improve function and reduce medication use.  Continued use of the above drug-free treatment options are both reasonable and medically necessary at this time.

## 2020-09-01 NOTE — Assessment & Plan Note (Addendum)
Beighton score 9/9.  Likely contributing to her knee pain.

## 2020-09-03 ENCOUNTER — Encounter: Payer: Self-pay | Admitting: Family Medicine

## 2020-09-04 ENCOUNTER — Telehealth: Payer: Self-pay | Admitting: Family Medicine

## 2020-09-04 NOTE — Telephone Encounter (Signed)
Informed of results.   Myra Rude, MD [facility] 09/04/2020, 8:10 AM

## 2020-09-10 ENCOUNTER — Ambulatory Visit: Payer: Federal, State, Local not specified - PPO | Admitting: Physical Therapy

## 2020-09-25 ENCOUNTER — Encounter: Payer: Self-pay | Admitting: Nurse Practitioner

## 2020-09-29 ENCOUNTER — Ambulatory Visit: Payer: Federal, State, Local not specified - PPO | Admitting: Family Medicine

## 2020-09-29 NOTE — Progress Notes (Deleted)
  MAYRE BURY - 40 y.o. female MRN [id number]  Date of birth: 1980-11-24  SUBJECTIVE:  Including CC & ROS.  No chief complaint on file.   JECENIA LEAMER is a 40 y.o. female that is  ***.  ***   Review of Systems See HPI   HISTORY: Past Medical, Surgical, Social, and Family History Reviewed & Updated per EMR.   Pertinent Historical Findings include:  Past Medical History:  Diagnosis Date  . Anemia   . Anxiety   . Chicken pox   . GERD (gastroesophageal reflux disease)   . Palpitation   . Palpitation     Past Surgical History:  Procedure Laterality Date  . BREAST BIOPSY Left    benign   . CESAREAN SECTION N/A 02/09/2015   Procedure: CESAREAN SECTION;  Surgeon: Gerald Leitz, MD;  Location: [facility];  Service: Obstetrics;  Laterality: N/A;  . CESAREAN SECTION N/A 02/05/2019   Procedure: CESAREAN SECTION;  Surgeon: Gerald Leitz, MD;  Location: [facility] LD ORS;  Service: Obstetrics;  Laterality: N/A;  . TONSILLECTOMY    . TONSILLECTOMY      Family History  Problem Relation Age of Onset  . Diabetes Mother   . Hypertension Mother   . Heart disease Maternal Uncle 43  . Hypertension Maternal Grandfather   . Miscarriages / Stillbirths Sister   . Hypertension Maternal Aunt   . Stroke Maternal Grandmother     Social History   Socioeconomic History  . Marital status: Married    Spouse name: Not on file  . Number of children: Not on file  . Years of education: Not on file  . Highest education level: Not on file  Occupational History  . Not on file  Tobacco Use  . Smoking status: Never Smoker  . Smokeless tobacco: Never Used  Vaping Use  . Vaping Use: Never used  Substance and Sexual Activity  . Alcohol use: Yes    Comment: very litle  . Drug use: No  . Sexual activity: Yes    Birth control/protection: Condom  Other Topics Concern  . Not on file  Social History Narrative  . Not on file   Social Determinants of Health   Financial Resource Strain: Not on file  Food  Insecurity: Not on file  Transportation Needs: Not on file  Physical Activity: Not on file  Stress: Not on file  Social Connections: Not on file  Intimate Partner Violence: Not on file     PHYSICAL EXAM:  VS: There were no vitals taken for this visit. Physical Exam Gen: NAD, alert, cooperative with exam, well-appearing MSK:  ***      ASSESSMENT & PLAN:   No problem-specific Assessment & Plan notes found for this encounter.

## 2020-10-05 DIAGNOSIS — D649 Anemia, unspecified: Secondary | ICD-10-CM | POA: Diagnosis not present

## 2020-10-05 DIAGNOSIS — Z3A08 8 weeks gestation of pregnancy: Secondary | ICD-10-CM | POA: Diagnosis not present

## 2020-10-05 DIAGNOSIS — O09521 Supervision of elderly multigravida, first trimester: Secondary | ICD-10-CM | POA: Diagnosis not present

## 2020-10-05 DIAGNOSIS — Z3201 Encounter for pregnancy test, result positive: Secondary | ICD-10-CM | POA: Diagnosis not present

## 2020-10-05 DIAGNOSIS — Z3480 Encounter for supervision of other normal pregnancy, unspecified trimester: Secondary | ICD-10-CM | POA: Diagnosis not present

## 2020-10-05 LAB — OB RESULTS CONSOLE ABO/RH: RH Type: POSITIVE

## 2020-10-05 LAB — OB RESULTS CONSOLE RUBELLA ANTIBODY, IGM: Rubella: IMMUNE

## 2020-10-05 LAB — OB RESULTS CONSOLE HIV ANTIBODY (ROUTINE TESTING): HIV: NONREACTIVE

## 2020-10-05 LAB — OB RESULTS CONSOLE HEPATITIS B SURFACE ANTIGEN: Hepatitis B Surface Ag: NEGATIVE

## 2020-10-05 LAB — OB RESULTS CONSOLE RPR: RPR: NONREACTIVE

## 2020-10-15 DIAGNOSIS — Z3481 Encounter for supervision of other normal pregnancy, first trimester: Secondary | ICD-10-CM | POA: Diagnosis not present

## 2020-10-15 DIAGNOSIS — Z3490 Encounter for supervision of normal pregnancy, unspecified, unspecified trimester: Secondary | ICD-10-CM | POA: Diagnosis not present

## 2020-10-15 LAB — OB RESULTS CONSOLE GC/CHLAMYDIA
Chlamydia: NEGATIVE
Gonorrhea: NEGATIVE

## 2020-10-20 DIAGNOSIS — Z3481 Encounter for supervision of other normal pregnancy, first trimester: Secondary | ICD-10-CM | POA: Diagnosis not present

## 2020-10-20 LAB — HM PAP SMEAR

## 2020-12-14 DIAGNOSIS — Z36 Encounter for antenatal screening for chromosomal anomalies: Secondary | ICD-10-CM | POA: Diagnosis not present

## 2020-12-14 DIAGNOSIS — O09522 Supervision of elderly multigravida, second trimester: Secondary | ICD-10-CM | POA: Diagnosis not present

## 2020-12-24 ENCOUNTER — Telehealth: Payer: Self-pay

## 2020-12-24 NOTE — Telephone Encounter (Signed)
Referral notes received from [facility], Phone #: [phone], Fax #: [phone]   A copy of the notes have been placed in the scheduling box for check-out to pick-up and to enter referral. Original notes placed in file cabinet.

## 2021-01-02 ENCOUNTER — Inpatient Hospital Stay (HOSPITAL_BASED_OUTPATIENT_CLINIC_OR_DEPARTMENT_OTHER): Payer: Federal, State, Local not specified - PPO

## 2021-01-02 ENCOUNTER — Inpatient Hospital Stay (HOSPITAL_COMMUNITY)
Admission: AD | Admit: 2021-01-02 | Discharge: 2021-01-02 | Disposition: A | Discharge status: Home / Self Care | Payer: Federal, State, Local not specified - PPO | Attending: Obstetrics & Gynecology | Admitting: Obstetrics & Gynecology

## 2021-01-02 ENCOUNTER — Encounter (HOSPITAL_COMMUNITY): Payer: Self-pay | Admitting: Obstetrics and Gynecology

## 2021-01-02 ENCOUNTER — Other Ambulatory Visit: Payer: Self-pay

## 2021-01-02 DIAGNOSIS — R030 Elevated blood-pressure reading, without diagnosis of hypertension: Secondary | ICD-10-CM

## 2021-01-02 DIAGNOSIS — O26892 Other specified pregnancy related conditions, second trimester: Secondary | ICD-10-CM

## 2021-01-02 DIAGNOSIS — R109 Unspecified abdominal pain: Secondary | ICD-10-CM

## 2021-01-02 DIAGNOSIS — Z0375 Encounter for suspected cervical shortening ruled out: Secondary | ICD-10-CM

## 2021-01-02 DIAGNOSIS — Z3A21 21 weeks gestation of pregnancy: Secondary | ICD-10-CM

## 2021-01-02 DIAGNOSIS — M7918 Myalgia, other site: Secondary | ICD-10-CM | POA: Diagnosis not present

## 2021-01-02 DIAGNOSIS — O162 Unspecified maternal hypertension, second trimester: Secondary | ICD-10-CM

## 2021-01-02 DIAGNOSIS — Z3686 Encounter for antenatal screening for cervical length: Secondary | ICD-10-CM | POA: Insufficient documentation

## 2021-01-02 DIAGNOSIS — R102 Pelvic and perineal pain: Secondary | ICD-10-CM | POA: Insufficient documentation

## 2021-01-02 DIAGNOSIS — N9489 Other specified conditions associated with female genital organs and menstrual cycle: Secondary | ICD-10-CM

## 2021-01-02 DIAGNOSIS — M7989 Other specified soft tissue disorders: Secondary | ICD-10-CM | POA: Diagnosis not present

## 2021-01-02 DIAGNOSIS — N949 Unspecified condition associated with female genital organs and menstrual cycle: Secondary | ICD-10-CM

## 2021-01-02 LAB — COMPREHENSIVE METABOLIC PANEL
ALT: 10 U/L (ref 0–44)
AST: 16 U/L (ref 15–41)
Albumin: 2.9 g/dL — ABNORMAL LOW (ref 3.5–5.0)
Alkaline Phosphatase: 45 U/L (ref 38–126)
Anion gap: 8 (ref 5–15)
BUN: 7 mg/dL (ref 6–20)
CO2: 23 mmol/L (ref 22–32)
Calcium: 9.4 mg/dL (ref 8.9–10.3)
Chloride: 104 mmol/L (ref 98–111)
Creatinine, Ser: 0.47 mg/dL (ref 0.44–1.00)
GFR, Estimated: >60 mL/min (ref >60)
Glucose, Bld: 94 mg/dL (ref 70–99)
Potassium: 4.1 mmol/L (ref 3.5–5.1)
Sodium: 135 mmol/L (ref 135–145)
Total Bilirubin: 0.6 mg/dL (ref 0.3–1.2)
Total Protein: 6.6 g/dL (ref 6.5–8.1)

## 2021-01-02 LAB — CBC
HCT: 39.3 % (ref 36.0–46.0)
Hemoglobin: 12.7 g/dL (ref 12.0–15.0)
MCH: 24.6 pg — ABNORMAL LOW (ref 26.0–34.0)
MCHC: 32.3 g/dL (ref 30.0–36.0)
MCV: 76 fL — ABNORMAL LOW (ref 80.0–100.0)
Platelets: 170 10*3/uL (ref 150–400)
RBC: 5.17 MIL/uL — ABNORMAL HIGH (ref 3.87–5.11)
RDW: 26.5 % — ABNORMAL HIGH (ref 11.5–15.5)
WBC: 5 10*3/uL (ref 4.0–10.5)
nRBC: 0 % (ref 0.0–0.2)

## 2021-01-02 LAB — URINALYSIS, ROUTINE W REFLEX MICROSCOPIC
Bilirubin Urine: NEGATIVE
Glucose, UA: NEGATIVE mg/dL
Hgb urine dipstick: NEGATIVE
Ketones, ur: NEGATIVE mg/dL
Leukocytes,Ua: NEGATIVE
Nitrite: NEGATIVE
Protein, ur: NEGATIVE mg/dL
Specific Gravity, Urine: 1.008 (ref 1.005–1.030)
pH: 7 (ref 5.0–8.0)

## 2021-01-02 LAB — PROTEIN / CREATININE RATIO, URINE
Creatinine, Urine: 32.83 mg/dL
Total Protein, Urine: <6 mg/dL

## 2021-01-02 LAB — WET PREP, GENITAL
Sperm: NONE SEEN
Trich, Wet Prep: NONE SEEN
Yeast Wet Prep HPF POC: NONE SEEN

## 2021-01-02 MED ORDER — LABETALOL HCL 100 MG PO TABS: 100.0000 mg | ORAL_TABLET | Freq: Two times a day (BID) | ORAL | 3 refills | Status: DC

## 2021-01-02 NOTE — MAU Provider Note (Signed)
CC:  Chief Complaint  Patient presents with   Pelvic Pain     Event Date/Time   First Provider Initiated Contact with Patient 01/02/21 [id number]      HPI: Kiara Hall is a 40 y.o. year old G10P2012 female at [redacted]w[redacted]d weeks gestation who presents to MAU reporting sharp, intermittent left low abd pain and left mid back pain since 12/31/2020. Pain is worse with movement.  Minimal pain at rest.  Started walking 30 minutes/day on 9/1.  Has been pushing her 46-year-old in a stroller and concerned that this activity might have caused her pain.  States her OB recommended that she start walking 10 minutes/day.  Incidentally found to have elevated blood pressure.  Denies history of hypertension.  States home blood pressures are 120s over 70s (husband has a cough).  States she is very anxious in healthcare settings.  Associated Sx: Denies vaginal bleeding, vaginal discharge, urinary complaints, GI complaints, headache, vision changes, epigastric pain. Vaginal bleeding: Denies Leaking of fluid: Denies Fetal movement: Active  O: Patient Vitals for the past 24 hrs:  BP Temp Temp src Pulse Resp SpO2 Height Weight  01/02/21 1225 -- -- -- -- -- 99 % -- --  01/02/21 1220 -- -- -- -- -- 98 % -- --  01/02/21 1215 -- -- -- -- -- 99 % -- --  01/02/21 1210 -- -- -- -- -- 99 % -- --  01/02/21 1207 (!) 141/82 -- -- 94 -- -- -- --  01/02/21 0945 (!) 146/67 -- -- (!) 103 -- 99 % -- --  01/02/21 0940 -- -- -- -- -- 99 % -- --  01/02/21 0935 -- -- -- -- -- 99 % -- --  01/02/21 0930 -- -- -- -- -- 100 % -- --  01/02/21 0925 -- -- -- -- -- 100 % -- --  01/02/21 0920 -- -- -- -- -- 99 % -- --  01/02/21 0915 -- -- -- -- -- 100 % -- --  01/02/21 0910 -- -- -- -- -- 99 % -- --  01/02/21 0905 -- -- -- -- -- 99 % -- --  01/02/21 0900 (!) 145/71 -- -- (!) 105 -- -- -- --  01/02/21 0859 -- -- -- -- -- 100 % -- --  01/02/21 0855 -- -- -- -- -- 98 % -- --  01/02/21 0850 -- -- -- -- -- 99 % -- --  01/02/21 0845 131/72 -- --  (!) 105 -- 99 % -- --  01/02/21 0840 -- -- -- -- -- 99 % -- --  01/02/21 0835 (!) 150/76 -- -- (!) 107 -- 99 % -- --  01/02/21 0830 -- -- -- -- -- 100 % -- --  01/02/21 0825 -- -- -- -- -- 100 % -- --  01/02/21 0820 -- -- -- -- -- 100 % -- --  01/02/21 0817 (!) 150/75 -- -- 100 -- -- -- --  01/02/21 0758 138/79 98 F (36.7 C) Oral (!) 110 17 99 % 5\' 1"  (1.549 m) 102.9 kg    General: NAD Heart: Regular rate Lungs: Normal rate and effort Abd: Soft, NT, Gravid, 3/U Pelvic: NEFG, cervix closed, difficult to assess length.  No leaking of fluid or vaginal bleeding.   Fetal heart rate 145 by Doppler.   Results for orders placed or performed during the hospital encounter of 01/02/21 (from the past 24 hour(s))  Urinalysis, Routine w reflex microscopic Urine, Clean Catch     Status: Abnormal  Collection Time: 01/02/21  8:10 AM  Result Value Ref Range   Color, Urine STRAW (A) YELLOW   APPearance CLEAR CLEAR   Specific Gravity, Urine 1.008 1.005 - 1.030   pH 7.0 5.0 - 8.0   Glucose, UA NEGATIVE NEGATIVE mg/dL   Hgb urine dipstick NEGATIVE NEGATIVE   Bilirubin Urine NEGATIVE NEGATIVE   Ketones, ur NEGATIVE NEGATIVE mg/dL   Protein, ur NEGATIVE NEGATIVE mg/dL   Nitrite NEGATIVE NEGATIVE   Leukocytes,Ua NEGATIVE NEGATIVE  Protein / creatinine ratio, urine     Status: None   Collection Time: 01/02/21  9:10 AM  Result Value Ref Range   Creatinine, Urine 32.83 mg/dL   Total Protein, Urine <6.0 mg/dL   Protein Creatinine Ratio        0.00 - 0.15 mg/mg[Cre]  CBC     Status: Abnormal   Collection Time: 01/02/21  9:24 AM  Result Value Ref Range   WBC 5.0 4.0 - 10.5 K/uL   RBC 5.17 (H) 3.87 - 5.11 MIL/uL   Hemoglobin 12.7 12.0 - 15.0 g/dL   HCT 76.1 95.0 - 93.2 %   MCV 76.0 (L) 80.0 - 100.0 fL   MCH 24.6 (L) 26.0 - 34.0 pg   MCHC 32.3 30.0 - 36.0 g/dL   RDW 67.1 (H) 24.5 - 80.9 %   Platelets 170 150 - 400 K/uL   nRBC 0.0 0.0 - 0.2 %  Comprehensive metabolic panel     Status: Abnormal    Collection Time: 01/02/21  9:24 AM  Result Value Ref Range   Sodium 135 135 - 145 mmol/L   Potassium 4.1 3.5 - 5.1 mmol/L   Chloride 104 98 - 111 mmol/L   CO2 23 22 - 32 mmol/L   Glucose, Bld 94 70 - 99 mg/dL   BUN 7 6 - 20 mg/dL   Creatinine, Ser 9.83 0.44 - 1.00 mg/dL   Calcium 9.4 8.9 - 38.2 mg/dL   Total Protein 6.6 6.5 - 8.1 g/dL   Albumin 2.9 (L) 3.5 - 5.0 g/dL   AST 16 15 - 41 U/L   ALT 10 0 - 44 U/L   Alkaline Phosphatase 45 38 - 126 U/L   Total Bilirubin 0.6 0.3 - 1.2 mg/dL   GFR, Estimated >50 >53 mL/min   Anion gap 8 5 - 15  Wet prep, genital     Status: Abnormal   Collection Time: 01/02/21  9:34 AM  Result Value Ref Range   Yeast Wet Prep HPF POC NONE SEEN NONE SEEN   Trich, Wet Prep NONE SEEN NONE SEEN   Clue Cells Wet Prep HPF POC PRESENT (A) NONE SEEN   WBC, Wet Prep HPF POC MODERATE (A) NONE SEEN   Sperm NONE SEEN     MAU course Orders Placed This Encounter  Procedures   Wet prep, genital   [place] MFM OB TRANSVAGINAL   Urinalysis, Routine w reflex microscopic Urine, Clean Catch   CBC   Protein / creatinine ratio, urine   Comprehensive metabolic panel   No orders of the defined types were placed in this encounter.  MDM - Left lower quadrant and back pain x3 days.  Onset coincides with initiation of new physical activity.  Patient had previously been very sedentary.  Cervix 3.6 cm on transvaginal ultrasound.  No evidence of preterm labor infection or other pregnancy complication.  Pain is most likely musculoskeletal/round ligament pain.  No evidence of emergent condition.  Recommend patient use maternity belt, rest for 1 day  and increase physical activity gradually.  Reassured her that walking and pushing a stroller in her pregnancy is no danger to mom or baby and has many benefits.  - New finding of elevated blood pressures. No preeclampsia symptoms.  Normal labs.  However, on review of Epic, pt has had numerous elevated BP outside of pregnancy. Likely CHTN.  Will start Labetalol 100 mg PO BID.  Will have patient call office when it opens on 01/05/2021 for blood pressure check.  A: [redacted]w[redacted]d week IUP Round ligament pain of pregnancy Musculoskeletal pain Elevated blood pressure without diagnosis of hypertension  P: Discharge home in stable condition. Preterm labor precautions and fetal kick counts. Comfort measures Preeclampsia precautions Call office 01/05/2021 to schedule blood pressure check. Return to maternity admissions as needed if symptoms worsen. Allergies as of 01/02/2021       Reactions   Metronidazole Hives, Shortness Of Breath   Flagyl/rash        Medication List     STOP taking these medications    ibuprofen 800 MG tablet Commonly known as: ADVIL   predniSONE 5 MG tablet Commonly known as: DELTASONE       TAKE these medications    acetaminophen 325 MG tablet Commonly known as: TYLENOL Take 2 tablets (650 mg total) by mouth every 4 (four) hours as needed for mild pain (temperature > 101.5.).   CLARITIN PO Take by mouth.   FLONASE ALLERGY RELIEF NA Place into the nose.   labetalol 100 MG tablet Commonly known as: NORMODYNE Take 1 tablet (100 mg total) by mouth 2 (two) times daily.   methocarbamol 500 MG tablet Commonly known as: ROBAXIN Take 1 tablet (500 mg total) by mouth 2 (two) times daily.   multivitamin-prenatal 27-0.8 MG Tabs tablet Take 1 tablet by mouth daily at 12 noon.        Katrinka Blazing, [place], [place] 01/02/2021 12:48 PM  3

## 2021-01-02 NOTE — Discharge Instructions (Signed)
[facility] Clinical Guideline: Hypertension in Pregnancy  Hypertension complicates approximately 10% of pregnancies. It is associated with an increased risk for fetal morbidity (12% of preterm deliveries), maternal morbidity (stroke, renal failure, heart failure, eclampsia) and is one of the most common etiologies for maternal mortality (12-13%). The [employer] of Obstetricians and Gynecologist published recommendations from their Task Force on Hypertension in Pregnancy in November 2013. The following guideline is based primarily on recommendations from the ACOG Task Force.  Classification:  Chronic hypertension Gestational hypertension Preeclampsia without severe features with severe features Chronic hypertension with superimposed preeclampsia without severe features with severe features  The terms pregnancy induced hypertension (PIH) and mild preeclampsia should be avoided.  Definitions:  Hypertension (either of the following) taken > 4 hours apart: systolic blood pressure > 140 or diastolic blood pressure > 90  technique: sitting position with cuff at level of right atrium Avoid improper technique: left lateral with cuff on upper arm (gives falsely low readings)  Severe hypertension (either of following): systolic blood pressure > 160 or diastolic blood pressure > 110 the diagnosis of severe hypertension can be made sooner than 4 hours to expedite anti-hypertensive therapy  Proteinuria: 24 hour urine: > 300 mg protein/creatinine ratio: > 0.3  Chronic Hypertension: hypertension present before pregnancy or before [redacted] weeks  Gestational hypertension: hypertension that develops after 20 weeks no proteinuria Preeclampsia: Hypertension that develops after 20 weeks plus one of the following proteinuria severe features: severe hypertension (SBP > 160 or DBP > 110) thrombocytopenia (platelets <100,000) impaired liver function (LFTs 2x normal or persistent RUQ or  epigastric pain) new onset/worsening renal insufficiency (creatinine > 1.1 mg/dL or doubling of creatinine) pulmonary edema new onset cerebral or visual disturbances (amount of proteinuria is no longer a criterion for severe preeclampsia) Chronic Hypertension   Definition (either of the following): present before pregnancy present before [redacted] weeks gestation   Antihypertensive therapy indicated: persistent SBP > 160 or persistent DBP > 105 lower values may be used for starting therapy, in presence of end-organ damage (i.e. renal, cardiac disease) BP goal: SBP 120-159 DBP 80-105   Anti-hypertensive medications (first-line)  Agent Dose Comments  Labetalol [phone] mg/d (2-3 divided doses) caution in patients with asthma or CHF  Nifedipine 30-120 mg XL avoid sublingual capsules  Methyldopa [phone] mg (2-3 divided doses) least effective for severe hypertension   Antenatal Surveillance  Ultrasound: screen for growth restriction (timing not addressed by ACOG) 28-32 weeks at minimum more frequent, if fundal height lagging, difficult to assess fundal height, or coexisting problems  Antenatal testing, if taking antihypertensive medication or coexisting medical problems (diabetes, preeclampsia, etc) 1-2x per week, if hypertension only 2x per week, if fetal growth restriction present umbilical artery Doppler weekly, if growth restriction present  Delivery timing  >[redacted]w[redacted]d, if no other indications except for chronic hypertension   Seizure prophylaxis  Magnesium sulfate not indicated, unless there is superimposed preeclampsia with severe features Gestational Hypertension   Definition (all of the following) hypertension that develops after 20 weeks no proteinuria no severe features   Antihypertensive therapy: therapy is not indicated for mild gestational hypertension (BP 140-159/90-105) if blood pressure is > 160 or 110, then patient has severe features and therapy is  indicated (see section VI)    Management daily assessment for symptoms (headache, visual changes, epigastric/RUQ pain) blood pressure assessment 2x per week 2x per week in office (or once in office and once at home) weekly assessment of protein labs weekly: LFTs, platelets, creatinine  Antenatal  Surveillance Fetal movement counts: daily by patient  Ultrasound: screen for growth every 3 weeks after diagnosis  Antenatal testing: once weekly (NST + AFI) or BPP  Delivery timing  >[redacted]w[redacted]d, or sooner if there are other indications (nonreassuring testing, severe features, etc)   Seizure prophylaxis  Magnesium sulfate is not universally indicated Begin magnesium sulfate, if severe disease develops during labor or postpartum Preeclampsia without severe features   Definition: Hypertension that develops after 20 weeks and each of the following proteinuria (> 300 mg/24 hours or P/C > 0.3) no severe features: severe hypertension (SBP > 160 or DBP > 110) thrombocytopenia (platelets <100,000) impaired liver function (LFTs 2x normal or persistent RUQ or epigastric pain) new onset/worsening renal insufficiency (creatinine > 1.1 mg/dL or doubling of creatinine) pulmonary edema new onset cerebral or visual disturbances (amount of proteinuria is not a criterion for severe preeclampsia)  Antihypertensive therapy: therapy is not indicated for mild hypertension (BP 140-159/90-105) if blood pressure is > 160 or 110, then patient has severe features and therapy is indicated (see section VI)    Management of preeclampsia without severe features daily assessment for symptoms (headache, visual changes, epigastric/RUQ pain) blood pressure assessment 2x per week labs weekly: LFTs, platelets, creatinine  Antenatal Surveillance for preeclampsia without severe features Fetal movement counts: daily by patient  Ultrasound: screen for growth restriction every 3 weeks after diagnosis  Antenatal  testing: twice weekly (NST + AFI) or BPP   Delivery timing for preeclampsia without severe features  >110w0d, or sooner if there are other indications (nonreassuring testing, severe features, etc)     Seizure prophylaxis  Magnesium sulfate is not universally indicated for preeclampsia without severe features should be started, if there is progression to severe disease during labor or postpartum can be used for preeclampsia without severe features on an individual basis Preeclampsia with severe features  Definition: Hypertension that develops after 20 weeks and: severe features: severe hypertension (SBP > 160 or DBP > 110) thrombocytopenia (platelets <100,000) impaired liver function (LFTs 2x normal or persistent RUQ or epigastric pain) new onset/worsening renal insufficiency (creatinine > 1.1 mg/dL or doubling of creatinine) pulmonary edema new onset cerebral or visual disturbances (amount of proteinuria is not a criterion for severe preeclampsia) proteinuria is not required for the diagnosis of preeclampsia with severe features   Antihypertensive therapy: therapy is indicated for severe hypertension: SBP > 160 or DBP > 110   Antihypertensive agents for acute, severe hypertension:  Agent Dose Comments  Labetalol 10-20 mg IV initially contraindicated if asthma, CHF, or heart disease then 20-80 mg every20-30 minutes maximum acute dose of 300 mg consider IV drip if persistent: 1-2 mg/min  Hydralazine 5 mg IV high or frequent doses may cause hypotension then 5-10 mg IV every 20-40 minutes  Nifedipine 10-20 mg orally reflex tachycardia and headaches common may repeat in 30 minutes, if needed then, 10-20 mg every 2-6 hours   Management of preeclampsia with severe features hospitalization; antenatal steroids if < [redacted]w[redacted]d monitor for signs/symptoms of severe disease (headaches, visual changes, RUQ/epigastric pain) daily labs: CBC, LFTs, creatinine  Antenatal Surveillance  for preeclampsia with severe features Fetal movement counts: daily by patient  Ultrasound: screen for growth every 2 weeks after diagnosis  Antenatal testing: NST daily BPP 2x per week  Seizure prophylaxis Magnesium sulfate is recommended for preeclampsia with severe features Continue magnesium sulfate during Cesarean delivery (i.e. do not stop intraoperatively)  Delivery timing for preeclampsia with severe features:  Delivery is indicated at the time of  diagnosis for any of the following: gestational age < [redacted]w[redacted]d gestational age > [redacted]w[redacted]d fetal demise eclampsia uncontrollable hypertension pulmonary edema abruption DIC nonreassuring testing  b. [redacted]w[redacted]d-[redacted]w[redacted]d: Give antenatal steroids and delay delivery 48 hours, if patient remains stable and has: PROM platelets < 100,000 impaired liver function new onset/worsening renal insufficiency EFW < 5th percentile AFI < 5 cm abnormal umbilical artery Doppler  c. [redacted]w[redacted]d-[redacted]w[redacted]d: Delivery may be delayed > 48 hours, if patient is stable with: severe hypertension, which is controllable with medication transient lab abnormalities that resolve (thrombocytopenia, elevated LFTs) no other features of severe disease        Chronic hypertension with superimposed preeclampsia  Definition: Hypertension that is present before pregnancy or before 20 weeks and is classified as:  CHTN with superimposed preeclampsia without severe features hypertension and proteinuria only proteinuria is new onset or worsening  CHTN with superimposed preeclampsia with severe features (+/- proteinuria): severe hypertension (SBP > 160 or DBP > 110) thrombocytopenia (platelets <100,000) impaired liver function (LFTs 2x normal or persistent RUQ or epigastric pain) new onset/worsening renal insufficiency (creatinine > 1.1 mg/dL or doubling of creatinine) pulmonary edema new onset cerebral or visual disturbances (amount of proteinuria is not a criterion for  severe preeclampsia) Antihypertensive therapy: therapy is indicated for severe hypertension: SBP > 160 DBP > 110   Antihypertensive agents for acute, severe hypertension: See section VI, C  Management of chronic hypertension with superimposed preeclampsia hospitalization for further evaluation monitor for signs/symptoms of severe disease (headaches, visual changes, RUQ/epigastric pain) daily labs: CBC, LFTs, creatinine  Antenatal Surveillance for superimposed preeclampsia with severe features Fetal movement counts: daily by patient  Ultrasound: screen for growth every 3 weeks after diagnosis  Antenatal testing: NST daily BPP 2x per week  Delivery timing Management same as preeclampsia with and without severe features (see section VI, G) Delivery can be delayed until [redacted]w[redacted]d, if no severe features   Seizure prophylaxis Magnesium sulfate is recommended for superimposed preeclampsia with severe features Continue magnesium sulfate during Cesarean delivery (i.e. do not stop intraoperatively)  Postpartum Management  Seizure prophylaxis continue magnesium sulfate for women with severe preeclampsia with severe features begin magnesium sulfate, if preeclampsia is diagnosed postpartum and: new onset hypertension with CNS symptoms preeclampsia with severe hypertension  Blood pressure monitoring Women with either preeclampsia or gestational hypertension should have BP monitoring: for 72 hours in hospital or equivalent outpatient assessment repeat BP assessment at 7-10 days postpartum repeat BP assessment earlier than 7-10 days, if symptoms are present  Begin anti-hypertensive therapy if: persistent SBP > 150 (persistent = 2 readings > 4 hours apart) persistent DBP > 100 Begin anti-hypertensive therapy within 1 hour if: SBP > 160 DBP > 110      Prevention  Low dose aspirin is effective in reducing risk of preeclampsia in high-risk women.  Begin low dose aspirin in late  first trimester (10-12 weeks) for women with any of following: history of early onset preeclampsia and preterm delivery < 34 weeks history of preeclampsia in > 1 prior pregnancy chronic hypertension diabetes    Monitoring for Late Cardiovascular complications  Women with a history of preeclampsia are at increased lifetime risk for cardiovascular disease. overall risk: 2-fold increase < 34 week preterm delivery: 8-9-fold increase  There are no proven strategies to decrease this risk, but the following are suggested by ACOG: Yearly assessment of: BP BMI lipids fasting glucose  Nursing care upon arrival/admission  Assess b/p using appropriate size cuff, do not lie patient on her  side. If b/p is > 160 and/or > 110: Recheck within 15 minutes If b/p remains >160 and/or >110, call provider to obtain Hypertension order set within 15 minutes Goal: administer antihypertensive medication within 60 minutes of verifying b/p >160 or >110.  Documentation (in addition to V/S, procedures, medications, etc.) Patient/family education regarding treatment, plan of care and discharge instructions. Inclusion in Care Plan.  References:  Theora Master., et al. (2017). National partnership for maternal safety: Consensus bundle on severe  hypertension during pregnancy and the postpartum period. Obstetrics and Gynecology (130)2, pp. [phone].  Executive summary: Hypertension in pregnancy. Obstet Gynecol 2013;122:1122-1131.  Revised: 08/09/16

## 2021-01-02 NOTE — MAU Note (Signed)
Pt reports to mau with c/o left sided pelvic pain that worsens when she moves for the past 2 days after starting walking for exercise daily.  Pt states she also feels the pain in her lower left back as well.  Reports she feels a burning sensation when at rest in her abd.  Denies bleeding or LOF.

## 2021-01-05 ENCOUNTER — Other Ambulatory Visit: Payer: Self-pay | Admitting: Obstetrics and Gynecology

## 2021-01-05 DIAGNOSIS — O139 Gestational [pregnancy-induced] hypertension without significant proteinuria, unspecified trimester: Secondary | ICD-10-CM | POA: Diagnosis not present

## 2021-01-05 LAB — GC/CHLAMYDIA PROBE AMP (~~LOC~~) NOT AT ARMC
Chlamydia: NEGATIVE
Comment: NEGATIVE
Comment: NORMAL
Neisseria Gonorrhea: NEGATIVE

## 2021-02-15 DIAGNOSIS — J069 Acute upper respiratory infection, unspecified: Secondary | ICD-10-CM | POA: Diagnosis not present

## 2021-02-15 DIAGNOSIS — R0603 Acute respiratory distress: Secondary | ICD-10-CM | POA: Diagnosis not present

## 2021-02-17 ENCOUNTER — Ambulatory Visit: Payer: Federal, State, Local not specified - PPO | Admitting: Cardiology

## 2021-02-19 DIAGNOSIS — Z3490 Encounter for supervision of normal pregnancy, unspecified, unspecified trimester: Secondary | ICD-10-CM | POA: Diagnosis not present

## 2021-02-19 DIAGNOSIS — Z3483 Encounter for supervision of other normal pregnancy, third trimester: Secondary | ICD-10-CM | POA: Diagnosis not present

## 2021-02-19 DIAGNOSIS — Z3482 Encounter for supervision of other normal pregnancy, second trimester: Secondary | ICD-10-CM | POA: Diagnosis not present

## 2021-03-02 DIAGNOSIS — Z23 Encounter for immunization: Secondary | ICD-10-CM | POA: Diagnosis not present

## 2021-03-19 DIAGNOSIS — O26843 Uterine size-date discrepancy, third trimester: Secondary | ICD-10-CM | POA: Diagnosis not present

## 2021-03-22 DIAGNOSIS — O133 Gestational [pregnancy-induced] hypertension without significant proteinuria, third trimester: Secondary | ICD-10-CM | POA: Diagnosis not present

## 2021-03-22 DIAGNOSIS — O99213 Obesity complicating pregnancy, third trimester: Secondary | ICD-10-CM | POA: Diagnosis not present

## 2021-04-01 DIAGNOSIS — O09523 Supervision of elderly multigravida, third trimester: Secondary | ICD-10-CM | POA: Diagnosis not present

## 2021-04-01 DIAGNOSIS — O99213 Obesity complicating pregnancy, third trimester: Secondary | ICD-10-CM | POA: Diagnosis not present

## 2021-04-01 DIAGNOSIS — O133 Gestational [pregnancy-induced] hypertension without significant proteinuria, third trimester: Secondary | ICD-10-CM | POA: Diagnosis not present

## 2021-04-02 ENCOUNTER — Other Ambulatory Visit: Payer: Self-pay | Admitting: Advanced Practice Midwife

## 2021-04-07 DIAGNOSIS — O99213 Obesity complicating pregnancy, third trimester: Secondary | ICD-10-CM | POA: Diagnosis not present

## 2021-04-07 DIAGNOSIS — O09523 Supervision of elderly multigravida, third trimester: Secondary | ICD-10-CM | POA: Diagnosis not present

## 2021-04-07 DIAGNOSIS — O133 Gestational [pregnancy-induced] hypertension without significant proteinuria, third trimester: Secondary | ICD-10-CM | POA: Diagnosis not present

## 2021-04-12 DIAGNOSIS — O99213 Obesity complicating pregnancy, third trimester: Secondary | ICD-10-CM | POA: Diagnosis not present

## 2021-04-12 DIAGNOSIS — O133 Gestational [pregnancy-induced] hypertension without significant proteinuria, third trimester: Secondary | ICD-10-CM | POA: Diagnosis not present

## 2021-04-12 DIAGNOSIS — O09523 Supervision of elderly multigravida, third trimester: Secondary | ICD-10-CM | POA: Diagnosis not present

## 2021-04-12 DIAGNOSIS — Z3490 Encounter for supervision of normal pregnancy, unspecified, unspecified trimester: Secondary | ICD-10-CM | POA: Diagnosis not present

## 2021-04-16 ENCOUNTER — Encounter (HOSPITAL_COMMUNITY): Payer: Self-pay

## 2021-04-16 NOTE — Patient Instructions (Addendum)
Kiara Hall  04/16/2021   Your procedure is scheduled on:  04/22/2021  Arrive at 1015 at Entrance C on [employer] at [facility]  and [employer]. You are invited to use the FREE valet parking or use the Visitor's parking deck.  Pick up the phone at the desk and dial [phone].  Call this number if you have problems the morning of surgery: [phone]  Remember:   Do not eat food:(After Midnight) Desps de medianoche.  Do not drink clear liquids: (After Midnight) Desps de medianoche.  Take these medicines the morning of surgery with A SIP OF WATER:  Take labetalol as prescribed   Do not wear jewelry, make-up or nail polish.  Do not wear lotions, powders, or perfumes. Do not wear deodorant.  Do not shave 48 hours prior to surgery.  Do not bring valuables to the hospital.  [facility] is not   responsible for any belongings or valuables brought to the hospital.  Contacts, dentures or bridgework may not be worn into surgery.  Leave suitcase in the car. After surgery it may be brought to your room.  For patients admitted to the hospital, checkout time is 11:00 AM the day of              discharge.      Please read over the following fact sheets that you were given:     Preparing for Surgery

## 2021-04-19 ENCOUNTER — Other Ambulatory Visit: Payer: Self-pay | Admitting: Obstetrics and Gynecology

## 2021-04-19 DIAGNOSIS — O133 Gestational [pregnancy-induced] hypertension without significant proteinuria, third trimester: Secondary | ICD-10-CM

## 2021-04-19 DIAGNOSIS — O99213 Obesity complicating pregnancy, third trimester: Secondary | ICD-10-CM | POA: Diagnosis not present

## 2021-04-19 DIAGNOSIS — O09523 Supervision of elderly multigravida, third trimester: Secondary | ICD-10-CM | POA: Diagnosis not present

## 2021-04-20 ENCOUNTER — Other Ambulatory Visit: Payer: Self-pay | Admitting: Obstetrics and Gynecology

## 2021-04-20 ENCOUNTER — Other Ambulatory Visit (HOSPITAL_COMMUNITY)
Admission: RE | Admit: 2021-04-20 | Discharge: 2021-04-20 | Disposition: A | Discharge status: Home / Self Care | Payer: Federal, State, Local not specified - PPO | Source: Ambulatory Visit | Attending: Obstetrics and Gynecology | Admitting: Obstetrics and Gynecology

## 2021-04-20 ENCOUNTER — Other Ambulatory Visit: Payer: Self-pay

## 2021-04-20 DIAGNOSIS — Z79899 Other long term (current) drug therapy: Secondary | ICD-10-CM | POA: Diagnosis not present

## 2021-04-20 DIAGNOSIS — O9081 Anemia of the puerperium: Secondary | ICD-10-CM | POA: Diagnosis not present

## 2021-04-20 DIAGNOSIS — Z01812 Encounter for preprocedural laboratory examination: Secondary | ICD-10-CM | POA: Insufficient documentation

## 2021-04-20 DIAGNOSIS — F411 Generalized anxiety disorder: Secondary | ICD-10-CM | POA: Diagnosis not present

## 2021-04-20 DIAGNOSIS — Z3A37 37 weeks gestation of pregnancy: Secondary | ICD-10-CM | POA: Diagnosis not present

## 2021-04-20 DIAGNOSIS — Z302 Encounter for sterilization: Secondary | ICD-10-CM | POA: Diagnosis not present

## 2021-04-20 DIAGNOSIS — O322XX0 Maternal care for transverse and oblique lie, not applicable or unspecified: Secondary | ICD-10-CM | POA: Diagnosis not present

## 2021-04-20 DIAGNOSIS — O99214 Obesity complicating childbirth: Secondary | ICD-10-CM | POA: Diagnosis not present

## 2021-04-20 DIAGNOSIS — O34219 Maternal care for unspecified type scar from previous cesarean delivery: Secondary | ICD-10-CM | POA: Diagnosis not present

## 2021-04-20 DIAGNOSIS — D62 Acute posthemorrhagic anemia: Secondary | ICD-10-CM | POA: Diagnosis not present

## 2021-04-20 DIAGNOSIS — O133 Gestational [pregnancy-induced] hypertension without significant proteinuria, third trimester: Secondary | ICD-10-CM

## 2021-04-20 DIAGNOSIS — O99344 Other mental disorders complicating childbirth: Secondary | ICD-10-CM | POA: Diagnosis not present

## 2021-04-20 DIAGNOSIS — O134 Gestational [pregnancy-induced] hypertension without significant proteinuria, complicating childbirth: Secondary | ICD-10-CM | POA: Diagnosis not present

## 2021-04-20 HISTORY — DX: Other complications of anesthesia, initial encounter: T88.59XA

## 2021-04-20 LAB — COMPREHENSIVE METABOLIC PANEL
ALT: 13 U/L (ref 0–44)
AST: 21 U/L (ref 15–41)
Albumin: 2.7 g/dL — ABNORMAL LOW (ref 3.5–5.0)
Alkaline Phosphatase: 83 U/L (ref 38–126)
Anion gap: 8 (ref 5–15)
BUN: 5 mg/dL — ABNORMAL LOW (ref 6–20)
CO2: 21 mmol/L — ABNORMAL LOW (ref 22–32)
Calcium: 8.8 mg/dL — ABNORMAL LOW (ref 8.9–10.3)
Chloride: 104 mmol/L (ref 98–111)
Creatinine, Ser: 0.49 mg/dL (ref 0.44–1.00)
GFR, Estimated: >60 mL/min (ref >60)
Glucose, Bld: 108 mg/dL — ABNORMAL HIGH (ref 70–99)
Potassium: 3.8 mmol/L (ref 3.5–5.1)
Sodium: 133 mmol/L — ABNORMAL LOW (ref 135–145)
Total Bilirubin: 1.1 mg/dL (ref 0.3–1.2)
Total Protein: 6.1 g/dL — ABNORMAL LOW (ref 6.5–8.1)

## 2021-04-20 LAB — CBC
HCT: 34.8 % — ABNORMAL LOW (ref 36.0–46.0)
Hemoglobin: 11.6 g/dL — ABNORMAL LOW (ref 12.0–15.0)
MCH: 27.4 pg (ref 26.0–34.0)
MCHC: 33.3 g/dL (ref 30.0–36.0)
MCV: 82.1 fL (ref 80.0–100.0)
Platelets: 144 10*3/uL — ABNORMAL LOW (ref 150–400)
RBC: 4.24 MIL/uL (ref 3.87–5.11)
RDW: 16 % — ABNORMAL HIGH (ref 11.5–15.5)
WBC: 3.6 10*3/uL — ABNORMAL LOW (ref 4.0–10.5)
nRBC: 0 % (ref 0.0–0.2)

## 2021-04-20 LAB — TYPE AND SCREEN
ABO/RH(D): O POS
Antibody Screen: NEGATIVE

## 2021-04-20 LAB — SARS CORONAVIRUS 2 (TAT 6-24 HRS): SARS Coronavirus 2: NEGATIVE

## 2021-04-20 NOTE — H&P (Signed)
Kiara Hall is a 40 y.o. female [phone] at 37 weeks and 0 days presenting for repeat cesarean section and bilateral salpingectomy due to gestational hypertension and desire for permanent sterilization. Pregnancy complicated by AMA/ Gestational hypertension / Anxiety/ Obesity/ transverse presentation head maternal right on u/s 04/19/2021. Prenatal care provided by Dr. Gerald Leitz with [facility]. OB History     Gravida  4   Para  2   Term  2   Preterm      AB  1   Living  2      SAB      IAB      Ectopic      Multiple  0   Live Births  2          Past Medical History:  Diagnosis Date   Anemia    Anxiety    Chicken pox    Complication of anesthesia    hypotension with one of the spinal   GERD (gastroesophageal reflux disease)    Palpitation    Palpitation    Past Surgical History:  Procedure Laterality Date   BREAST BIOPSY Left    benign    CESAREAN SECTION N/A 02/09/2015   Procedure: CESAREAN SECTION;  Surgeon: Gerald Leitz, MD;  Location: [facility]S;  Service: Obstetrics;  Laterality: N/A;   CESAREAN SECTION N/A 02/05/2019   Procedure: CESAREAN SECTION;  Surgeon: Gerald Leitz, MD;  Location: [facility] LD ORS;  Service: Obstetrics;  Laterality: N/A;   TONSILLECTOMY     TONSILLECTOMY     Family History: family history includes Diabetes in her mother; Heart disease (age of onset: 66) in her maternal uncle; Hypertension in her maternal aunt, maternal grandfather, and mother; Miscarriages / [place] in her sister; Stroke in her maternal grandmother. Social History:  reports that she has never smoked. She has never used smokeless tobacco. She reports current alcohol use. She reports that she does not use drugs.     Maternal Diabetes: No Genetic Screening: Normal Maternal Ultrasounds/Referrals: Normal Fetal Ultrasounds or other Referrals:  None Maternal Substance Abuse:  No Significant Maternal Medications:  Meds include: Other:  Significant Maternal Lab Results:   Group B Strep negative Other Comments:  None  Review of Systems  Constitutional: Negative.   HENT: Negative.    Eyes: Negative.   Respiratory: Negative.    Cardiovascular: Negative.   Gastrointestinal: Negative.   Endocrine: Negative.   Genitourinary: Negative.   Musculoskeletal: Negative.   Skin: Negative.   Allergic/Immunologic: Negative.   Neurological: Negative.   Hematological: Negative.   Psychiatric/Behavioral:  The patient is nervous/anxious.   History   Last menstrual period 08/06/2020. Maternal Exam:  Introitus: Normal vulva.  Physical Exam Vitals reviewed.  Constitutional:      Appearance: Normal appearance.  HENT:     Head: Normocephalic and atraumatic.     Nose: Nose normal.     Mouth/Throat:     Mouth: Mucous membranes are moist.  Eyes:     Pupils: Pupils are equal, round, and reactive to light.  Cardiovascular:     Rate and Rhythm: Normal rate and regular rhythm.  Pulmonary:     Effort: Pulmonary effort is normal.     Breath sounds: Normal breath sounds.  Abdominal:     Tenderness: There is no abdominal tenderness.  Genitourinary:    General: Normal vulva.  Musculoskeletal:        General: Swelling present. Normal range of motion.     Cervical back: Normal range  of motion and neck supple.  Neurological:     General: No focal deficit present.     Mental Status: She is alert and oriented to person, place, and time.  Psychiatric:        Mood and Affect: Mood normal.        Behavior: Behavior normal.    Prenatal labs: ABO, Rh: --/--/PENDING (12/20 1324) Antibody: PENDING (12/20 4010) Rubella: Immune (06/06 0000) RPR: Nonreactive (06/06 0000)  HBsAg: Negative (06/06 0000)  HIV: Non-reactive (06/06 0000)  GBS:   Negative on 04/12/2021  Assessment/Plan: 37 weeks and 0 days with gestational hypertension. H/O cesarean section x 2 and desire for permanent sterilization via bilateral salpingectomy  Recommend cesarean section. R/B/A of cesarean  section  and bilateral salpingectomy discussed with the patient including but not limited to infection, bleeding damage to bowel bladder and baby with the need for further surgery. R/O transfusion HIV/ Hep B&C discussed. Pt voiced understanding and desires to proceed with cesarean section.    Gestational hypertension -  will check PIH labs... continue labetalol 100 mg twice a day  Generalized anxiety continue buspar 7.5 mg twice a day  Gerald Leitz 04/20/2021, 10:38 AM

## 2021-04-20 NOTE — H&P (Deleted)
  The note originally documented on this encounter has been moved the the encounter in which it belongs.  

## 2021-04-21 LAB — RPR: RPR Ser Ql: NONREACTIVE

## 2021-04-22 ENCOUNTER — Inpatient Hospital Stay (HOSPITAL_COMMUNITY)
Admission: RE | Admit: 2021-04-22 | Discharge: 2021-04-25 | DRG: 784 | Disposition: A | Discharge status: Home / Self Care | Payer: Federal, State, Local not specified - PPO | Attending: Obstetrics and Gynecology | Admitting: Obstetrics and Gynecology

## 2021-04-22 ENCOUNTER — Inpatient Hospital Stay (HOSPITAL_COMMUNITY): Payer: Federal, State, Local not specified - PPO | Admitting: Anesthesiology

## 2021-04-22 ENCOUNTER — Encounter (HOSPITAL_COMMUNITY)
Admission: RE | Disposition: A | Discharge status: Home / Self Care | Payer: Self-pay | Source: Home / Self Care | Attending: Obstetrics and Gynecology

## 2021-04-22 ENCOUNTER — Other Ambulatory Visit: Payer: Self-pay

## 2021-04-22 ENCOUNTER — Encounter (HOSPITAL_COMMUNITY): Payer: Self-pay | Admitting: Obstetrics and Gynecology

## 2021-04-22 DIAGNOSIS — Z3A00 Weeks of gestation of pregnancy not specified: Secondary | ICD-10-CM | POA: Diagnosis not present

## 2021-04-22 DIAGNOSIS — O134 Gestational [pregnancy-induced] hypertension without significant proteinuria, complicating childbirth: Principal | ICD-10-CM | POA: Diagnosis present

## 2021-04-22 DIAGNOSIS — O34219 Maternal care for unspecified type scar from previous cesarean delivery: Secondary | ICD-10-CM | POA: Diagnosis not present

## 2021-04-22 DIAGNOSIS — Z302 Encounter for sterilization: Secondary | ICD-10-CM

## 2021-04-22 DIAGNOSIS — D62 Acute posthemorrhagic anemia: Secondary | ICD-10-CM | POA: Diagnosis not present

## 2021-04-22 DIAGNOSIS — O139 Gestational [pregnancy-induced] hypertension without significant proteinuria, unspecified trimester: Secondary | ICD-10-CM | POA: Diagnosis present

## 2021-04-22 DIAGNOSIS — O99344 Other mental disorders complicating childbirth: Secondary | ICD-10-CM | POA: Diagnosis present

## 2021-04-22 DIAGNOSIS — F411 Generalized anxiety disorder: Secondary | ICD-10-CM | POA: Diagnosis present

## 2021-04-22 DIAGNOSIS — O9081 Anemia of the puerperium: Secondary | ICD-10-CM | POA: Diagnosis not present

## 2021-04-22 DIAGNOSIS — Z79899 Other long term (current) drug therapy: Secondary | ICD-10-CM

## 2021-04-22 DIAGNOSIS — O322XX0 Maternal care for transverse and oblique lie, not applicable or unspecified: Secondary | ICD-10-CM | POA: Diagnosis present

## 2021-04-22 DIAGNOSIS — Z98891 History of uterine scar from previous surgery: Secondary | ICD-10-CM

## 2021-04-22 DIAGNOSIS — O99214 Obesity complicating childbirth: Secondary | ICD-10-CM | POA: Diagnosis present

## 2021-04-22 DIAGNOSIS — Z8759 Personal history of other complications of pregnancy, childbirth and the puerperium: Secondary | ICD-10-CM | POA: Diagnosis present

## 2021-04-22 DIAGNOSIS — Z3A37 37 weeks gestation of pregnancy: Secondary | ICD-10-CM

## 2021-04-22 LAB — CBC
HCT: 33.9 % — ABNORMAL LOW (ref 36.0–46.0)
HCT: 30.4 % — ABNORMAL LOW (ref 36.0–46.0)
Hemoglobin: 11.2 g/dL — ABNORMAL LOW (ref 12.0–15.0)
Hemoglobin: 10.2 g/dL — ABNORMAL LOW (ref 12.0–15.0)
MCH: 27.3 pg (ref 26.0–34.0)
MCH: 27.7 pg (ref 26.0–34.0)
MCHC: 33 g/dL (ref 30.0–36.0)
MCHC: 33.6 g/dL (ref 30.0–36.0)
MCV: 82.5 fL (ref 80.0–100.0)
MCV: 82.6 fL (ref 80.0–100.0)
Platelets: 128 10*3/uL — ABNORMAL LOW (ref 150–400)
Platelets: 140 10*3/uL — ABNORMAL LOW (ref 150–400)
RBC: 4.11 MIL/uL (ref 3.87–5.11)
RBC: 3.68 MIL/uL — ABNORMAL LOW (ref 3.87–5.11)
RDW: 16.1 % — ABNORMAL HIGH (ref 11.5–15.5)
RDW: 16.1 % — ABNORMAL HIGH (ref 11.5–15.5)
WBC: 5.5 10*3/uL (ref 4.0–10.5)
WBC: 11 10*3/uL — ABNORMAL HIGH (ref 4.0–10.5)
nRBC: 0 % (ref 0.0–0.2)
nRBC: 0 % (ref 0.0–0.2)

## 2021-04-22 SURGERY — Surgical Case
Anesthesia: Spinal | Site: Abdomen | Laterality: Bilateral | Wound class: Clean Contaminated

## 2021-04-22 MED ORDER — LACTATED RINGERS IV BOLUS: 500.0000 mL | Freq: Once | INTRAVENOUS | Status: DC

## 2021-04-22 MED ORDER — ACETAMINOPHEN 10 MG/ML IV SOLN
INTRAVENOUS | Status: DC | PRN
  Administered 2021-04-22: 1000 mg via INTRAVENOUS

## 2021-04-22 MED ORDER — LACTATED RINGERS IV SOLN: INTRAVENOUS | Status: DC

## 2021-04-22 MED ORDER — CEFAZOLIN SODIUM-DEXTROSE 2-4 GM/100ML-% IV SOLN
2.0000 g | INTRAVENOUS | Status: AC
  Administered 2021-04-22: 13:00:00 2 g via INTRAVENOUS

## 2021-04-22 MED ORDER — DIPHENHYDRAMINE HCL 50 MG/ML IJ SOLN: 12.5000 mg | INTRAMUSCULAR | Status: DC | PRN

## 2021-04-22 MED ORDER — KETOROLAC TROMETHAMINE 30 MG/ML IJ SOLN: 30.0000 mg | Freq: Four times a day (QID) | INTRAMUSCULAR | Status: AC | PRN

## 2021-04-22 MED ORDER — FENTANYL CITRATE (PF) 100 MCG/2ML IJ SOLN: 25.0000 ug | INTRAMUSCULAR | Status: DC | PRN

## 2021-04-22 MED ORDER — OXYCODONE HCL 5 MG PO TABS
5.0000 mg | ORAL_TABLET | ORAL | Status: DC | PRN
  Administered 2021-04-23: 21:00:00 10 mg via ORAL
  Administered 2021-04-24: 15:00:00 5 mg via ORAL
  Filled 2021-04-22: qty 2
  Filled 2021-04-22: qty 1

## 2021-04-22 MED ORDER — FENTANYL CITRATE (PF) 100 MCG/2ML IJ SOLN
INTRAMUSCULAR | Status: AC
  Filled 2021-04-22: qty 2

## 2021-04-22 MED ORDER — MIDAZOLAM HCL 2 MG/2ML IJ SOLN
INTRAMUSCULAR | Status: AC
  Filled 2021-04-22: qty 2

## 2021-04-22 MED ORDER — WITCH HAZEL-GLYCERIN EX PADS: 1.0000 "application " | MEDICATED_PAD | CUTANEOUS | Status: DC | PRN

## 2021-04-22 MED ORDER — PHENYLEPHRINE HCL-NACL 20-0.9 MG/250ML-% IV SOLN
INTRAVENOUS | Status: DC | PRN
Start: 2021-04-22 — End: 2021-04-22
  Administered 2021-04-22: 100 ug/min via INTRAVENOUS

## 2021-04-22 MED ORDER — OXYTOCIN-SODIUM CHLORIDE 30-0.9 UT/500ML-% IV SOLN
2.5000 [IU]/h | INTRAVENOUS | Status: AC
  Administered 2021-04-22: 17:00:00 2.5 [IU]/h via INTRAVENOUS
  Filled 2021-04-22: qty 500

## 2021-04-22 MED ORDER — SODIUM CHLORIDE 0.9% FLUSH: 3.0000 mL | INTRAVENOUS | Status: DC | PRN

## 2021-04-22 MED ORDER — MORPHINE SULFATE (PF) 0.5 MG/ML IJ SOLN
INTRAMUSCULAR | Status: DC | PRN
  Administered 2021-04-22: .15 mg via INTRATHECAL

## 2021-04-22 MED ORDER — CEFAZOLIN IN SODIUM CHLORIDE 3-0.9 GM/100ML-% IV SOLN
INTRAVENOUS | Status: AC
  Filled 2021-04-22: qty 100

## 2021-04-22 MED ORDER — SIMETHICONE 80 MG PO CHEW
80.0000 mg | CHEWABLE_TABLET | ORAL | Status: DC | PRN
  Administered 2021-04-24 – 2021-04-25 (×3): 80 mg via ORAL
  Filled 2021-04-22 (×3): qty 1

## 2021-04-22 MED ORDER — NALOXONE HCL 4 MG/10ML IJ SOLN
1.0000 ug/kg/h | INTRAVENOUS | Status: DC | PRN
  Filled 2021-04-22: qty 5

## 2021-04-22 MED ORDER — ACETAMINOPHEN 10 MG/ML IV SOLN
INTRAVENOUS | Status: AC
  Filled 2021-04-22: qty 100

## 2021-04-22 MED ORDER — PROPOFOL 10 MG/ML IV BOLUS
INTRAVENOUS | Status: AC
  Filled 2021-04-22: qty 20

## 2021-04-22 MED ORDER — BUPIVACAINE IN DEXTROSE 0.75-8.25 % IT SOLN
INTRATHECAL | Status: DC | PRN
  Administered 2021-04-22: 1.4 mL via INTRATHECAL

## 2021-04-22 MED ORDER — NALOXONE HCL 0.4 MG/ML IJ SOLN: 0.4000 mg | INTRAMUSCULAR | Status: DC | PRN

## 2021-04-22 MED ORDER — MORPHINE SULFATE (PF) 0.5 MG/ML IJ SOLN
INTRAMUSCULAR | Status: AC
  Filled 2021-04-22: qty 10

## 2021-04-22 MED ORDER — FENTANYL CITRATE (PF) 100 MCG/2ML IJ SOLN
INTRAMUSCULAR | Status: DC | PRN
  Administered 2021-04-22: 15 ug via INTRATHECAL

## 2021-04-22 MED ORDER — PRENATAL 27-0.8 MG PO TABS: 1.0000 | ORAL_TABLET | Freq: Every day | ORAL | Status: DC

## 2021-04-22 MED ORDER — STERILE WATER FOR IRRIGATION IR SOLN
Status: DC | PRN
  Administered 2021-04-22: 1000 mL

## 2021-04-22 MED ORDER — LACTATED RINGERS IV SOLN
INTRAVENOUS | Status: DC | PRN
Start: 2021-04-22 — End: 2021-04-22

## 2021-04-22 MED ORDER — TRANEXAMIC ACID-NACL 1000-0.7 MG/100ML-% IV SOLN
INTRAVENOUS | Status: AC
  Filled 2021-04-22: qty 100

## 2021-04-22 MED ORDER — SOD CITRATE-CITRIC ACID 500-334 MG/5ML PO SOLN
30.0000 mL | ORAL | Status: AC
  Administered 2021-04-22: 11:00:00 30 mL via ORAL

## 2021-04-22 MED ORDER — DEXAMETHASONE SODIUM PHOSPHATE 4 MG/ML IJ SOLN
INTRAMUSCULAR | Status: AC
  Filled 2021-04-22: qty 1

## 2021-04-22 MED ORDER — FERROUS SULFATE 325 (65 FE) MG PO TABS
325.0000 mg | ORAL_TABLET | Freq: Every day | ORAL | Status: DC
  Administered 2021-04-23 – 2021-04-25 (×3): 325 mg via ORAL
  Filled 2021-04-22 (×3): qty 1

## 2021-04-22 MED ORDER — DIPHENHYDRAMINE HCL 25 MG PO CAPS: 25.0000 mg | ORAL_CAPSULE | Freq: Four times a day (QID) | ORAL | Status: DC | PRN

## 2021-04-22 MED ORDER — DIBUCAINE (PERIANAL) 1 % EX OINT: 1.0000 "application " | TOPICAL_OINTMENT | CUTANEOUS | Status: DC | PRN

## 2021-04-22 MED ORDER — ONDANSETRON HCL 4 MG/2ML IJ SOLN: 4.0000 mg | Freq: Three times a day (TID) | INTRAMUSCULAR | Status: DC | PRN

## 2021-04-22 MED ORDER — DEXAMETHASONE SODIUM PHOSPHATE 4 MG/ML IJ SOLN
INTRAMUSCULAR | Status: DC | PRN
  Administered 2021-04-22: 10 mg via INTRAVENOUS

## 2021-04-22 MED ORDER — PHENYLEPHRINE HCL-NACL 20-0.9 MG/250ML-% IV SOLN
INTRAVENOUS | Status: AC
  Filled 2021-04-22: qty 250

## 2021-04-22 MED ORDER — EPHEDRINE SULFATE 50 MG/ML IJ SOLN
INTRAMUSCULAR | Status: DC | PRN
  Administered 2021-04-22: 25 mg via INTRAVENOUS
  Administered 2021-04-22: 5 mg via INTRAVENOUS
  Administered 2021-04-22: 20 mg via INTRAVENOUS
  Administered 2021-04-22: 10 mg via INTRAVENOUS

## 2021-04-22 MED ORDER — MENTHOL 3 MG MT LOZG: 1.0000 | LOZENGE | OROMUCOSAL | Status: DC | PRN

## 2021-04-22 MED ORDER — SOD CITRATE-CITRIC ACID 500-334 MG/5ML PO SOLN
ORAL | Status: AC
  Filled 2021-04-22: qty 30

## 2021-04-22 MED ORDER — PRENATAL MULTIVITAMIN CH
1.0000 | ORAL_TABLET | Freq: Every day | ORAL | Status: DC
  Administered 2021-04-23 – 2021-04-25 (×3): 1 via ORAL
  Filled 2021-04-22 (×3): qty 1

## 2021-04-22 MED ORDER — KETOROLAC TROMETHAMINE 30 MG/ML IJ SOLN
INTRAMUSCULAR | Status: AC
  Filled 2021-04-22: qty 1

## 2021-04-22 MED ORDER — DIPHENHYDRAMINE HCL 25 MG PO CAPS: 25.0000 mg | ORAL_CAPSULE | ORAL | Status: DC | PRN

## 2021-04-22 MED ORDER — TRANEXAMIC ACID-NACL 1000-0.7 MG/100ML-% IV SOLN
INTRAVENOUS | Status: DC | PRN
  Administered 2021-04-22: 1000 mg via INTRAVENOUS

## 2021-04-22 MED ORDER — OXYTOCIN-SODIUM CHLORIDE 30-0.9 UT/500ML-% IV SOLN
INTRAVENOUS | Status: AC
  Filled 2021-04-22: qty 500

## 2021-04-22 MED ORDER — SENNOSIDES-DOCUSATE SODIUM 8.6-50 MG PO TABS
2.0000 | ORAL_TABLET | Freq: Every day | ORAL | Status: DC
  Administered 2021-04-23 – 2021-04-25 (×3): 2 via ORAL
  Filled 2021-04-22 (×3): qty 2

## 2021-04-22 MED ORDER — PHENYLEPHRINE 40 MCG/ML (10ML) SYRINGE FOR IV PUSH (FOR BLOOD PRESSURE SUPPORT)
PREFILLED_SYRINGE | INTRAVENOUS | Status: DC | PRN
  Administered 2021-04-22 (×2): 80 ug via INTRAVENOUS
  Administered 2021-04-22 (×3): 200 ug via INTRAVENOUS
  Administered 2021-04-22: 160 ug via INTRAVENOUS
  Administered 2021-04-22: 200 ug via INTRAVENOUS
  Administered 2021-04-22: 160 ug via INTRAVENOUS
  Administered 2021-04-22 (×2): 200 ug via INTRAVENOUS
  Administered 2021-04-22: 160 ug via INTRAVENOUS

## 2021-04-22 MED ORDER — ACETAMINOPHEN 500 MG PO TABS
1000.0000 mg | ORAL_TABLET | Freq: Four times a day (QID) | ORAL | Status: DC
  Administered 2021-04-22 – 2021-04-25 (×10): 1000 mg via ORAL
  Filled 2021-04-22 (×12): qty 2

## 2021-04-22 MED ORDER — LABETALOL HCL 100 MG PO TABS
100.0000 mg | ORAL_TABLET | Freq: Two times a day (BID) | ORAL | Status: DC
  Administered 2021-04-22: 21:00:00 100 mg via ORAL
  Filled 2021-04-22 (×2): qty 1

## 2021-04-22 MED ORDER — SUCCINYLCHOLINE CHLORIDE 200 MG/10ML IV SOSY
PREFILLED_SYRINGE | INTRAVENOUS | Status: AC
  Filled 2021-04-22: qty 10

## 2021-04-22 MED ORDER — OXYTOCIN-SODIUM CHLORIDE 30-0.9 UT/500ML-% IV SOLN
INTRAVENOUS | Status: DC | PRN
Start: 2021-04-22 — End: 2021-04-22
  Administered 2021-04-22: 300 mL via INTRAVENOUS
  Administered 2021-04-22: 100 mL via INTRAVENOUS

## 2021-04-22 MED ORDER — IBUPROFEN 600 MG PO TABS
600.0000 mg | ORAL_TABLET | Freq: Four times a day (QID) | ORAL | Status: AC
  Administered 2021-04-22 – 2021-04-25 (×12): 600 mg via ORAL
  Filled 2021-04-22 (×12): qty 1

## 2021-04-22 MED ORDER — FLUTICASONE PROPIONATE 50 MCG/ACT NA SUSP
1.0000 | Freq: Every day | NASAL | Status: DC | PRN
  Filled 2021-04-22 (×2): qty 16

## 2021-04-22 MED ORDER — FERROUS SULFATE 325 (65 FE) MG PO TABS: 325.0000 mg | ORAL_TABLET | Freq: Two times a day (BID) | ORAL | Status: DC

## 2021-04-22 MED ORDER — ONDANSETRON HCL 4 MG/2ML IJ SOLN
INTRAMUSCULAR | Status: AC
  Filled 2021-04-22: qty 2

## 2021-04-22 MED ORDER — COCONUT OIL OIL
1.0000 "application " | TOPICAL_OIL | Status: DC | PRN
  Administered 2021-04-24: 1 via TOPICAL

## 2021-04-22 MED ORDER — LORATADINE 10 MG PO TABS
10.0000 mg | ORAL_TABLET | Freq: Every day | ORAL | Status: DC | PRN
  Administered 2021-04-23: 13:00:00 10 mg via ORAL
  Filled 2021-04-22: qty 1

## 2021-04-22 MED ORDER — POVIDONE-IODINE 10 % EX SWAB
2.0000 "application " | Freq: Once | CUTANEOUS | Status: AC
  Administered 2021-04-22: 2 via TOPICAL

## 2021-04-22 MED ORDER — HYDROMORPHONE HCL 1 MG/ML IJ SOLN: 0.2000 mg | INTRAMUSCULAR | Status: DC | PRN

## 2021-04-22 MED ORDER — EPHEDRINE 5 MG/ML INJ
INTRAVENOUS | Status: AC
  Filled 2021-04-22: qty 10

## 2021-04-22 MED ORDER — SODIUM CHLORIDE 0.9 % IR SOLN
Status: DC | PRN
  Administered 2021-04-22: 1000 mL

## 2021-04-22 MED ORDER — ACETAMINOPHEN 10 MG/ML IV SOLN: 1000.0000 mg | Freq: Once | INTRAVENOUS | Status: DC | PRN

## 2021-04-22 MED ORDER — SCOPOLAMINE 1 MG/3DAYS TD PT72: 1.0000 | MEDICATED_PATCH | Freq: Once | TRANSDERMAL | Status: DC

## 2021-04-22 MED ORDER — ACETAMINOPHEN 500 MG PO TABS: 1000.0000 mg | ORAL_TABLET | Freq: Four times a day (QID) | ORAL | Status: DC

## 2021-04-22 MED ORDER — PHENYLEPHRINE 40 MCG/ML (10ML) SYRINGE FOR IV PUSH (FOR BLOOD PRESSURE SUPPORT)
PREFILLED_SYRINGE | INTRAVENOUS | Status: AC
  Filled 2021-04-22: qty 30

## 2021-04-22 MED ORDER — ZOLPIDEM TARTRATE 5 MG PO TABS: 5.0000 mg | ORAL_TABLET | Freq: Every evening | ORAL | Status: DC | PRN

## 2021-04-22 MED ORDER — DEXAMETHASONE SODIUM PHOSPHATE 10 MG/ML IJ SOLN
INTRAMUSCULAR | Status: AC
  Filled 2021-04-22: qty 3

## 2021-04-22 MED ORDER — ONDANSETRON HCL 4 MG/2ML IJ SOLN
INTRAMUSCULAR | Status: DC | PRN
  Administered 2021-04-22: 4 mg via INTRAVENOUS

## 2021-04-22 MED ORDER — KETOROLAC TROMETHAMINE 30 MG/ML IJ SOLN: 30.0000 mg | Freq: Once | INTRAMUSCULAR | Status: DC | PRN

## 2021-04-22 MED ORDER — CEFAZOLIN SODIUM-DEXTROSE 2-4 GM/100ML-% IV SOLN
INTRAVENOUS | Status: AC
  Filled 2021-04-22: qty 100

## 2021-04-22 MED ORDER — SIMETHICONE 80 MG PO CHEW
80.0000 mg | CHEWABLE_TABLET | Freq: Three times a day (TID) | ORAL | Status: DC
  Administered 2021-04-22 – 2021-04-25 (×7): 80 mg via ORAL
  Filled 2021-04-22 (×8): qty 1

## 2021-04-22 SURGICAL SUPPLY — 47 items
APL SKNCLS STERI-STRIP NONHPOA (GAUZE/BANDAGES/DRESSINGS) ×1
BARRIER ADHS 3X4 INTERCEED (GAUZE/BANDAGES/DRESSINGS) IMPLANT
BENZOIN TINCTURE PRP APPL 2/3 (GAUZE/BANDAGES/DRESSINGS) ×3 IMPLANT
BRR ADH 4X3 ABS CNTRL BYND (GAUZE/BANDAGES/DRESSINGS)
CHLORAPREP W/TINT 26ML (MISCELLANEOUS) ×3 IMPLANT
CLAMP CORD UMBIL (MISCELLANEOUS) IMPLANT
CLOSURE STERI STRIP 1/2 X4 (GAUZE/BANDAGES/DRESSINGS) ×1 IMPLANT
CLOSURE WOUND 1/2 X4 (GAUZE/BANDAGES/DRESSINGS) ×1
CLOTH BEACON ORANGE TIMEOUT ST (SAFETY) ×3 IMPLANT
DRSG OPSITE POSTOP 4X10 (GAUZE/BANDAGES/DRESSINGS) ×3 IMPLANT
ELECT REM PT RETURN 9FT ADLT (ELECTROSURGICAL) ×3
ELECTRODE REM PT RTRN 9FT ADLT (ELECTROSURGICAL) ×1 IMPLANT
EXTRACTOR VACUUM KIWI (MISCELLANEOUS) ×4 IMPLANT
GAUZE SPONGE 4X4 12PLY STRL LF (GAUZE/BANDAGES/DRESSINGS) ×4 IMPLANT
GLOVE BIOGEL M 7.0 STRL (GLOVE) ×6 IMPLANT
GLOVE BIOGEL PI IND STRL 7.0 (GLOVE) ×3 IMPLANT
GLOVE BIOGEL PI INDICATOR 7.0 (GLOVE) ×6
GOWN STRL REUS W/TWL LRG LVL3 (GOWN DISPOSABLE) ×9 IMPLANT
HEMOSTAT ARISTA ABSORB 3G PWDR (HEMOSTASIS) ×4 IMPLANT
HEMOSTAT SURGICEL 2X14 (HEMOSTASIS) ×2 IMPLANT
KIT ABG SYR 3ML LUER SLIP (SYRINGE) IMPLANT
LIGASURE IMPACT 36 18CM CVD LR (INSTRUMENTS) ×2 IMPLANT
NDL HYPO 25X5/8 SAFETYGLIDE (NEEDLE) IMPLANT
NEEDLE HYPO 25X5/8 SAFETYGLIDE (NEEDLE) IMPLANT
NS IRRIG 1000ML POUR BTL (IV SOLUTION) ×3 IMPLANT
PACK C SECTION WH (CUSTOM PROCEDURE TRAY) ×3 IMPLANT
PAD ABD 8X10 STRL (GAUZE/BANDAGES/DRESSINGS) ×2 IMPLANT
PAD OB MATERNITY 4.3X12.25 (PERSONAL CARE ITEMS) ×3 IMPLANT
PENCIL SMOKE EVAC W/HOLSTER (ELECTROSURGICAL) ×3 IMPLANT
RETRACTOR TRAXI PANNICULUS (MISCELLANEOUS) IMPLANT
RTRCTR C-SECT PINK 25CM LRG (MISCELLANEOUS) IMPLANT
SPONGE LAP 18X18 X RAY DECT (DISPOSABLE) ×8 IMPLANT
STRIP CLOSURE SKIN 1/2X4 (GAUZE/BANDAGES/DRESSINGS) ×2 IMPLANT
SUT MNCRL 0 VIOLET CTX 36 (SUTURE) ×2 IMPLANT
SUT MONOCRYL 0 CTX 36 (SUTURE) ×6
SUT PDS AB 0 CT1 27 (SUTURE) ×6 IMPLANT
SUT PLAIN 0 NONE (SUTURE) IMPLANT
SUT VIC AB 2-0 CT1 27 (SUTURE) ×12
SUT VIC AB 2-0 CT1 TAPERPNT 27 (SUTURE) ×1 IMPLANT
SUT VIC AB 3-0 SH 27 (SUTURE)
SUT VIC AB 3-0 SH 27X BRD (SUTURE) IMPLANT
SUT VIC AB 4-0 KS 27 (SUTURE) ×3 IMPLANT
SYR BULB IRRIG 60ML STRL (SYRINGE) ×2 IMPLANT
TOWEL OR 17X24 6PK STRL BLUE (TOWEL DISPOSABLE) ×3 IMPLANT
TRAXI PANNICULUS RETRACTOR (MISCELLANEOUS) ×2
TRAY FOLEY W/BAG SLVR 14FR LF (SET/KITS/TRAYS/PACK) ×3 IMPLANT
WATER STERILE IRR 1000ML POUR (IV SOLUTION) ×3 IMPLANT

## 2021-04-22 NOTE — Progress Notes (Addendum)
The night shift Rn called Dr Henderson Cloud regarding patient's blood pressures of 135/80,131/75  and 126/75 when lying , sitting and standing. The Rn reported to Dr Henderson Cloud the Patient's heart rate usually is in the 100's. But when standing heart rate was 153.  The Patient was a Csection from today (22nd) at 13:28. The Patient's EBL was 1575,. The patient was given TXA in the  OR.  The Patient's uterus has been two above since surgery per report from day shift Rn. The Rn made Dr Henderson Cloud aware of this. A CBC was done after surgery , the patient's Hemoglobin was 11.2. Dr Henderson Cloud ordered a 500cc bolus of Lactated RIngers and ordered a repeat CBC stat. The Doctor stated to call if Hemoglobin was below 8 or if heart rate was over 150.  In addition, parameters were given for blood pressures, which are equal to 160/110 or higher.    The Rn took the patient on the stedy to the bathroom , the patient had  no dizziness. The Patient 's bleeding is small.  The patient has a history of gestational hypertension and is on labetalol 100 mg.

## 2021-04-22 NOTE — Anesthesia Postprocedure Evaluation (Signed)
Anesthesia Post Note  Patient: Kiara Hall  Procedure(s) Performed: REPEAT CESAREAN SECTION WITH BILATERAL SALPINGECTOMY (Bilateral: Abdomen)     Patient location during evaluation: PACU Anesthesia Type: Spinal Level of consciousness: oriented and awake and alert Pain management: pain level controlled Vital Signs Assessment: post-procedure vital signs reviewed and stable Respiratory status: spontaneous breathing, respiratory function stable and patient connected to nasal cannula oxygen Cardiovascular status: blood pressure returned to baseline and stable Postop Assessment: no headache, no backache and no apparent nausea or vomiting Anesthetic complications: no   No notable events documented.  Last Vitals:  Vitals:   04/22/21 1551 04/22/21 1640  BP: 126/75 136/71  Pulse: (!) 109 (!) 109  Resp: 20 20  Temp: 37.3 C 36.6 C  SpO2: 97% 98%    Last Pain:  Vitals:   04/22/21 1640  TempSrc: Oral  PainSc:    Pain Goal:                   Jaiyden Laur L Adda Stokes

## 2021-04-22 NOTE — Op Note (Signed)
Cesarean Section with bilateral salpingectomy  Procedure Note  Indications:  history of cearean section x 2 with gestational hypertension at 37 weeks and 0 days. Patient also desires permanent sterilization.   Pre-operative Diagnosis: 37 week 0 day pregnancy.  Post-operative Diagnosis: same  Surgeon: Gerald Leitz M.D.  Assistants: Steva Ready M.D.  Anesthesia: Spinal anesthesia  ASA Class: 2   Procedure Details   The patient was seen in the Holding Room. The risks, benefits, complications, treatment options, and expected outcomes were discussed with the patient.  The patient concurred with the proposed plan, giving informed consent.  The site of surgery properly noted/marked. The patient was taken to Operating Room # C, identified as Kiara Hall and the procedure verified as C-Section Delivery. A Time Out was held and the above information confirmed.  After induction of anesthesia, the patient was draped and prepped in the usual sterile manner. A Pfannenstiel incision was made and carried down through the subcutaneous tissue to the fascia. Fascial incision was made and extended transversely. The fascia was separated from the underlying rectus tissue superiorly and inferiorly. The peritoneum was identified and entered. Pt ws noted to have dense adhesions of the uterus to the anterior pelvic wall. These were released with bovie. A low transverse uterine incision was made. Delivered from cephalic presentation was a 3400 gram Female with Apgar scores of 7 at one minute and 9 at five minutes. After the umbilical cord was clamped and cut cord blood was obtained for evaluation. The placenta was removed intact and appeared normal. The uterine outline, tubes and ovaries appeared normal. The uterine incision was closed with running locked sutures of  0 monocryl. A second layer of 0 monocryl was used to imbricate the incision.  . . Hemostasis was observed.   Attention was turned to the left  fallopian tube which was grasped with a babcock clamp the liga-sure was used to cauterize and excise the fallopian tube along the mesosalpinx. This was repeat on the right fallopian tube. Lavage was carried out until clear. Surgicel was placed along the uterine incision for added hemostasis... Arista was placed along the rectus muscles.  The fascia was then reapproximated with running sutures of  0 pds . The skin was reapproximated with  4-0 vicryl  .  Instrument, sponge, and needle counts were correct prior the abdominal closure and at the conclusion of the case.   Findings: Female infant in cephalic presentation nuchal cord x 1.. dense adhesions of the uterus to the anterior abdominal wall.. normal appearing fallopian tubes and ovaries.   Estimated Blood Loss:   1100         Drains: None         Total IV Fluids:  per anesthesia ml         Specimens: Fallopian Tube and Disposition:  Sent to Pathology          Implants: None         Complications:  None; patient tolerated the procedure well.         Disposition: PACU - hemodynamically stable.         Condition: stable  Attending Attestation: I performed the procedure.

## 2021-04-22 NOTE — Anesthesia Procedure Notes (Signed)
Spinal  Patient location during procedure: OR Start time: 04/22/2021 12:45 PM End time: 04/22/2021 12:49 PM Reason for block: surgical anesthesia Staffing Performed: anesthesiologist  Anesthesiologist: Elmer Picker, MD Preanesthetic Checklist Completed: patient identified, IV checked, risks and benefits discussed, surgical consent, monitors and equipment checked, pre-op evaluation and timeout performed Spinal Block Patient position: sitting Prep: DuraPrep and site prepped and draped Patient monitoring: cardiac monitor, continuous pulse ox and blood pressure Approach: midline Location: L3-4 Injection technique: single-shot Needle Needle type: Pencan  Needle gauge: 24 G Needle length: 9 cm Assessment Sensory level: T6 Events: CSF return Additional Notes Functioning IV was confirmed and monitors were applied. Sterile prep and drape, including hand hygiene and sterile gloves were used. The patient was positioned and the spine was prepped. The skin was anesthetized with lidocaine.  Free flow of clear CSF was obtained prior to injecting local anesthetic into the CSF.  The spinal needle aspirated freely following injection.  The needle was carefully withdrawn.  The patient tolerated the procedure well.

## 2021-04-22 NOTE — Anesthesia Preprocedure Evaluation (Signed)
Anesthesia Evaluation  Patient identified by MRN, date of birth, ID band Patient awake    Reviewed: Allergy & Precautions, NPO status , Patient's Chart, lab work & pertinent test results, reviewed documented beta blocker date and time   Airway Mallampati: III  TM Distance: >3 FB Neck ROM: Full    Dental no notable dental hx.    Pulmonary neg pulmonary ROS,    Pulmonary exam normal breath sounds clear to auscultation       Cardiovascular hypertension (gHTN), Pt. on home beta blockers and Pt. on medications Normal cardiovascular exam Rhythm:Regular Rate:Normal     Neuro/Psych  Headaches, PSYCHIATRIC DISORDERS Anxiety    GI/Hepatic Neg liver ROS, GERD  ,  Endo/Other  Morbid obesity (BMI 45)  Renal/GU negative Renal ROS  negative genitourinary   Musculoskeletal negative musculoskeletal ROS (+)   Abdominal   Peds  Hematology negative hematology ROS (+)   Anesthesia Other Findings Repeat C/Sx2  Reproductive/Obstetrics                             Anesthesia Physical Anesthesia Plan  ASA: 3  Anesthesia Plan: Spinal   Post-op Pain Management:    Induction:   PONV Risk Score and Plan: Treatment may vary due to age or medical condition  Airway Management Planned: Natural Airway  Additional Equipment:   Intra-op Plan:   Post-operative Plan:   Informed Consent: I have reviewed the patients History and Physical, chart, labs and discussed the procedure including the risks, benefits and alternatives for the proposed anesthesia with the patient or authorized representative who has indicated his/her understanding and acceptance.     Dental advisory given  Plan Discussed with: CRNA  Anesthesia Plan Comments:         Anesthesia Quick Evaluation

## 2021-04-22 NOTE — Lactation Note (Addendum)
This note was copied from a baby's chart. Lactation Consultation Note  Patient Name: Kiara Hall [id number] Date: 04/22/2021 Reason for consult: Initial assessment Age:39 hours Mom with hx GHTN see mom's MR. P3, ETI female infant. Mom is experienced at breastfeeding. Mom latched infant on her right breast using the football hold position, infant latched with depth, swallows observed., infant breastfeed for 16 minutes on mom's right breast and switched to mom's left breast when LC left the room. Per mom, she felt only tug with latch and no pain. Mom knows to breastfeed infant skin to skin and do breast stimulation techniques  to keep infant awake while breastfeeding such as: breast compressions, gently stroking infant's hands, shoulder and neck and talking to infant. Mom made aware of O/P services, breastfeeding support groups, community resources, and our phone # for post-discharge questions.   Mom's plan: 1- Mom will continue to breastfeed infant according to feeding cues, 8 to 12+ or more times within 24 hours, skin to skin. 2- Mom knows to ask RN/LC if she has breastfeeding questions, concerns or if she needs further latch assistance.  3- Mom knows to hand express and give infant back EBM if infant doesn't latch. Maternal Data Has patient been taught Hand Expression?: Yes Does the patient have breastfeeding experience prior to this delivery?: Yes How long did the patient breastfeed?: Per mom, she breastfeed her 1st child for one year and 2nd child who is 2 years for 6 months.  Feeding Mother's Current Feeding Choice: Breast Milk  LATCH Score Latch: Grasps breast easily, tongue down, lips flanged, rhythmical sucking.  Audible Swallowing: Spontaneous and intermittent  Type of Nipple: Everted at rest and after stimulation  Comfort (Breast/Nipple): Soft / non-tender  Hold (Positioning): Assistance needed to correctly position infant at breast and maintain latch.  LATCH Score:  9   Lactation Tools Discussed/Used    Interventions Interventions: Breast feeding basics reviewed;Assisted with latch;Skin to skin;Breast compression;Hand express;Adjust position;Support pillows;Position options;Education;LC Services brochure  Discharge    Consult Status Consult Status: Follow-up Date: 04/23/21 Follow-up type: In-patient    Danelle Earthly 04/22/2021, 5:37 PM

## 2021-04-22 NOTE — Transfer of Care (Signed)
Immediate Anesthesia Transfer of Care Note  Patient: Kiara Hall  Procedure(s) Performed: REPEAT CESAREAN SECTION WITH BILATERAL SALPINGECTOMY (Bilateral: Abdomen)  Patient Location: PACU  Anesthesia Type:Spinal  Level of Consciousness: awake  Airway & Oxygen Therapy: Patient Spontanous Breathing  Post-op Assessment: Report given to RN  Post vital signs: Reviewed and stable  Last Vitals:  Vitals Value Taken Time  BP    Temp    Pulse 111 04/22/21 1444  Resp 14 04/22/21 1444  SpO2 100 % 04/22/21 1444  Vitals shown include unvalidated device data.  Last Pain: There were no vitals filed for this visit.       Complications: No notable events documented.

## 2021-04-22 NOTE — H&P (Signed)
Date of Initial H&P: 04/20/21  History reviewed, patient examined, no change in status, stable for surgery.

## 2021-04-23 LAB — CBC
HCT: 26.1 % — ABNORMAL LOW (ref 36.0–46.0)
Hemoglobin: 8.7 g/dL — ABNORMAL LOW (ref 12.0–15.0)
MCH: 27.8 pg (ref 26.0–34.0)
MCHC: 33.3 g/dL (ref 30.0–36.0)
MCV: 83.4 fL (ref 80.0–100.0)
Platelets: 152 10*3/uL (ref 150–400)
RBC: 3.13 MIL/uL — ABNORMAL LOW (ref 3.87–5.11)
RDW: 16.2 % — ABNORMAL HIGH (ref 11.5–15.5)
WBC: 10.2 10*3/uL (ref 4.0–10.5)
nRBC: 0 % (ref 0.0–0.2)

## 2021-04-23 MED ORDER — OXYCODONE HCL 5 MG PO TABS
5.0000 mg | ORAL_TABLET | ORAL | 0 refills | Status: DC | PRN
Start: 2021-04-23 — End: 2021-06-29

## 2021-04-23 MED ORDER — IBUPROFEN 600 MG PO TABS: 600.0000 mg | ORAL_TABLET | Freq: Four times a day (QID) | ORAL | 1 refills | Status: DC | PRN

## 2021-04-23 NOTE — Progress Notes (Signed)
MOB was referred for history of anxiety. * Referral screened out by Clinical Social Worker because none of the following criteria appear to apply: ~ History of anxiety/depression during this pregnancy, or of post-partum depression following prior delivery. ~ Diagnosis of anxiety and/or depression within last 3 years OR * MOB's symptoms currently being treated with medication and/or therapy. Per prenatal records review, MOB is participating in therapy.  Please contact the Clinical Social Worker if needs arise, by [facility] request, or if MOB scores greater than 9/yes to question 10 on Edinburgh Postpartum Depression Screen.  Celso Sickle, LCSW Clinical Social Worker [facility] Cell#: [phone]

## 2021-04-23 NOTE — Plan of Care (Signed)
Problem: Education: Goal: Knowledge of General Education information will improve Description: Including pain rating scale, medication(s)/side effects and non-pharmacologic comfort measures Outcome: Completed/Met   Problem: Activity: Goal: Risk for activity intolerance will decrease Outcome: Completed/Met   

## 2021-04-23 NOTE — Lactation Note (Signed)
This note was copied from a baby's chart. Lactation Consultation Note  Patient Name: Kiara Hall [id number] Date: 04/23/2021 Reason for consult: Follow-up assessment;Early term 37-38.6wks Age:40 hours   Lactation Follow Up Consult:  Arrived to find baby latched and feeding on the left breast in the cradle hold.  Suggestions given to support breast and better body alignment for baby.  She had a wide gape and flanged lips; stimulation needed to continue sucking.  Mother reported that she was near the end of her feeding.    Reviewed breast feeding basics.  Mother asked how to use her manual pump.  Demonstrated given.  She has a DEBP at bedside and is using this for extra breast stimulation.  Mother had no further questions/concerns.  Visitors present.   Maternal Data    Feeding Mother's Current Feeding Choice: Breast Milk  LATCH Score Latch: Repeated attempts needed to sustain latch, nipple held in mouth throughout feeding, stimulation needed to elicit sucking reflex.  Audible Swallowing: None  Type of Nipple: Everted at rest and after stimulation  Comfort (Breast/Nipple): Soft / non-tender  Hold (Positioning): Assistance needed to correctly position infant at breast and maintain latch.  LATCH Score: 6   Lactation Tools Discussed/Used    Interventions Interventions: Breast feeding basics reviewed;Skin to skin;Breast compression;Education  Discharge Pump: Personal  Consult Status Consult Status: Follow-up Date: 04/24/21 Follow-up type: In-patient    [name] R [name] 04/23/2021, 2:27 PM

## 2021-04-23 NOTE — Progress Notes (Signed)
Patient told to keep  hydrated walk the halls if she is not dizzy, use her incentive spirometer. Plan of care and meds explained. Patient states she is not dizzy. No blood clots noted.

## 2021-04-23 NOTE — Progress Notes (Signed)
Subjective: Postpartum Day 1: Cesarean Delivery Patient reports incisional pain and tolerating PO.    Objective: Vital signs in last 24 hours: Temp:  [97.5 F (36.4 C)-99.1 F (37.3 C)] 97.5 F (36.4 C) (12/23 1311) Pulse Rate:  [95-153] 101 (12/23 1311) Resp:  [11-25] 18 (12/23 1311) BP: (109-136)/(57-80) 123/65 (12/23 1311) SpO2:  [97 %-100 %] 99 % (12/23 0800)  Physical Exam:  General: alert, cooperative, and no distress Lochia: appropriate Uterine Fundus: firm Incision: Bandage is clean dry and intact  DVT Evaluation: No evidence of DVT seen on physical exam.  Recent Labs    04/22/21 2155 04/23/21 0452  HGB 10.2* 8.7*  HCT 30.4* 26.1*    Assessment/Plan: Status post Cesarean section. Doing well postoperatively.  Continue current care Gestational hypertension - labetalol 100 mg bid held for sbp less than 125 or diastolic bp less than 75.  Anxiety - continue buspar  Encouraged ambulation.  Possible discharge home 04/24/2021.  Gerald Leitz 04/23/2021, 2:04 PM

## 2021-04-23 NOTE — Progress Notes (Signed)
Dr. Richardson Dopp aware of BO 116/59 and pulse 101. Orders to hold Labetalol for now.Hold if BP less than 125/70

## 2021-04-24 ENCOUNTER — Encounter (HOSPITAL_COMMUNITY): Payer: Self-pay | Admitting: Obstetrics and Gynecology

## 2021-04-24 NOTE — Progress Notes (Signed)
Call placed to Dr Connye Burkitt due to baby must stay. Orders received to cancel discharge and Labetalol

## 2021-04-24 NOTE — Lactation Note (Signed)
This note was copied from a baby's chart. Lactation Consultation Note  Patient Name: Kiara Hall [id number] Date: 04/24/2021 Reason for consult: Follow-up assessment;Mother's request;Breastfeeding assistance;Early term 37-38.6wks;Infant weight loss Age:39 hours  LC assisted with latching and reviewed feeding volume to increase supplemenation offering EBM first followed by formula. BF supplementation guide provided.   Maternal Data    Feeding Mother's Current Feeding Choice: Breast Milk and Formula  LATCH Score Latch: Repeated attempts needed to sustain latch, nipple held in mouth throughout feeding, stimulation needed to elicit sucking reflex.  Audible Swallowing: Spontaneous and intermittent  Type of Nipple: Everted at rest and after stimulation  Comfort (Breast/Nipple): Soft / non-tender  Hold (Positioning): Assistance needed to correctly position infant at breast and maintain latch.  LATCH Score: 8   Lactation Tools Discussed/Used Tools: Pump;Flanges Flange Size: 24 Breast pump type: Double-Electric Breast Pump Pump Education: Setup, frequency, and cleaning;Milk Storage Reason for Pumping: increase stimulation Pumping frequency: every 3 hrs for 15 min  Interventions Interventions: Breast feeding basics reviewed;Assisted with latch;Skin to skin;Breast massage;Hand express;Breast compression;Adjust position;Support pillows;Position options;Expressed milk;DEBP;Education;Pace feeding;Visual merchandiser education  Discharge    Consult Status Consult Status: Follow-up Date: 04/25/21 Follow-up type: In-patient    Kiara Dedrick  Hall 04/24/2021, 11:26 PM

## 2021-04-24 NOTE — Lactation Note (Signed)
This note was copied from a baby's chart. Lactation Consultation Note  Patient Name: Kiara Hall YVOPF'Y Date: 04/24/2021 Reason for consult: Follow-up assessment;Early term 37-38.6wks;Infant weight loss Age:39 hours  Visited with mom of 44 hours old ETI female, she's a P3 and experienced BF. Mom and baby might be going home today; baby is at 6% weight loss, she's a C/S baby.  Reviewed discharge education, engorgement prevention/treatment, sore nipples and red flags on when to call baby's pediatrician. Mom reports BF is going well she declined latch assistance at this time, she voiced that she feels comfortable taking baby to breast on her own, last LATCH score was 8.  Encouraged to continue putting baby to breast 8-12 times/24 hours or sooner if feeding cues are present. Mom will pump after feedings if she feels baby is not fully emptying the breast once she comes home. FOB present and very supportive. All questions and concerns answered, parents to contact LC PRN.  Maternal Data  Mom's supply is WNL  Feeding Mother's Current Feeding Choice: Breast Milk  Lactation Tools Discussed/Used Tools: Pump Breast pump type: Double-Electric Breast Pump;Manual Pump Education: Setup, frequency, and cleaning;Milk Storage Reason for Pumping: Mother's request Pumping frequency: only used it once, she's been taking baby to breast mainly Pumped volume:  (drops)  Interventions Interventions: Breast feeding basics reviewed;Education;Hand pump;DEBP  Discharge Discharge Education: Engorgement and breast care;Warning signs for feeding baby;Outpatient recommendation Pump: DEBP;Manual;Personal  Consult Status Consult Status: Complete Date: 04/24/21 Follow-up type: Call as needed   [name] Venetia Constable 04/24/2021, 9:32 AM

## 2021-04-24 NOTE — Discharge Summary (Addendum)
Postpartum Discharge Summary ° °Date of Service: 04/25/21  °   °Patient Name: Kiara Hall °DOB: 12/18/1980 °MRN: [id number] ° °Date of admission: 04/22/2021 °Delivery date:04/22/2021  °Delivering provider: COLE, TARA  °Date of discharge: 04/25/2021 ° °Admitting diagnosis: Gestational hypertension [O13.9] °Intrauterine pregnancy: [redacted]w[redacted]d     °Secondary diagnosis:  Principal Problem: °  Gestational hypertension °Anxiety °Obesity (BMI 45) °Additional problems: None    °Discharge diagnosis: Term Pregnancy Delivered and Acute blood loss anemia                                               °Post partum procedures: Bilateral salpingectomy °Augmentation: N/A °Complications: Hemorrhage>1000mL ° °Hospital course: Sceduled C/S   40 y.o. yo G4P3013 at [redacted]w[redacted]d was admitted to the hospital 04/22/2021 for scheduled cesarean section with the following indication:Elective Repeat.Delivery details are as follows:  °Membrane Rupture Time/Date: 1:27 PM ,04/22/2021   °Delivery Method:C-Section, Vacuum Assisted  °Details of operation can be found in separate operative note.  Patient had an uncomplicated postpartum course.  She is ambulating, tolerating a regular diet, passing flatus, and urinating well. Patient is discharged home in stable condition on  04/25/21 °       °Newborn Data: °Birth date:04/22/2021  °Birth time:1:28 PM  °Gender:Female  °Living status:Living  °Apgars:7 ,9  °Weight:3400 g    ° °Magnesium Sulfate received: No °BMZ received: No °Rhophylac:N/A °MMR:N/A °T-DaP:Given prenatally °Transfusion:No ° °Physical exam  °Vitals:  ° 04/24/21 0609 04/24/21 1730 04/24/21 2029 04/25/21 0512  °BP: (!) 120/59 130/65 131/71 132/79  °Pulse: (!) 111 (!) 102 (!) 119 (!) 116  °Resp: 18 18 18 18  °Temp: 98.2 °F (36.8 °C) 99 °F (37.2 °C) 98.8 °F (37.1 °C) 98 °F (36.7 °C)  °TempSrc: Oral Oral Oral Oral  °SpO2:  100% 100% 100%  °Weight:      °Height:      ° °General: alert, cooperative, and no distress °Lungs: No increased work of  breathing °Lochia: appropriate °Uterine Fundus: firm °Incision: Dressing is clean, dry, and intact °DVT Evaluation: No evidence of DVT seen on physical exam. °No cords or calf tenderness. °Labs: °Lab Results  °Component Value Date  ° WBC 10.2 04/23/2021  ° HGB 8.7 (L) 04/23/2021  ° HCT 26.1 (L) 04/23/2021  ° MCV 83.4 04/23/2021  ° PLT 152 04/23/2021  ° °CMP Latest Ref Rng & Units 04/20/2021  °Glucose 70 - 99 mg/dL 108(H)  °BUN 6 - 20 mg/dL 5(L)  °Creatinine 0.44 - 1.00 mg/dL 0.49  °Sodium 135 - 145 mmol/L 133(L)  °Potassium 3.5 - 5.1 mmol/L 3.8  °Chloride 98 - 111 mmol/L 104  °CO2 22 - 32 mmol/L 21(L)  °Calcium 8.9 - 10.3 mg/dL 8.8(L)  °Total Protein 6.5 - 8.1 g/dL 6.1(L)  °Total Bilirubin 0.3 - 1.2 mg/dL 1.1  °Alkaline Phos 38 - 126 U/L 83  °AST 15 - 41 U/L 21  °ALT 0 - 44 U/L 13  ° °Edinburgh Score: °Edinburgh Postnatal Depression Scale Screening Tool 04/24/2021  °I have been able to laugh and see the funny side of things. 0  °I have looked forward with enjoyment to things. 0  °I have blamed myself unnecessarily when things went wrong. 1  °I have been anxious or worried for no good reason. 1  °I have felt scared or panicky for no good reason. 0  °Things have been getting on top   of me. 0  °I have been so unhappy that I have had difficulty sleeping. 0  °I have felt sad or miserable. 0  °I have been so unhappy that I have been crying. 0  °The thought of harming myself has occurred to me. 0  °Edinburgh Postnatal Depression Scale Total 2  ° ° ° ° °After visit meds:  °Allergies as of 04/25/2021   ° °   Reactions  ° Metronidazole Hives, Shortness Of Breath  ° Flagyl/rash  ° °  ° °  °Medication List  °  ° °TAKE these medications   ° °acetaminophen 325 MG tablet °Commonly known as: TYLENOL °Take 2 tablets (650 mg total) by mouth every 4 (four) hours as needed for mild pain (temperature > 101.5.). °  °ferrous sulfate 325 (65 FE) MG tablet °Take 325 mg by mouth daily with breakfast. °  °fluticasone 50 MCG/ACT nasal  spray °Commonly known as: FLONASE °Place 1 spray into both nostrils daily as needed for allergies or rhinitis. °  °ibuprofen 600 MG tablet °Commonly known as: ADVIL °Take 1 tablet (600 mg total) by mouth every 6 (six) hours as needed. °  °labetalol 100 MG tablet °Commonly known as: NORMODYNE °Take 1 tablet (100 mg total) by mouth 2 (two) times daily. °  °loratadine 10 MG tablet °Commonly known as: CLARITIN °Take 10 mg by mouth daily as needed for allergies. °  °multivitamin-prenatal 27-0.8 MG Tabs tablet °Take 1 tablet by mouth daily at 12 noon. °  °oxyCODONE 5 MG immediate release tablet °Commonly known as: Oxy IR/ROXICODONE °Take 1-2 tablets (5-10 mg total) by mouth every 4 (four) hours as needed for moderate pain. °  ° °  ° ° ° °Discharge home in stable condition °Infant Feeding: Breast °Infant Disposition:home with mother °Discharge instruction: per After Visit Summary and Postpartum booklet. °Activity: Advance as tolerated. Pelvic rest for 6 weeks.  °Diet: routine diet °Anticipated Birth Control:  S/p bilateral salpingectomy at time of CS °Postpartum Appointment:6 weeks °Additional Postpartum F/U: Incision check 2 and BP check 1 week °Future Appointments:No future appointments. °Follow up Visit: ° Follow-up Information   ° ° Cole, Tara, MD. Go in 1 week(s).   °Specialty: Obstetrics and Gynecology °Why: please make an appt in 1 week with the nurse for bp check.. keep appt in 2 weeks with Dr. Cole for incision check °Contact information: °[address] °[address] °[place] [address] °[phone] ° ° °  °  ° °  °  ° °  ° ° ° °  ° °04/25/2021 ° , DO ° ° °

## 2021-04-24 NOTE — Progress Notes (Signed)
Postpartum Note Day #2  S:  Patient doing well.  Pain controlled.  Tolerating regular diet.   Ambulating and voiding without difficulty. Reports some soreness with movement. Still would like to go home today if possible.  Denies fevers, chills, chest pain, SOB, N/V, or worsening bilateral LE edema.  Lochia: Minimal Infant feeding:  Breast Circumcision:  N/A, female infant Contraception:  S/p bilateral salpingectomy at time of CS  O: Temp:  [97.5 F (36.4 C)-98.4 F (36.9 C)] 98.2 F (36.8 C) (12/24 0609) Pulse Rate:  [101-116] 111 (12/24 0609) Resp:  [18-20] 18 (12/24 0609) BP: (116-125)/(59-65) 120/59 (12/24 4709) Gen: NAD, pleasant and cooperative CV: RRR Resp: CTAB, no wheezes/rales/rhonchi Abdomen: soft, non-distended, non-tender throughout Uterus: firm, non-tender, below umbilicus Incision: c/d/i, bandage in place  Ext: No bilateral LE edema, no bilateral calf tenderness, SCDs on and working  Labs:  Recent Labs    04/22/21 2155 04/23/21 0452  HGB 10.2* 8.7*    A/P: Patient is a 40 y.o. G2E3662 POD#2 s/p LTCS+BS.  S/p LTCS+BS - Pain well controlled  - GU: UOP is adequate - GI: Tolerating regular diet - Activity: encouraged sitting up to chair and ambulation as tolerated - DVT Prophylaxis: SCDs, frequent ambulation - Labs: as above - oral iron ordered  Gestational HTN - Medications: Labetalol 100mg  BID (HELD) - Given normotensive to lower blood pressures, patient understands not to take while at home but to monitor Bps and come to office for BP check  Anxiety - Medication: Buspar 7.5mg  BID  Disposition:  D/C home today. If infant remains in patient, patient will remain in patient overnight.   , DO [phone] (office)

## 2021-04-25 NOTE — Lactation Note (Signed)
This note was copied from a baby's chart. Lactation Consultation Note  Patient Name: Kiara Hall CHYIF'O Date: 04/25/2021 Reason for consult: Follow-up assessment;Early term 37-38.6wks;RN request Age:39 hours  P3, Baby 37 weeks and has been sleepy at the breast although mother states baby has improved with breastfeeding and seems to be staying on longer.  Mother has been working hard at pumping and is now puming 15 ml and supplementing with formula also.  Plan is to continue to offer breast and supplement with slow flow nipple.  Pacifier use not recommended at this time.  Wake baby for feedings if needed and recommend pumping to give extra volume to baby. Reviewed engorgement care and monitoring voids/stools.   Feeding Mother's Current Feeding Choice: Breast Milk and Formula   Lactation Tools Discussed/Used  DEBP  Interventions Interventions: Breast feeding basics reviewed;Education  Discharge Discharge Education: Engorgement and breast care;Warning signs for feeding baby Pump: Manual;DEBP (Medela and hands free)  Consult Status Consult Status: Complete Date: 04/25/21    Dahlia Byes [facility] 04/25/2021, 8:42 AM

## 2021-04-25 NOTE — Progress Notes (Addendum)
Postpartum Note Day #3  S:  Patient doing well.  Pain controlled.  Tolerating regular diet.   Ambulating and voiding without difficulty. Excited to go home today. Due to her blood pressures increasing, she is curious about resuming Labetalol at home. Denies fevers, chills, chest pain, SOB, N/V, or worsening bilateral LE edema.  Lochia: Minimal Infant feeding:  Breast Circumcision:  N/A, female infant Contraception:  S/p bilateral salpingectomy at time of CS  O: Temp:  [98 F (36.7 C)-99 F (37.2 C)] 98 F (36.7 C) (12/25 0512) Pulse Rate:  [102-119] 116 (12/25 0512) Resp:  [18] 18 (12/25 0512) BP: (130-132)/(65-79) 132/79 (12/25 0512) SpO2:  [100 %] 100 % (12/25 0512) Gen: NAD, pleasant and cooperative Resp: No increased work of breathing Abdomen: soft, non-distended, non-tender throughout Uterus: firm, non-tender, below umbilicus Incision: c/d/i, bandage in place  Ext: No bilateral LE edema, no bilateral calf tenderness  Labs:  Recent Labs    04/22/21 2155 04/23/21 0452  HGB 10.2* 8.7*    A/P: Patient is a 40 y.o. T6L4650 POD#3 s/p LTCS+BS.  S/p LTCS+BS - Pain well controlled  - GU: UOP is adequate - GI: Tolerating regular diet - Activity: encouraged sitting up to chair and ambulation as tolerated - DVT Prophylaxis: SCDs, frequent ambulation - Labs: as above - oral iron ordered  Gestational HTN - Medications: Labetalol 100mg  BID  - Given Bps 130s/60-70s, okay to resume Labetalol with BP checks at home and keeping her BP check in no later than 1 week  Anxiety - Medication: Buspar 7.5mg  BID  Disposition:  D/C home today  , DO [phone] (office)

## 2021-04-27 LAB — SURGICAL PATHOLOGY

## 2021-05-04 ENCOUNTER — Telehealth (HOSPITAL_COMMUNITY): Payer: Self-pay | Admitting: *Deleted

## 2021-05-04 NOTE — Telephone Encounter (Signed)
Attempted Hospital Discharge Follow-Up Call.  Left voice mail requesting that patient return RN's phone call.  

## 2021-05-06 DIAGNOSIS — Z4889 Encounter for other specified surgical aftercare: Secondary | ICD-10-CM | POA: Diagnosis not present

## 2021-06-03 ENCOUNTER — Encounter: Payer: Self-pay | Admitting: *Deleted

## 2021-06-06 DIAGNOSIS — H6692 Otitis media, unspecified, left ear: Secondary | ICD-10-CM | POA: Diagnosis not present

## 2021-06-16 ENCOUNTER — Telehealth: Payer: Self-pay

## 2021-06-16 ENCOUNTER — Ambulatory Visit: Payer: Federal, State, Local not specified - PPO | Admitting: Cardiology

## 2021-06-16 NOTE — Telephone Encounter (Signed)
Notes scanned to referral 

## 2021-06-23 ENCOUNTER — Encounter: Payer: Self-pay | Admitting: Cardiology

## 2021-06-23 NOTE — Progress Notes (Signed)
Kiara Sarah, MD Reason for referral-palpitations  HPI: 41 year old female for evaluation of palpitations at request of [name], MD.  Echocardiogram 2009 showed normal LV function.  Patient seen previously but not since 2009.  Patient had C-section December 2022.  Laboratories December 2022 showed potassium 3.8 and hemoglobin 8.7.  Patient states she has had intermittent palpitations for years.  They were exacerbated during previous pregnancies.  She has had worsening symptoms since January.  Her symptoms are described as a flip-flop and a skip.  They are not sustained.  No associated presyncope or syncope.  She otherwise denies dyspnea on exertion, orthopnea, PND, pedal edema, exertional chest pain.  Cardiology now asked to evaluate.  Current Outpatient Medications  Medication Sig Dispense Refill   acetaminophen (TYLENOL) 325 MG tablet Take 2 tablets (650 mg total) by mouth every 4 (four) hours as needed for mild pain (temperature > 101.5.).     ferrous sulfate 325 (65 FE) MG tablet Take 325 mg by mouth daily with breakfast.     fluticasone (FLONASE) 50 MCG/ACT nasal spray Place 1 spray into both nostrils daily as needed for allergies or rhinitis.     ibuprofen (ADVIL) 600 MG tablet Take 1 tablet (600 mg total) by mouth every 6 (six) hours as needed. 30 tablet 1   loratadine (CLARITIN) 10 MG tablet Take 10 mg by mouth daily as needed for allergies.     Magnesium-Potassium 250-100 MG TABS Take 1 tablet by mouth daily.     Prenatal Vit-Fe Fumarate-FA (MULTIVITAMIN-PRENATAL) 27-0.8 MG TABS tablet Take 1 tablet by mouth daily at 12 noon.     No current facility-administered medications for this visit.    Allergies  Allergen Reactions   Metronidazole Hives and Shortness Of Breath    Flagyl/rash     Past Medical History:  Diagnosis Date   Anemia    Anxiety    Chicken pox    Complication of anesthesia    hypotension with one of the spinal   GERD (gastroesophageal reflux  disease)    Palpitation     Past Surgical History:  Procedure Laterality Date   BREAST BIOPSY Left    benign    CESAREAN SECTION N/A 02/09/2015   Procedure: CESAREAN SECTION;  Surgeon: [name], MD;  Location: [facility];  Service: Obstetrics;  Laterality: N/A;   CESAREAN SECTION N/A 02/05/2019   Procedure: CESAREAN SECTION;  Surgeon: [name], MD;  Location: [facility];  Service: Obstetrics;  Laterality: N/A;   CESAREAN SECTION Bilateral 04/22/2021   Procedure: REPEAT CESAREAN SECTION WITH BILATERAL SALPINGECTOMY;  Surgeon: [name], MD;  Location: [facility];  Service: Obstetrics;  Laterality: Bilateral;   TONSILLECTOMY      Social History   Socioeconomic History   Marital status: Married    Spouse name: Not on file   Number of children: 3   Years of education: Not on file   Highest education level: Not on file  Occupational History   Not on file  Tobacco Use   Smoking status: Never   Smokeless tobacco: Never  Vaping Use   Vaping Use: Never used  Substance and Sexual Activity   Alcohol use: Yes    Comment: Occasional   Drug use: No   Sexual activity: Yes    Birth control/protection: Condom  Other Topics Concern   Not on file  Social History Narrative   Not on file   Social Determinants of Health   Financial Resource Strain: Not on  file  Food Insecurity: Not on file  Transportation Needs: Not on file  Physical Activity: Not on file  Stress: Not on file  Social Connections: Not on file  Intimate Partner Violence: Not on file    Family History  Problem Relation Age of Onset   Diabetes Mother    Hypertension Mother    Heart disease Maternal Uncle 26   Hypertension Maternal Grandfather    Miscarriages / Stillbirths Sister    Hypertension Maternal Aunt    Stroke Maternal Grandmother     ROS: no fevers or chills, productive cough, hemoptysis, dysphasia, odynophagia, melena, hematochezia, dysuria, hematuria, rash, seizure activity, orthopnea, PND, pedal  edema, claudication. Remaining systems are negative.  Physical Exam:   Blood pressure 130/60, pulse 91, height 5\' 1"  (1.549 m), weight 202 lb 12.8 oz (92 kg), last menstrual period 08/06/2020, unknown if currently breastfeeding.  General:  Well developed/well nourished in NAD Skin warm/dry Patient not depressed No peripheral clubbing Back-normal HEENT-normal/normal eyelids Neck supple/normal carotid upstroke bilaterally; no bruits; no JVD; no thyromegaly chest - CTA/ normal expansion CV - RRR/normal S1 and S2; no murmurs, rubs or gallops;  PMI nondisplaced Abdomen -NT/ND, no HSM, no mass, + bowel sounds, no bruit 2+ femoral pulses, no bruits Ext-no edema, chords, 2+ DP Neuro-grossly nonfocal  ECG -normal sinus rhythm at a rate of 91, normal axis, no ST changes.  Personally reviewed  A/P  1 palpitations-symptoms sound likely to be PACs or PVCs.  We will arrange echocardiogram to assess LV function.  Schedule 3 day Zio patch to further assess.  Kirk Ruths, MD

## 2021-06-29 ENCOUNTER — Encounter: Payer: Self-pay | Admitting: Cardiology

## 2021-06-29 ENCOUNTER — Ambulatory Visit (INDEPENDENT_AMBULATORY_CARE_PROVIDER_SITE_OTHER): Payer: Federal, State, Local not specified - PPO

## 2021-06-29 ENCOUNTER — Ambulatory Visit: Payer: Federal, State, Local not specified - PPO | Admitting: Cardiology

## 2021-06-29 ENCOUNTER — Other Ambulatory Visit: Payer: Self-pay

## 2021-06-29 ENCOUNTER — Encounter: Payer: Self-pay | Admitting: *Deleted

## 2021-06-29 VITALS — BP 130/60 | HR 91 | Ht 61.0 in | Wt 202.8 lb

## 2021-06-29 DIAGNOSIS — R002 Palpitations: Secondary | ICD-10-CM | POA: Diagnosis not present

## 2021-06-29 NOTE — Progress Notes (Unsigned)
Enrolled for [employer] to mail a ZIO XT long term holter monitor to the patients address on file.  

## 2021-06-29 NOTE — Patient Instructions (Signed)
Testing/Procedures:  Your physician has requested that you have an echocardiogram. Echocardiography is a painless test that uses sound waves to create images of your heart. It provides your doctor with information about the size and shape of your heart and how well your hearts chambers and valves are working. This procedure takes approximately one hour. There are no restrictions for this procedure. [place] Instructions  Your physician has requested you wear a ZIO patch monitor for 3 days.  This is a single patch monitor. [employer] supplies one patch monitor per enrollment. Additional stickers are not available. Please do not apply patch if you will be having a Nuclear Stress Test,  Echocardiogram, Cardiac CT, MRI, or Chest Xray during the period you would be wearing the  monitor. The patch cannot be worn during these tests. You cannot remove and re-apply the  ZIO XT patch monitor.  Your ZIO patch monitor will be mailed 3 day USPS to your address on file. It may take 3-5 days  to receive your monitor after you have been enrolled.  Once you have received your monitor, please review the enclosed instructions. Your monitor  has already been registered assigning a specific monitor serial # to you.  Billing and Patient Assistance Program Information  We have supplied [employer] with any of your insurance information on file for billing purposes. [employer] offers a sliding scale Patient Assistance Program for patients that do not have  insurance, or whose insurance does not completely cover the cost of the ZIO monitor.  You must apply for the Patient Assistance Program to qualify for this discounted rate.  To apply, please call [employer] at [phone], select option 4, select option 2, ask to apply for  Patient Assistance Program. Theodore Demark will ask your household income, and how many people  are in your household. They will quote your out-of-pocket cost based  on that information.  [employer] will also be able to set up a 61-month, interest-free payment plan if needed.  Applying the monitor   Shave hair from upper left chest.  Hold abrader disc by orange tab. Rub abrader in 40 strokes over the upper left chest as  indicated in your monitor instructions.  Clean area with 4 enclosed alcohol pads. Let dry.  Apply patch as indicated in monitor instructions. Patch will be placed under collarbone on left  side of chest with arrow pointing upward.  Rub patch adhesive wings for 2 minutes. Remove white label marked "1". Remove the white  label marked "2". Rub patch adhesive wings for 2 additional minutes.  While looking in a mirror, press and release button in center of patch. A small green light will  flash 3-4 times. This will be your only indicator that the monitor has been turned on.  Do not shower for the first 24 hours. You may shower after the first 24 hours.  Press the button if you feel a symptom. You will hear a small click. Record Date, Time and  Symptom in the Patient Logbook.  When you are ready to remove the patch, follow instructions on the last 2 pages of Patient  Logbook. Stick patch monitor onto the last page of Patient Logbook.  Place Patient Logbook in the blue and white box. Use locking tab on box and tape box closed  securely. The blue and white box has prepaid postage on it. Please place it in the mailbox as  soon as possible. Your physician should have your  test results approximately 7 days after the  monitor has been mailed back to [facility].  Call [place] at [phone] if you have questions regarding  your ZIO XT patch monitor. Call them immediately if you see an orange light blinking on your  monitor.  If your monitor falls off in less than 4 days, contact our Monitor department at [phone].  If your monitor becomes loose or falls off after 4 days call [employer] at [phone] for  suggestions  on securing your monitor    Follow-Up: At [facility], you and your health needs are our priority.  As part of our continuing mission to provide you with exceptional heart care, we have created designated Provider Care Teams.  These Care Teams include your primary Cardiologist (physician) and Advanced Practice Providers (APPs -  Physician Assistants and Nurse Practitioners) who all work together to provide you with the care you need, when you need it.  We recommend signing up for the patient portal called "MyChart".  Sign up information is provided on this After Visit Summary.  MyChart is used to connect with patients for Virtual Visits (Telemedicine).  Patients are able to view lab/test results, encounter notes, upcoming appointments, etc.  Non-urgent messages can be sent to your provider as well.   To learn more about what you can do with MyChart, go to [email or web address].    Your next appointment:   6 month(s)  The format for your next appointment:   In Person  Provider:   Kirk Ruths MD

## 2021-07-01 DIAGNOSIS — Z4889 Encounter for other specified surgical aftercare: Secondary | ICD-10-CM | POA: Diagnosis not present

## 2021-07-02 DIAGNOSIS — R002 Palpitations: Secondary | ICD-10-CM | POA: Diagnosis not present

## 2021-07-07 ENCOUNTER — Encounter: Payer: Self-pay | Admitting: Cardiology

## 2021-07-07 ENCOUNTER — Ambulatory Visit (INDEPENDENT_AMBULATORY_CARE_PROVIDER_SITE_OTHER): Payer: Federal, State, Local not specified - PPO

## 2021-07-07 ENCOUNTER — Encounter (HOSPITAL_BASED_OUTPATIENT_CLINIC_OR_DEPARTMENT_OTHER): Payer: Self-pay

## 2021-07-07 ENCOUNTER — Other Ambulatory Visit: Payer: Self-pay

## 2021-07-07 DIAGNOSIS — R002 Palpitations: Secondary | ICD-10-CM

## 2021-07-07 LAB — ECHOCARDIOGRAM COMPLETE
AR max vel: 2.59 cm2
AV Area VTI: 2.54 cm2
AV Area mean vel: 2.72 cm2
AV Mean grad: 4 mmHg
AV Peak grad: 8 mmHg
Ao pk vel: 1.41 m/s
Area-P 1/2: 3.93 cm2
Calc EF: 56.9 %
S' Lateral: 4.21 cm
Single Plane A2C EF: 58.5 %
Single Plane A4C EF: 56.5 %

## 2021-07-08 ENCOUNTER — Encounter: Payer: Self-pay | Admitting: Cardiology

## 2021-07-08 DIAGNOSIS — F411 Generalized anxiety disorder: Secondary | ICD-10-CM | POA: Diagnosis not present

## 2021-07-08 DIAGNOSIS — F410 Panic disorder [episodic paroxysmal anxiety] without agoraphobia: Secondary | ICD-10-CM | POA: Diagnosis not present

## 2021-07-13 ENCOUNTER — Telehealth: Payer: Self-pay | Admitting: *Deleted

## 2021-07-13 NOTE — Telephone Encounter (Signed)
Spoke with pt, she reports that the palpitations have calmed down some. She feels it is related to anxiety and she is working with her OB and is getting ready to start zoloft. She is going to try to get her anxiety under control first. She will call back if symptoms do not improve. ?

## 2021-07-13 NOTE — Telephone Encounter (Signed)
-----   Message from Lewayne Bunting, MD sent at 07/13/2021  7:16 AM EDT ----- ?Symptoms are associated with PVCs.  If patient significantly symptomatic can add Toprol 25 mg daily and follow-up. ?Kiara Hall ? ?

## 2021-07-26 DIAGNOSIS — F411 Generalized anxiety disorder: Secondary | ICD-10-CM | POA: Diagnosis not present

## 2021-07-28 ENCOUNTER — Ambulatory Visit: Payer: Federal, State, Local not specified - PPO | Admitting: Nurse Practitioner

## 2021-08-02 ENCOUNTER — Encounter: Payer: Self-pay | Admitting: Family Medicine

## 2021-08-02 ENCOUNTER — Ambulatory Visit: Payer: Federal, State, Local not specified - PPO | Admitting: Family Medicine

## 2021-08-02 VITALS — BP 130/90 | HR 97 | Temp 97.7°F | Ht 61.0 in | Wt 210.4 lb

## 2021-08-02 DIAGNOSIS — J029 Acute pharyngitis, unspecified: Secondary | ICD-10-CM | POA: Diagnosis not present

## 2021-08-02 DIAGNOSIS — H66001 Acute suppurative otitis media without spontaneous rupture of ear drum, right ear: Secondary | ICD-10-CM

## 2021-08-02 DIAGNOSIS — J020 Streptococcal pharyngitis: Secondary | ICD-10-CM | POA: Diagnosis not present

## 2021-08-02 DIAGNOSIS — R051 Acute cough: Secondary | ICD-10-CM | POA: Diagnosis not present

## 2021-08-02 MED ORDER — AMOXICILLIN 875 MG PO TABS: 875.0000 mg | ORAL_TABLET | Freq: Two times a day (BID) | ORAL | 0 refills | Status: AC

## 2021-08-02 NOTE — Progress Notes (Signed)
? ?Acute Office Visit ? ?Subjective:  ? ? Patient ID: Kiara Hall, female    DOB: [birth year], 41 y.o.   MRN: [id number] ? ?Chief Complaint  ?Patient presents with  ? Sore Throat  ?  Pt. C/o soar throat coughing symptoms since Friday.   ? ? ?HPI ?Patient complaining of sore throat, cough, right ear pain.  Exposure to family also similar symptoms.  Describes throat as "stabbing," pain.  Denies fevers, chest pain, shortness of breath.  At home COVID test was negative.  Endorses a lot of nasal drainage.  Has tried OTC Tylenol, Flonase with minimal improvement.  Patient is breast-feeding her 67-month-old. ? ?Past Medical History:  ?Diagnosis Date  ? Anemia   ? Anxiety   ? Chicken pox   ? Complication of anesthesia   ? hypotension with one of the spinal  ? GERD (gastroesophageal reflux disease)   ? Palpitation   ? ? ?Past Surgical History:  ?Procedure Laterality Date  ? BREAST BIOPSY Left   ? benign   ? CESAREAN SECTION N/A 02/09/2015  ? Procedure: CESAREAN SECTION;  Surgeon: [name], MD;  Location: [facility];  Service: Obstetrics;  Laterality: N/A;  ? CESAREAN SECTION N/A 02/05/2019  ? Procedure: CESAREAN SECTION;  Surgeon: [name], MD;  Location: [facility];  Service: Obstetrics;  Laterality: N/A;  ? CESAREAN SECTION Bilateral 04/22/2021  ? Procedure: REPEAT CESAREAN SECTION WITH BILATERAL SALPINGECTOMY;  Surgeon: [name], MD;  Location: [facility];  Service: Obstetrics;  Laterality: Bilateral;  ? TONSILLECTOMY    ? ? ?Family History  ?Problem Relation Age of Onset  ? Diabetes Mother   ? Hypertension Mother   ? Heart disease Maternal Uncle 36  ? Hypertension Maternal Grandfather   ? Miscarriages / Stillbirths Sister   ? Hypertension Maternal Aunt   ? Stroke Maternal Grandmother   ? ? ?Social History  ? ?Socioeconomic History  ? Marital status: Married  ?  Spouse name: Not on file  ? Number of children: 3  ? Years of education: Not on file  ? Highest education level: Not on file  ?Occupational History  ? Not on  file  ?Tobacco Use  ? Smoking status: Never  ? Smokeless tobacco: Never  ?Vaping Use  ? Vaping Use: Never used  ?Substance and Sexual Activity  ? Alcohol use: Yes  ?  Comment: Occasional  ? Drug use: No  ? Sexual activity: Yes  ?  Birth control/protection: Condom  ?Other Topics Concern  ? Not on file  ?Social History Narrative  ? Not on file  ? ?Social Determinants of Health  ? ?Financial Resource Strain: Not on file  ?Food Insecurity: Not on file  ?Transportation Needs: Not on file  ?Physical Activity: Not on file  ?Stress: Not on file  ?Social Connections: Not on file  ?Intimate Partner Violence: Not on file  ? ? ?Outpatient Medications Prior to Visit  ?Medication Sig Dispense Refill  ? acetaminophen (TYLENOL) 325 MG tablet Take 2 tablets (650 mg total) by mouth every 4 (four) hours as needed for mild pain (temperature > 101.5.).    ? fluticasone (FLONASE) 50 MCG/ACT nasal spray Place 1 spray into both nostrils daily as needed for allergies or rhinitis.    ? ibuprofen (ADVIL) 600 MG tablet Take 1 tablet (600 mg total) by mouth every 6 (six) hours as needed. 30 tablet 1  ? loratadine (CLARITIN) 10 MG tablet Take 10 mg by mouth daily as needed for allergies.    ?  Magnesium-Potassium 250-100 MG TABS Take 1 tablet by mouth daily.    ? Prenatal Vit-Fe Fumarate-FA (MULTIVITAMIN-PRENATAL) 27-0.8 MG TABS tablet Take 1 tablet by mouth daily at 12 noon.    ? ferrous sulfate 325 (65 FE) MG tablet Take 325 mg by mouth daily with breakfast.    ? ?No facility-administered medications prior to visit.  ? ? ?Allergies  ?Allergen Reactions  ? Metronidazole Hives and Shortness Of Breath  ?  Flagyl/rash  ? ? ?Review of Systems ?As per HPI ?   ?Objective:  ?  ?Physical Exam ?Gen: NAD, resting comfortably ?HEENT: Red posterior pharynx, moist mucous membranes, bilateral ears with distended tympanic membrane, effusion behind right tympanic membrane, right ear tender on examination ?CV: RRR with no murmurs appreciated ?Pulm: NWOB, CTAB  with no crackles, wheezes, or rhonchi ?GI: Normal bowel sounds present. Soft, Nontender, Nondistended. ?MSK: no edema, cyanosis, or clubbing noted ?Skin: warm, dry ?Neuro: grossly normal, moves all extremities ?Psych: Normal affect and thought content ? ?BP 130/90 (BP Location: Left Arm, Patient Position: Sitting, Cuff Size: Normal)   Pulse 97   Temp 97.7 ?F (36.5 ?C) (Temporal)   Ht 5\' 1"  (1.549 m)   Wt 210 lb 6.4 oz (95.4 kg)   SpO2 97%   BMI 39.75 kg/m?  ?Wt Readings from Last 3 Encounters:  ?08/02/21 210 lb 6.4 oz (95.4 kg)  ?06/29/21 202 lb 12.8 oz (92 kg)  ?04/22/21 240 lb (108.9 kg)  ? ? ?Health Maintenance Due  ?Topic Date Due  ? Hepatitis C Screening  Never done  ? COVID-19 Vaccine (3 - Booster for Moderna series) 10/21/2019  ? PAP SMEAR-Modifier  06/27/2020  ? ? ?There are no preventive care reminders to display for this patient. ? ? ?Lab Results  ?Component Value Date  ? TSH 1.51 06/10/2020  ? ?Lab Results  ?Component Value Date  ? WBC 10.2 04/23/2021  ? HGB 8.7 (L) 04/23/2021  ? HCT 26.1 (L) 04/23/2021  ? MCV 83.4 04/23/2021  ? PLT 152 04/23/2021  ? ?Lab Results  ?Component Value Date  ? NA 133 (L) 04/20/2021  ? K 3.8 04/20/2021  ? CO2 21 (L) 04/20/2021  ? GLUCOSE 108 (H) 04/20/2021  ? BUN 5 (L) 04/20/2021  ? CREATININE 0.49 04/20/2021  ? BILITOT 1.1 04/20/2021  ? ALKPHOS 83 04/20/2021  ? AST 21 04/20/2021  ? ALT 13 04/20/2021  ? PROT 6.1 (L) 04/20/2021  ? ALBUMIN 2.7 (L) 04/20/2021  ? CALCIUM 8.8 (L) 04/20/2021  ? ANIONGAP 8 04/20/2021  ? GFR 110.03 06/10/2020  ? ?Lab Results  ?Component Value Date  ? CHOL 168 06/10/2020  ? ?Lab Results  ?Component Value Date  ? HDL 76.00 06/10/2020  ? ?Lab Results  ?Component Value Date  ? [place] 84 06/10/2020  ? ?Lab Results  ?Component Value Date  ? TRIG 39.0 06/10/2020  ? ?Lab Results  ?Component Value Date  ? CHOLHDL 2 06/10/2020  ? ?Lab Results  ?Component Value Date  ? HGBA1C 5.7 06/10/2020  ? ? ?   ?Assessment & Plan:  ?Cough,, nasal  congestion ?Possibility of viral syndrome. ?Influenza test,negative ?Recommend continue Flonase, and other OTC supportive care, did advise to avoid pseudoepinephrine and frustration antihistamines as these might try up breastmilk ? ?Right otitis media with effusion ?Recommend amoxicillin ? ?Sore throat,strep ?Point-of-care strep, positive ?Amoxicillin as above ? ? ?Problem List Items Addressed This Visit   ?None ?Visit Diagnoses   ? ? Sore throat    -  Primary  ?  Relevant Medications  ? amoxicillin (AMOXIL) 875 MG tablet  ? Other Relevant Orders  ? POCT rapid strep A  ? Non-recurrent acute suppurative otitis media of right ear without spontaneous rupture of tympanic membrane      ? Relevant Medications  ? amoxicillin (AMOXIL) 875 MG tablet  ? ?  ? ? ? ?Meds ordered this encounter  ?Medications  ? amoxicillin (AMOXIL) 875 MG tablet  ?  Sig: Take 1 tablet (875 mg total) by mouth 2 (two) times daily for 10 days.  ?  Dispense:  20 tablet  ?  Refill:  0  ? ? ? ?Bonnita Hollow, MD ? ?

## 2021-08-02 NOTE — Patient Instructions (Addendum)
Take amoxicillin as prescribed for ear infection as strep throat..  You may take other OTC medications such as ibuprofen, Tylenol, dextromethorphan as needed for pain and cough. ?

## 2021-08-03 LAB — POCT INFLUENZA A/B
Influenza A, POC: NEGATIVE
Influenza B, POC: NEGATIVE

## 2021-08-03 LAB — POCT RAPID STREP A (OFFICE): Rapid Strep A Screen: POSITIVE — AB

## 2021-08-05 DIAGNOSIS — F411 Generalized anxiety disorder: Secondary | ICD-10-CM | POA: Diagnosis not present

## 2021-08-20 ENCOUNTER — Ambulatory Visit: Payer: Federal, State, Local not specified - PPO | Admitting: Nurse Practitioner

## 2021-08-20 VITALS — BP 118/72 | HR 91 | Temp 97.6°F | Ht 61.0 in | Wt 211.8 lb

## 2021-08-20 DIAGNOSIS — E559 Vitamin D deficiency, unspecified: Secondary | ICD-10-CM

## 2021-08-20 DIAGNOSIS — D500 Iron deficiency anemia secondary to blood loss (chronic): Secondary | ICD-10-CM | POA: Diagnosis not present

## 2021-08-20 DIAGNOSIS — R002 Palpitations: Secondary | ICD-10-CM | POA: Diagnosis not present

## 2021-08-20 LAB — BASIC METABOLIC PANEL
BUN: 14 mg/dL (ref 6–23)
CO2: 28 mEq/L (ref 19–32)
Calcium: 9.5 mg/dL (ref 8.4–10.5)
Chloride: 105 mEq/L (ref 96–112)
Creatinine, Ser: 0.64 mg/dL (ref 0.40–1.20)
GFR: 110.72 mL/min (ref >60.00)
Glucose, Bld: 91 mg/dL (ref 70–99)
Potassium: 4.2 mEq/L (ref 3.5–5.1)
Sodium: 140 mEq/L (ref 135–145)

## 2021-08-20 LAB — CBC
HCT: 38.2 % (ref 36.0–46.0)
Hemoglobin: 12.4 g/dL (ref 12.0–15.0)
MCHC: 32.4 g/dL (ref 30.0–36.0)
MCV: 74.3 fl — ABNORMAL LOW (ref 78.0–100.0)
Platelets: 232 10*3/uL (ref 150.0–400.0)
RBC: 5.14 Mil/uL — ABNORMAL HIGH (ref 3.87–5.11)
RDW: 20.3 % — ABNORMAL HIGH (ref 11.5–15.5)
WBC: 4.1 10*3/uL (ref 4.0–10.5)

## 2021-08-20 LAB — VITAMIN D 25 HYDROXY (VIT D DEFICIENCY, FRACTURES): VITD: 21.88 ng/mL — ABNORMAL LOW (ref 30.00–100.00)

## 2021-08-20 LAB — TSH: TSH: 1.11 u[IU]/mL (ref 0.35–5.50)

## 2021-08-20 LAB — MAGNESIUM: Magnesium: 1.9 mg/dL (ref 1.5–2.5)

## 2021-08-20 MED ORDER — AZELASTINE HCL 0.1 % NA SOLN: 1.0000 | Freq: Two times a day (BID) | NASAL | 5 refills | Status: DC

## 2021-08-20 NOTE — Progress Notes (Signed)
? ?             Established Patient Visit ? ?Patient: Kiara Hall   DOB: Sep 23, 1980   41 y.o. Female  MRN: [id number] ?Visit Date: 08/23/2021 ? ?Subjective:  ?  ?Chief Complaint  ?Patient presents with  ? Follow-up  ?  F/U still having scratchy throat in the evenings.  Also want blood work for thyroid.   ? ?HPI ?Palpitation ?Onset November, onset 41months after childbirth. Had similar experience with birth of second child. This resolved spontaneously after 15month. ?Describes Intermittent palpitations, no specific trigger, no CP or dizziness or syncope ?Had eval by cardiology 06/2021 (echocardiogram and holter monitor-ne acute finding) ?Occurs 5episode per day, last a few seconds. ?Was improving, then got worse again with after strep infection 07/2021-treated with PCN-she completed treatment as prescribed ?Some improvement with potassium 99mg  and magnesium500mg , calcium 500mg . ?She stopped caffeine consumption 1week ago. ?Admits to interrupted sleep due to baby's schedule and care of other 2 young children. ? ? ? ?Reviewed medical, surgical, and social history today ? ?Medications: ?Outpatient Medications Prior to Visit  ?Medication Sig  ? acetaminophen (TYLENOL) 325 MG tablet Take 2 tablets (650 mg total) by mouth every 4 (four) hours as needed for mild pain (temperature > 101.5.).  ? fluticasone (FLONASE) 50 MCG/ACT nasal spray Place 1 spray into both nostrils daily as needed for allergies or rhinitis.  ? ibuprofen (ADVIL) 600 MG tablet Take 1 tablet (600 mg total) by mouth every 6 (six) hours as needed.  ? loratadine (CLARITIN) 10 MG tablet Take 10 mg by mouth daily as needed for allergies.  ? Magnesium-Potassium 250-100 MG TABS Take 1 tablet by mouth daily.  ? Prenatal Vit-Fe Fumarate-FA (MULTIVITAMIN-PRENATAL) 27-0.8 MG TABS tablet Take 1 tablet by mouth daily at 12 noon.  ? [DISCONTINUED] ferrous sulfate 325 (65 FE) MG tablet Take 325 mg by mouth daily with breakfast.  ? ?No facility-administered medications  prior to visit.  ? ?Reviewed past medical and social history.  ? ?ROS per HPI above ? ? ?   ?Objective:  ?BP 118/72   Pulse 91   Temp 97.6 ?F (36.4 ?C) (Temporal)   Ht 5\' 1"  (1.549 m)   Wt 211 lb 12.8 oz (96.1 kg)   SpO2 98%   BMI 40.02 kg/m?  ? ?  ? ?Physical Exam ?Constitutional:   ?   Appearance: She is obese.  ?Cardiovascular:  ?   Rate and Rhythm: Normal rate and regular rhythm.  ?   Pulses: Normal pulses.  ?   Heart sounds: Normal heart sounds.  ?Pulmonary:  ?   Effort: Pulmonary effort is normal.  ?   Breath sounds: Normal breath sounds.  ?Musculoskeletal:  ?   Right lower leg: No edema.  ?   Left lower leg: No edema.  ?Neurological:  ?   Mental Status: She is alert and oriented to person, place, and time.  ?  ?Results for orders placed or performed in visit on 08/20/21  ?Basic metabolic panel  ?Result Value Ref Range  ? Sodium 140 135 - 145 mEq/L  ? Potassium 4.2 3.5 - 5.1 mEq/L  ? Chloride 105 96 - 112 mEq/L  ? CO2 28 19 - 32 mEq/L  ? Glucose, Bld 91 70 - 99 mg/dL  ? BUN 14 6 - 23 mg/dL  ? Creatinine, Ser 0.64 0.40 - 1.20 mg/dL  ? GFR 110.72 >60.00 mL/min  ? Calcium 9.5 8.4 - 10.5 mg/dL  ?TSH  ?Result Value Ref  Range  ? TSH 1.11 0.35 - 5.50 uIU/mL  ?Magnesium  ?Result Value Ref Range  ? Magnesium 1.9 1.5 - 2.5 mg/dL  ?CBC  ?Result Value Ref Range  ? WBC 4.1 4.0 - 10.5 K/uL  ? RBC 5.14 (H) 3.87 - 5.11 Mil/uL  ? Platelets 232.0 150.0 - 400.0 K/uL  ? Hemoglobin 12.4 12.0 - 15.0 g/dL  ? HCT 38.2 36.0 - 46.0 %  ? MCV 74.3 (L) 78.0 - 100.0 fl  ? MCHC 32.4 30.0 - 36.0 g/dL  ? RDW 20.3 (H) 11.5 - 15.5 %  ?Iron, TIBC and Ferritin Panel  ?Result Value Ref Range  ? Iron 58 40 - 190 mcg/dL  ? TIBC 355 250 - 450 mcg/dL (calc)  ? %SAT 16 16 - 45 % (calc)  ? Ferritin 35 16 - 154 ng/mL  ?Vitamin D (25 hydroxy)  ?Result Value Ref Range  ? VITD 21.88 (L) 30.00 - 100.00 ng/mL  ? ?   ?Assessment & Plan:  ?  ?Problem List Items Addressed This Visit   ? ?  ? Other  ? ANEMIA-IRON DEFICIENCY  ? Relevant Orders  ? CBC  (Completed)  ? Iron, TIBC and Ferritin Panel (Completed)  ? Palpitation - Primary  ?  Onset November, onset 21months after childbirth. Had similar experience with birth of second child. This resolved spontaneously after 26month. ?Describes Intermittent palpitations, no specific trigger, no CP or dizziness or syncope ?Had eval by cardiology 06/2021 (echocardiogram and holter monitor-ne acute finding) ?Occurs 5episode per day, last a few seconds. ?Was improving, then got worse again with after strep infection 07/2021-treated with PCN-she completed treatment as prescribed ?Some improvement with potassium 99mg  and magnesium500mg , calcium 500mg . ?She stopped caffeine consumption 1week ago. ?Admits to interrupted sleep due to baby's schedule and care of other 2 young children. ?  ?  ? Relevant Orders  ? Basic metabolic panel (Completed)  ? TSH (Completed)  ? Magnesium (Completed)  ? ?Other Visit Diagnoses   ? ? Vitamin D deficiency      ? Relevant Medications  ? Vitamin D, Ergocalciferol, (DRISDOL) 1.25 MG (50000 UNIT) CAPS capsule  ? Other Relevant Orders  ? Vitamin D (25 hydroxy) (Completed)  ? ?  ? ?Return if symptoms worsen or fail to improve. ? ?  ? ? , NP ? ? ?

## 2021-08-20 NOTE — Patient Instructions (Addendum)
Go to lab for blood draw. ?Use flonase and azelastine for eustachian tube dysfunction. ? ?Managing Stress, Adult ?Feeling a certain amount of stress is normal. Stress helps our body and mind get ready to deal with the demands of life. Stress hormones can motivate you to do well at work and meet your responsibilities. But severe or long-term (chronic) stress can affect your mental and physical health. Chronic stress puts you at higher risk for: ?Anxiety and depression. ?Other health problems such as digestive problems, muscle aches, heart disease, high blood pressure, and stroke. ?What are the causes? ?Common causes of stress include: ?Demands from work, such as deadlines, feeling overworked, or having long hours. ?Pressures at home, such as money issues, disagreements with a spouse, or parenting issues. ?Pressures from major life changes, such as divorce, moving, loss of a loved one, or chronic illness. ?You may be at higher risk for stress-related problems if you: ?Do not get enough sleep. ?Are in poor health. ?Do not have emotional support. ?Have a mental health disorder such as anxiety or depression. ?How to recognize stress ?Stress can make you: ?Have trouble sleeping. ?Feel sad, anxious, irritable, or overwhelmed. ?Lose your appetite. ?Overeat or want to eat unhealthy foods. ?Want to use drugs or alcohol. ?Stress can also cause physical symptoms, such as: ?Sore, tense muscles, especially in the shoulders and neck. ?Headaches. ?Trouble breathing. ?A faster heart rate. ?Stomach pain, nausea, or vomiting. ?Diarrhea or constipation. ?Trouble concentrating. ?Follow these instructions at home: ?Eating and drinking ?Eat a healthy diet. This includes: ?Eating foods that are high in fiber, such as beans, whole grains, and fresh fruits and vegetables. ?Limiting foods that are high in fat and processed sugars, such as fried or sweet foods. ?Do not skip meals or overeat. ?Drink enough fluid to keep your urine pale  yellow. ?Alcohol use ?Do not drink alcohol if: ?Your health care provider tells you not to drink. ?You are pregnant, may be pregnant, or are planning to become pregnant. ?Drinking alcohol is a way some people try to ease their stress. This can be dangerous, so if you drink alcohol: ?Limit how much you have to: ?0-1 drink a day for women. ?0-2 drinks a day for men. ?Know how much alcohol is in your drink. In the [place] one drink equals one 12 oz bottle of beer (355 mL), one 5 oz glass of wine (148 mL), or one 1? oz glass of hard liquor (44 mL). ?Activity ? ?Include 30 minutes of exercise in your daily schedule. Exercise is a good stress reducer. ?Include time in your day for an activity that you find relaxing. Try taking a walk, going on a bike ride, reading a book, or listening to music. ?Schedule your time in a way that lowers stress, and keep a regular schedule. Focus on doing what is most important to get done. ?Lifestyle ?Identify the source of your stress and your reaction to it. See a therapist who can help you change unhelpful reactions. ?When there are stressful events: ?Talk about them with family, friends, or coworkers. ?Try to think realistically about stressful events and not ignore them or overreact. ?Try to find the positives in a stressful situation and not focus on the negatives. ?Cut back on responsibilities at work and home, if possible. Ask for help from friends or family members if you need it. ?Find ways to manage stress, such as: ?Mindfulness, meditation, or deep breathing. ?Yoga or tai chi. ?Progressive muscle relaxation. ?Spending time in nature. ?Doing art, playing music,  or reading. ?Making time for fun activities. ?Spending time with family and friends. ?Get support from family, friends, or spiritual resources. ?General instructions ?Get enough sleep. Try to go to sleep and get up at about the same time every day. ?Take over-the-counter and prescription medicines only as told by your health  care provider. ?Do not use any products that contain nicotine or tobacco. These products include cigarettes, chewing tobacco, and vaping devices, such as e-cigarettes. If you need help quitting, ask your health care provider. ?Do not use drugs or smoke to deal with stress. ?Keep all follow-up visits. This is important. ?Where to find support ?Talk with your health care provider about stress management or finding a support group. ?Find a therapist to work with you on your stress management techniques. ?Where to find more information ?National Alliance on Mental Illness: www.nami.org ?American Psychological Association: TVStereos.ch ?Contact a health care provider if: ?Your stress symptoms get worse. ?You are unable to manage your stress at home. ?You are struggling to stop using drugs or alcohol. ?Get help right away if: ?You may be a danger to yourself or others. ?You have any thoughts of death or suicide. ?Get help right awayif you feel like you may hurt yourself or others, or have thoughts about taking your own life. Go to your nearest emergency room or: ?Call 911. ?Call the [place] at 308-764-3086 or 988 in the [place] This is open 24 hours a day. ?Text the Crisis Text Line at (301) 687-4450. ?Summary ?Feeling a certain amount of stress is normal, but severe or long-term (chronic) stress can affect your mental and physical health. ?Chronic stress can put you at higher risk for anxiety, depression, and other health problems such as digestive problems, muscle aches, heart disease, high blood pressure, and stroke. ?You may be at higher risk for stress-related problems if you do not get enough sleep, are in poor health, lack emotional support, or have a mental health disorder such as anxiety or depression. ?Identify the source of your stress and your reaction to it. Try talking about stressful events with family, friends, or coworkers, finding a coping method, or getting support from spiritual  resources. ?If you need more help, talk with your health care provider about finding a support group or a mental health therapist. ?This information is not intended to replace advice given to you by your health care provider. Make sure you discuss any questions you have with your health care provider. ?Document Revised: 11/12/2020 Document Reviewed: 11/10/2020 ?Elsevier Patient Education ? [place]. ? ?

## 2021-08-21 LAB — IRON,TIBC AND FERRITIN PANEL
%SAT: 16 % (calc) (ref 16–45)
Ferritin: 35 ng/mL (ref 16–154)
Iron: 58 ug/dL (ref 40–190)
TIBC: 355 mcg/dL (calc) (ref 250–450)

## 2021-08-22 MED ORDER — VITAMIN D (ERGOCALCIFEROL) 1.25 MG (50000 UNIT) PO CAPS: 50000.0000 [IU] | ORAL_CAPSULE | ORAL | 0 refills | Status: DC

## 2021-08-23 ENCOUNTER — Encounter: Payer: Self-pay | Admitting: Nurse Practitioner

## 2021-08-23 ENCOUNTER — Other Ambulatory Visit: Payer: Self-pay | Admitting: Nurse Practitioner

## 2021-08-23 DIAGNOSIS — E559 Vitamin D deficiency, unspecified: Secondary | ICD-10-CM

## 2021-08-23 NOTE — Assessment & Plan Note (Addendum)
Onset November, onset 33months after childbirth. Had similar experience with birth of second child. This resolved spontaneously after 19month. ?Describes Intermittent palpitations, no specific trigger, no CP or dizziness or syncope ?Had eval by cardiology 06/2021 (echocardiogram and holter monitor-ne acute finding) ?Occurs 5episode per day, last a few seconds. ?Was improving, then got worse again with after strep infection 07/2021-treated with PCN-she completed treatment as prescribed ?Some improvement with potassium 99mg  and magnesium500mg , calcium 500mg . ?She stopped caffeine consumption 1week ago. ?Admits to interrupted sleep due to baby's schedule and care of other 2 young children. ? ?Check cbc, iron panel, bmp, magnesium ?

## 2021-08-24 NOTE — Telephone Encounter (Signed)
duplicate

## 2021-08-25 ENCOUNTER — Encounter: Payer: Self-pay | Admitting: Nurse Practitioner

## 2021-08-26 DIAGNOSIS — F411 Generalized anxiety disorder: Secondary | ICD-10-CM | POA: Diagnosis not present

## 2021-10-27 ENCOUNTER — Other Ambulatory Visit: Payer: Self-pay

## 2021-10-27 ENCOUNTER — Emergency Department (HOSPITAL_BASED_OUTPATIENT_CLINIC_OR_DEPARTMENT_OTHER): Payer: Federal, State, Local not specified - PPO

## 2021-10-27 ENCOUNTER — Emergency Department (HOSPITAL_BASED_OUTPATIENT_CLINIC_OR_DEPARTMENT_OTHER)
Admission: EM | Admit: 2021-10-27 | Discharge: 2021-10-27 | Disposition: A | Discharge status: Home / Self Care | Payer: Federal, State, Local not specified - PPO | Attending: Emergency Medicine | Admitting: Emergency Medicine

## 2021-10-27 ENCOUNTER — Encounter (HOSPITAL_BASED_OUTPATIENT_CLINIC_OR_DEPARTMENT_OTHER): Payer: Self-pay | Admitting: Emergency Medicine

## 2021-10-27 DIAGNOSIS — R110 Nausea: Secondary | ICD-10-CM | POA: Diagnosis not present

## 2021-10-27 DIAGNOSIS — F419 Anxiety disorder, unspecified: Secondary | ICD-10-CM | POA: Diagnosis not present

## 2021-10-27 DIAGNOSIS — R519 Headache, unspecified: Secondary | ICD-10-CM | POA: Insufficient documentation

## 2021-10-27 DIAGNOSIS — R202 Paresthesia of skin: Secondary | ICD-10-CM | POA: Diagnosis not present

## 2021-10-27 DIAGNOSIS — H53149 Visual discomfort, unspecified: Secondary | ICD-10-CM | POA: Diagnosis not present

## 2021-10-27 LAB — CBC WITH DIFFERENTIAL/PLATELET
Abs Immature Granulocytes: 0.02 10*3/uL (ref 0.00–0.07)
Basophils Absolute: 0 10*3/uL (ref 0.0–0.1)
Basophils Relative: 0 %
Eosinophils Absolute: 0.1 10*3/uL (ref 0.0–0.5)
Eosinophils Relative: 1 %
HCT: 40.2 % (ref 36.0–46.0)
Hemoglobin: 12.7 g/dL (ref 12.0–15.0)
Immature Granulocytes: 0 %
Lymphocytes Relative: 23 %
Lymphs Abs: 1.5 10*3/uL (ref 0.7–4.0)
MCH: 24.7 pg — ABNORMAL LOW (ref 26.0–34.0)
MCHC: 31.6 g/dL (ref 30.0–36.0)
MCV: 78.2 fL — ABNORMAL LOW (ref 80.0–100.0)
Monocytes Absolute: 0.6 10*3/uL (ref 0.1–1.0)
Monocytes Relative: 10 %
Neutro Abs: 4.2 10*3/uL (ref 1.7–7.7)
Neutrophils Relative %: 66 %
Platelets: 234 10*3/uL (ref 150–400)
RBC: 5.14 MIL/uL — ABNORMAL HIGH (ref 3.87–5.11)
RDW: 17.3 % — ABNORMAL HIGH (ref 11.5–15.5)
WBC: 6.4 10*3/uL (ref 4.0–10.5)
nRBC: 0 % (ref 0.0–0.2)

## 2021-10-27 LAB — BASIC METABOLIC PANEL
Anion gap: 12 (ref 5–15)
BUN: 10 mg/dL (ref 6–20)
CO2: 24 mmol/L (ref 22–32)
Calcium: 9.7 mg/dL (ref 8.9–10.3)
Chloride: 102 mmol/L (ref 98–111)
Creatinine, Ser: 0.6 mg/dL (ref 0.44–1.00)
GFR, Estimated: >60 mL/min (ref >60)
Glucose, Bld: 96 mg/dL (ref 70–99)
Potassium: 3.6 mmol/L (ref 3.5–5.1)
Sodium: 138 mmol/L (ref 135–145)

## 2021-10-27 MED ORDER — DIPHENHYDRAMINE HCL 50 MG/ML IJ SOLN
50.0000 mg | Freq: Once | INTRAMUSCULAR | Status: AC
  Administered 2021-10-27: 50 mg via INTRAVENOUS
  Filled 2021-10-27: qty 1

## 2021-10-27 MED ORDER — METOCLOPRAMIDE HCL 5 MG/ML IJ SOLN
10.0000 mg | Freq: Once | INTRAMUSCULAR | Status: AC
  Administered 2021-10-27: 10 mg via INTRAVENOUS
  Filled 2021-10-27: qty 2

## 2021-10-27 NOTE — Discharge Instructions (Signed)
You were seen today due to a headache with other symptoms.  Your head CT was reassuring for no signs of acute intracranial abnormalities.  As discussed, please follow-up with your outpatient providers to see which medications are safe at this time for use while lactating.  You administered both Reglan and Benadryl here in the hospital which alleviated much of your headache pain.  If you develop any life-threatening symptoms such as sharp, worsening headache, worst headache of your life, chest pain, shortness of breath, please return to the emergency department for further evaluation

## 2021-10-27 NOTE — ED Triage Notes (Signed)
Pt arrives to ED with c/o headache. The headache is left sided. Pt reports she has had intermittent headaches over the past two weeks. She does report associated neck pain. Associated symptoms include dizziness, nausea, runny nose, photo-sensitivity. Pt denies fevers.

## 2021-10-27 NOTE — ED Provider Notes (Signed)
[facility] EMERGENCY DEPT Provider Note   CSN: [id number] Arrival date & time: 10/27/21  1427     History  Chief Complaint  Patient presents with   Headache    Kiara Hall is a 41 y.o. female.  Patient presents to the hospital with multiple complaints, headache being chief among them.  Patient endorses a headache which has been intermittent over the past 1 and half weeks.  Describes as starting at the base of her skull R side of her neck and worsening.  Patient states sometimes it is bilateral sometimes it is in the left side of her head.  Patient also complains of pain with pressure directly above her eyes bilaterally.  Patient complains of intermittent tingling.  Patient states that she believes she has a pinched nerve in the left arm.  Patient also complains of some mild nausea and mild photophobia.  Denies fevers, shortness of breath, chest pain, urinary symptoms.  Past medical history significant for anxiety, palpitations, anemia, GERD  HPI     Home Medications Prior to Admission medications   Medication Sig Start Date End Date Taking? Authorizing Provider  acetaminophen (TYLENOL) 325 MG tablet Take 2 tablets (650 mg total) by mouth every 4 (four) hours as needed for mild pain (temperature > 101.5.). 02/06/19   Gerald Leitz, MD  azelastine (ASTELIN) 0.1 % nasal spray Place 1 spray into both nostrils 2 (two) times daily. Use in each nostril as directed 08/20/21   Nche, Bonna Gains, NP  fluticasone (FLONASE) 50 MCG/ACT nasal spray Place 1 spray into both nostrils daily as needed for allergies or rhinitis.    [provider]  ibuprofen (ADVIL) 600 MG tablet Take 1 tablet (600 mg total) by mouth every 6 (six) hours as needed. 04/23/21   Gerald Leitz, MD  loratadine (CLARITIN) 10 MG tablet Take 10 mg by mouth daily as needed for allergies.    [provider]  Magnesium-Potassium 250-100 MG TABS Take 1 tablet by mouth daily.    [provider]   Prenatal Vit-Fe Fumarate-FA (MULTIVITAMIN-PRENATAL) 27-0.8 MG TABS tablet Take 1 tablet by mouth daily at 12 noon.    [provider]  Vitamin D, Ergocalciferol, (DRISDOL) 1.25 MG (50000 UNIT) CAPS capsule Take 1 capsule (50,000 Units total) by mouth every 7 (seven) days. 08/22/21   Nche, Bonna Gains, NP      Allergies    Metronidazole    Review of Systems   Review of Systems  Constitutional:  Negative for fever.  HENT:  Positive for sinus pain. Negative for sinus pressure and sore throat.   Eyes:  Positive for photophobia. Negative for visual disturbance.  Respiratory:  Negative for shortness of breath.   Cardiovascular:  Negative for chest pain.  Gastrointestinal:  Positive for nausea. Negative for abdominal pain and vomiting.  Genitourinary:  Negative for dysuria.  Neurological:  Positive for dizziness, numbness and headaches. Negative for facial asymmetry, weakness and light-headedness.    Physical Exam Updated Vital Signs BP 140/85 (BP Location: Right Arm)   Pulse 92   Temp 98.2 F (36.8 C) (Oral)   Resp 16   Ht 5\' 1"  (1.549 m)   Wt 97.5 kg   SpO2 97%   Breastfeeding Yes   BMI 40.62 kg/m  Physical Exam Vitals and nursing note reviewed.  Constitutional:      General: She is not in acute distress. HENT:     Head: Normocephalic and atraumatic.  Eyes:     General: No visual  field deficit.    Extraocular Movements: Extraocular movements intact.     Pupils: Pupils are equal, round, and reactive to light.  Cardiovascular:     Rate and Rhythm: Normal rate and regular rhythm.     Heart sounds: Normal heart sounds.  Pulmonary:     Effort: Pulmonary effort is normal.     Breath sounds: Normal breath sounds.  Abdominal:     Palpations: Abdomen is soft.  Musculoskeletal:     Cervical back: Normal range of motion and neck supple. No rigidity.  Lymphadenopathy:     Cervical: No cervical adenopathy.  Skin:    General: Skin is warm and dry.  Neurological:      Mental Status: She is alert.     Cranial Nerves: No dysarthria or facial asymmetry.     Sensory: Sensation is intact.     Motor: Motor function is intact.     Coordination: Coordination normal. Heel to Shin Test normal.     Comments: Cranial nerves II through VII, XI, XII intact  Psychiatric:        Mood and Affect: Mood is anxious.     ED Results / Procedures / Treatments   Labs (all labs ordered are listed, but only abnormal results are displayed) Labs Reviewed  CBC WITH DIFFERENTIAL/PLATELET - Abnormal; Notable for the following components:      Result Value   RBC 5.14 (*)    MCV 78.2 (*)    MCH 24.7 (*)    RDW 17.3 (*)    All other components within normal limits  BASIC METABOLIC PANEL    EKG None  Radiology CT Head Wo Contrast  Result Date: 10/27/2021 CLINICAL DATA:  Headache, sudden and severe. EXAM: CT HEAD WITHOUT CONTRAST TECHNIQUE: Contiguous axial images were obtained from the base of the skull through the vertex without intravenous contrast. RADIATION DOSE REDUCTION: This exam was performed according to the departmental dose-optimization program which includes automated exposure control, adjustment of the mA and/or kV according to patient size and/or use of iterative reconstruction technique. COMPARISON:  CT head 12/31/2007 FINDINGS: Brain: No evidence of acute infarction, hemorrhage, hydrocephalus, extra-axial collection or mass lesion/mass effect. Vascular: Negative for hyperdense vessel Skull: Negative Sinuses/Orbits: Paranasal sinuses clear.  Negative orbit Other: None IMPRESSION: Negative CT head Electronically Signed   By: Franchot Gallo M.D.   On: 10/27/2021 16:06    Procedures Procedures    Medications Ordered in ED Medications  metoCLOPramide (REGLAN) injection 10 mg (10 mg Intravenous Given 10/27/21 1613)  diphenhydrAMINE (BENADRYL) injection 50 mg (50 mg Intravenous Given 10/27/21 1609)    ED Course/ Medical Decision Making/ A&P                            Medical Decision Making Amount and/or Complexity of Data Reviewed Labs: ordered. Radiology: ordered.  Risk Prescription drug management.   Patient presents with a chief complaint of headache.  Differential includes but is not limited to migraine, tension headache, intracranial abnormality, and others  I reviewed patient's medical history including office notes showing visits for palpitations  I ordered and interpreted imaging including head CT without contrast.  Head CT shows no intracranial abnormalities.  I agree with radiologist findings.  I ordered Reglan and Benadryl for the patient for headache.  Upon reassessment the patient's headache had improved.  I ordered basic labs.  Pertinent results include hemoglobin 12.7 showing no signs of current anemia, grossly normal BMP  Patient is feeling better after medication administration and feels reassured by the normal head CT.  No sign of stroke or occipital stroke at this time.  I feel the patient can discharge home.  This is likely tension headache versus migraine based on presentation and history.  Patient has appointment coming up in July with primary care which she plans to keep.  Return precautions provided          Final Clinical Impression(s) / ED Diagnoses Final diagnoses:  Nonintractable headache, unspecified chronicity pattern, unspecified headache type  Nausea    Rx / DC Orders ED Discharge Orders     None         Pamala Duffel 10/27/21 2138    Milagros Loll, MD 10/29/21 [id number]

## 2021-10-27 NOTE — ED Notes (Signed)
Patient transported to CT 

## 2021-11-01 ENCOUNTER — Encounter: Payer: Federal, State, Local not specified - PPO | Admitting: Nurse Practitioner

## 2021-11-11 ENCOUNTER — Encounter: Payer: Self-pay | Admitting: Nurse Practitioner

## 2021-11-11 ENCOUNTER — Ambulatory Visit: Payer: Federal, State, Local not specified - PPO | Admitting: Nurse Practitioner

## 2021-11-11 VITALS — BP 120/82 | HR 93 | Temp 97.6°F | Ht 61.0 in | Wt 217.6 lb

## 2021-11-11 DIAGNOSIS — F53 Postpartum depression: Secondary | ICD-10-CM

## 2021-11-11 DIAGNOSIS — H6983 Other specified disorders of Eustachian tube, bilateral: Secondary | ICD-10-CM | POA: Diagnosis not present

## 2021-11-11 DIAGNOSIS — G44209 Tension-type headache, unspecified, not intractable: Secondary | ICD-10-CM

## 2021-11-11 DIAGNOSIS — Z6838 Body mass index (BMI) 38.0-38.9, adult: Secondary | ICD-10-CM

## 2021-11-11 MED ORDER — TIZANIDINE HCL 2 MG PO CAPS: 2.0000 mg | ORAL_CAPSULE | Freq: Three times a day (TID) | ORAL | 0 refills | Status: DC | PRN

## 2021-11-11 MED ORDER — SUMATRIPTAN SUCCINATE 25 MG PO TABS: ORAL_TABLET | ORAL | 0 refills | Status: DC

## 2021-11-11 NOTE — Assessment & Plan Note (Signed)
She declined use of zoloft Has ongoing counseling sessions

## 2021-11-11 NOTE — Assessment & Plan Note (Addendum)
Acute on chronic, occipital and neck region. No improvement with tylenol and ibuprofen Deep tissue massage caused dizziness. CT head 10/2021: normal  We discussed possible adverse effects of imitrex and muscle relaxant on her infant while breastfeeding. She verbalized understanding and choice to take med but will pump breast milk and dump for 12-24hrs with each dose of imitrex or zanaflex. Advised to start neck and should stretch exercise and change sleep position. Alternate between warm and cold compress. F/up prn

## 2021-11-11 NOTE — Patient Instructions (Addendum)
Do not breastfeed for 12-24hrs after use zanaflex and imitrex. Continue neck and shoulder stretches. Alternate between warm and cold compress. Call office if no improvement in 1week.  Shoulder Range of Motion Exercises Shoulder range of motion (ROM) exercises are done to keep the shoulder moving freely or to increase movement. They are often recommended for people who have shoulder pain or stiffness or who are recovering from a shoulder surgery. Phase 1 exercise When you are able, do this exercise 1-2 times per day for 30-60 seconds in each direction, or as directed by your health care provider. Pendulum exercise     To do this exercise while sitting: Sit in a chair or at the edge of your bed with your feet flat on the floor. Let your affected arm hang down in front of you over the edge of the bed or chair. Relax your shoulder, arm, and hand. Rock your body so your arm gently swings in small circles. You can also use your unaffected arm to start the motion. Repeat changing the direction of the circles, swinging your arm left and right, and swinging your arm forward and back. To do this exercise while standing: Stand next to a sturdy chair or table, and hold on to it with your hand on your unaffected side. Bend forward at the waist. Bend your knees slightly. Relax your shoulder, arm, and hand. While keeping your shoulder relaxed, use body motion to swing your arm in small circles. Repeat changing the direction of the circles, swinging your arm left and right, and swinging your arm forward and back. Between exercises, stand up tall and take a short break to relax your lower back.  Phase 2 exercises Do these exercises 1-2 times per day or as told by your health care provider. Hold each stretch for 30 seconds, and repeat 3 times. Do the exercises with one or both arms as instructed by your health care provider. For these exercises, sit at a table with your hand and arm supported by the  table. A chair that slides easily or has wheels can be helpful. External rotation  Turn your chair so that your affected side is nearest to the table. Place your forearm on the table to your side. Bend your elbow in about a 90-degree angle (right angle) at the elbow, and place your hand palm-down on the table. Your elbow should be about 6 inches (15 cm) away from your side. Keeping your arm on the table, lean your body forward. Abduction  Turn your chair so that your affected side is nearest to the table. Place your forearm and hand on the table so that your thumb points toward the ceiling and your arm is straight out to your side. Slide your hand out to the side and away from you, using your unaffected arm to do the work. To increase the stretch, you can slide your chair away from the table. Flexion: forward stretch  Sit facing the table. Place your hand and elbow on the table in front of you. Slide your hand forward and away from you, using your unaffected arm to do the work. To increase the stretch, you can slide your chair backward. Phase 3 exercises Do these exercises 1-2 times per day or as told by your health care provider. Hold each stretch for 30 seconds, and repeat 3 times. Do the exercises with one or both arms as instructed by your health care provider. You will need a cane, a piece of PVC pipe, or  a sturdy wooden dowel for the wand exercises. Cross-body stretch: posterior capsule stretch  Lift your arm straight out in front of you. Bend your arm in a 90-degree angle (right angle) at the elbow so your forearm moves across your body. Use your other arm to gently pull the elbow across your body, toward your other shoulder. Wall climbs  Stand with your affected arm extended out to the side with your hand resting on a door frame. Slide your hand slowly up the door frame. To increase the stretch, step through the door frame. Keep your body upright and do not lean. Flexion      To do this exercise while standing: Hold the wand with both of your hands, palms down. Using the other arm to help, lift your arms up and over your head, if able. Push upward with your other arm to gently increase the stretch. To do this exercise while lying down: Lie on your back with your elbows resting on the floor and the wand in both your hands. Your hands will be palm down, or pointing toward your feet. Lift your hands toward the ceiling, using your unaffected arm to help if needed. Bring your arms overhead as able, using your unaffected arm to help if needed.  Internal rotation  Stand while holding the wand behind you with both hands. Your unaffected arm should be extended above your head with the arm of the affected side extended behind you at the level of your waist. The wand should be pointing straight up and down as you hold it. Slowly pull the wand up behind your back by straightening the elbow of your unaffected arm and bending the elbow of your affected arm. External rotation  Lie on your back with your affected upper arm supported on a small pillow or rolled towel. When you first do this exercise, keep your upper arm close to your body. Over time, bring your arm up to a 90-degree angle (right angle) out to the side. Hold the wand across your stomach and with both hands palm up. Your elbow on your affected side should be bent at a 90-degree angle. Use your unaffected side to help push your forearm away from you and toward the floor. Keep your elbow on your affected side bent at a 90-degree angle. Contact a health care provider if you have: New or increasing pain. New numbness, tingling, weakness, or discoloration in your arm or hand. This information is not intended to replace advice given to you by your health care provider. Make sure you discuss any questions you have with your health care provider. Document Revised: 04/13/2021 Document Reviewed: 01/01/2021 Elsevier Patient  Education  2023 [employer]   Neck Exercises Ask your health care provider which exercises are safe for you. Do exercises exactly as told by your health care provider and adjust them as directed. It is normal to feel mild stretching, pulling, tightness, or discomfort as you do these exercises. Stop right away if you feel sudden pain or your pain gets worse. Do not begin these exercises until told by your health care provider. Neck exercises can be important for many reasons. They can improve strength and maintain flexibility in your neck, which will help your upper back and prevent neck pain. Stretching exercises Rotation neck stretching  Sit in a chair or stand up. Place your feet flat on the floor, shoulder-width apart. Slowly turn your head (rotate) to the right until a slight stretch is felt. Turn it all  the way to the right so you can look over your right shoulder. Do not tilt or tip your head. Hold this position for 10-30 seconds. Slowly turn your head (rotate) to the left until a slight stretch is felt. Turn it all the way to the left so you can look over your left shoulder. Do not tilt or tip your head. Hold this position for 10-30 seconds. Repeat __________ times. Complete this exercise __________ times a day. Neck retraction  Sit in a sturdy chair or stand up. Look straight ahead. Do not bend your neck. Use your fingers to push your chin backward (retraction). Do not bend your neck for this movement. Continue to face straight ahead. If you are doing the exercise properly, you will feel a slight sensation in your throat and a stretch at the back of your neck. Hold the stretch for 1-2 seconds. Repeat __________ times. Complete this exercise __________ times a day. Strengthening exercises Neck press  Lie on your back on a firm bed or on the floor with a pillow under your head. Use your neck muscles to push your head down on the pillow and straighten your spine. Hold the position  as well as you can. Keep your head facing up (in a neutral position) and your chin tucked. Slowly count to 5 while holding this position. Repeat __________ times. Complete this exercise __________ times a day. Isometrics These are exercises in which you strengthen the muscles in your neck while keeping your neck still (isometrics). Sit in a supportive chair and place your hand on your forehead. Keep your head and face facing straight ahead. Do not flex or extend your neck while doing isometrics. Push forward with your head and neck while pushing back with your hand. Hold for 10 seconds. Do the sequence again, this time putting your hand against the back of your head. Use your head and neck to push backward against the hand pressure. Finally, do the same exercise on either side of your head, pushing sideways against the pressure of your hand. Repeat __________ times. Complete this exercise __________ times a day. Prone head lifts  Lie face-down (prone position), resting on your elbows so that your chest and upper back are raised. Start with your head facing downward, near your chest. Position your chin either on or near your chest. Slowly lift your head upward. Lift until you are looking straight ahead. Then continue lifting your head as far back as you can comfortably stretch. Hold your head up for 5 seconds. Then slowly lower it to your starting position. Repeat __________ times. Complete this exercise __________ times a day. Supine head lifts  Lie on your back (supine position), bending your knees to point to the ceiling and keeping your feet flat on the floor. Lift your head slowly off the floor, raising your chin toward your chest. Hold for 5 seconds. Repeat __________ times. Complete this exercise __________ times a day. Scapular retraction  Stand with your arms at your sides. Look straight ahead. Slowly pull both shoulders (scapulae) backward and downward (retraction) until you feel  a stretch between your shoulder blades in your upper back. Hold for 10-30 seconds. Relax and repeat. Repeat __________ times. Complete this exercise __________ times a day. Contact a health care provider if: Your neck pain or discomfort gets worse when you do an exercise. Your neck pain or discomfort does not improve within 2 hours after you exercise. If you have any of these problems, stop exercising right away.  Do not do the exercises again unless your health care provider says that you can. Get help right away if: You develop sudden, severe neck pain. If this happens, stop exercising right away. Do not do the exercises again unless your health care provider says that you can. This information is not intended to replace advice given to you by your health care provider. Make sure you discuss any questions you have with your health care provider. Document Revised: 10/13/2020 Document Reviewed: 10/13/2020 Elsevier Patient Education  2023 [employer].

## 2021-11-11 NOTE — Progress Notes (Signed)
Established Patient Visit  Patient: Kiara Hall   DOB: 30-Apr-1981   41 y.o. Female  MRN: [id number] Visit Date: 11/11/2021  Subjective:    Chief Complaint  Patient presents with   Acute Visit    Left sided Headaches x 1 month Went to the ER on 10/27/2021, has not found relief Pap scheduled for 10/16/21 at GYN   Ear Fullness  There is pain in the left ear. This is a new problem. The current episode started in the past 7 days. The problem has been waxing and waning. There has been no fever. Associated symptoms include headaches and neck pain. Pertinent negatives include no abdominal pain, coughing, diarrhea, ear discharge, hearing loss, rash, rhinorrhea, sore throat or vomiting. She has tried acetaminophen, cold packs and heat packs for the symptoms. The treatment provided mild relief. There is no history of a chronic ear infection, hearing loss or a tympanostomy tube.  No recent dental procedure.  Tension headache, chronic Acute on chronic, occipital and neck region. No improvement with tylenol and ibuprofen Deep tissue massage caused dizziness.  We discussed possible adverse effects of imitrex and muscle relaxant on her infant while breastfeeding. She verbalized understanding and choice to take med but will pump breast milk and dump for 12-24hrs with each dose of imitrex or zanaflex. Advised to start neck and should stretch exercise and change sleep position. Alternate between warm and cold compress. F/up prn  Postpartum depression She declined use of zoloft Has ongoing counseling sessions  Reviewed medical, surgical, and social history today  Medications: Outpatient Medications Prior to Visit  Medication Sig   acetaminophen (TYLENOL) 325 MG tablet Take 2 tablets (650 mg total) by mouth every 4 (four) hours as needed for mild pain (temperature > 101.5.).   azelastine (ASTELIN) 0.1 % nasal spray Place 1 spray into both nostrils 2 (two) times daily. Use in each  nostril as directed   fluticasone (FLONASE) 50 MCG/ACT nasal spray Place 1 spray into both nostrils daily as needed for allergies or rhinitis.   ibuprofen (ADVIL) 600 MG tablet Take 1 tablet (600 mg total) by mouth every 6 (six) hours as needed.   loratadine (CLARITIN) 10 MG tablet Take 10 mg by mouth daily as needed for allergies.   Magnesium-Potassium 250-100 MG TABS Take 1 tablet by mouth daily.   [DISCONTINUED] Prenatal Vit-Fe Fumarate-FA (MULTIVITAMIN-PRENATAL) 27-0.8 MG TABS tablet Take 1 tablet by mouth daily at 12 noon. (Patient not taking: Reported on 11/11/2021)   [DISCONTINUED] Vitamin D, Ergocalciferol, (DRISDOL) 1.25 MG (50000 UNIT) CAPS capsule Take 1 capsule (50,000 Units total) by mouth every 7 (seven) days. (Patient not taking: Reported on 11/11/2021)   No facility-administered medications prior to visit.   Reviewed past medical and social history.   ROS per HPI above      Objective:  BP 120/82 (BP Location: Right Arm, Patient Position: Sitting, Cuff Size: Normal)   Pulse 93   Temp 97.6 F (36.4 C) (Temporal)   Ht 5\' 1"  (1.549 m)   Wt 217 lb 9.6 oz (98.7 kg)   SpO2 97%   BMI 41.12 kg/m      Physical Exam HENT:     Head: Normocephalic.     Jaw: There is normal jaw occlusion.     Salivary Glands: Right salivary gland is not diffusely enlarged or tender. Left salivary gland is not diffusely enlarged or tender.     Right  Ear: Ear canal and external ear normal. No decreased hearing noted. No drainage. A middle ear effusion is present. No mastoid tenderness.     Left Ear: Ear canal and external ear normal. No decreased hearing noted. No drainage. A middle ear effusion is present. No mastoid tenderness.     Nose:     Right Sinus: No maxillary sinus tenderness or frontal sinus tenderness.     Left Sinus: No maxillary sinus tenderness or frontal sinus tenderness.     Mouth/Throat:     Mouth: Mucous membranes are moist.     Pharynx: Oropharynx is clear.     Tonsils: No  tonsillar exudate.  Cardiovascular:     Rate and Rhythm: Normal rate.     Pulses: Normal pulses.  Pulmonary:     Effort: Pulmonary effort is normal.  Musculoskeletal:     Cervical back: Normal range of motion and neck supple. Tenderness present.  Lymphadenopathy:     Cervical: No cervical adenopathy.  Neurological:     Mental Status: She is oriented to person, place, and time.     Cranial Nerves: No cranial nerve deficit.  Psychiatric:        Mood and Affect: Mood is anxious.        Speech: Speech normal.        Behavior: Behavior is cooperative.     No results found for any visits on 11/11/21.    Assessment & Plan:    Problem List Items Addressed This Visit       Other   Class 2 severe obesity due to excess calories with serious comorbidity and body mass index (BMI) of 38.0 to 38.9 in adult [facility])   Postpartum depression    She declined use of zoloft Has ongoing counseling sessions      Tension headache, chronic - Primary    Acute on chronic, occipital and neck region. No improvement with tylenol and ibuprofen Deep tissue massage caused dizziness.  We discussed possible adverse effects of imitrex and muscle relaxant on her infant while breastfeeding. She verbalized understanding and choice to take med but will pump breast milk and dump for 12-24hrs with each dose of imitrex or zanaflex. Advised to start neck and should stretch exercise and change sleep position. Alternate between warm and cold compress. F/up prn      Relevant Medications   SUMAtriptan (IMITREX) 25 MG tablet   tizanidine (ZANAFLEX) 2 MG capsule   Other Visit Diagnoses     Eustachian tube dysfunction, bilateral         Use flonase and claritin for ear fullness  Return if symptoms worsen or fail to improve.     Alysia Penna, NP

## 2021-11-11 NOTE — Assessment & Plan Note (Signed)
>>  ASSESSMENT AND PLAN FOR DEPRESSION WRITTEN ON 11/11/2021  1:08 PM BY Kyira Volkert LUM, NP  She declined use of zoloft Has ongoing counseling sessions

## 2021-11-15 DIAGNOSIS — Z01419 Encounter for gynecological examination (general) (routine) without abnormal findings: Secondary | ICD-10-CM | POA: Diagnosis not present

## 2021-12-07 DIAGNOSIS — Z3202 Encounter for pregnancy test, result negative: Secondary | ICD-10-CM | POA: Diagnosis not present

## 2021-12-07 DIAGNOSIS — R42 Dizziness and giddiness: Secondary | ICD-10-CM | POA: Diagnosis not present

## 2021-12-07 DIAGNOSIS — N898 Other specified noninflammatory disorders of vagina: Secondary | ICD-10-CM | POA: Diagnosis not present

## 2021-12-07 DIAGNOSIS — R102 Pelvic and perineal pain: Secondary | ICD-10-CM | POA: Diagnosis not present

## 2021-12-07 DIAGNOSIS — N939 Abnormal uterine and vaginal bleeding, unspecified: Secondary | ICD-10-CM | POA: Diagnosis not present

## 2021-12-08 ENCOUNTER — Encounter: Payer: Self-pay | Admitting: Nurse Practitioner

## 2021-12-08 ENCOUNTER — Ambulatory Visit: Payer: Federal, State, Local not specified - PPO | Admitting: Nurse Practitioner

## 2021-12-08 ENCOUNTER — Ambulatory Visit (INDEPENDENT_AMBULATORY_CARE_PROVIDER_SITE_OTHER): Payer: Federal, State, Local not specified - PPO

## 2021-12-08 VITALS — BP 136/86 | HR 84 | Temp 97.5°F | Ht 61.0 in | Wt 212.4 lb

## 2021-12-08 DIAGNOSIS — R202 Paresthesia of skin: Secondary | ICD-10-CM | POA: Diagnosis not present

## 2021-12-08 DIAGNOSIS — M5030 Other cervical disc degeneration, unspecified cervical region: Secondary | ICD-10-CM | POA: Diagnosis not present

## 2021-12-08 DIAGNOSIS — R0789 Other chest pain: Secondary | ICD-10-CM | POA: Diagnosis not present

## 2021-12-08 DIAGNOSIS — M5412 Radiculopathy, cervical region: Secondary | ICD-10-CM

## 2021-12-08 MED ORDER — GABAPENTIN 100 MG PO CAPS: 100.0000 mg | ORAL_CAPSULE | Freq: Every day | ORAL | 5 refills | Status: DC

## 2021-12-08 NOTE — Patient Instructions (Signed)
Go to lab Will order breast US if normal x-ray Start gabapentin at bedtime. Beware of possible sedation for the baby.

## 2021-12-08 NOTE — Progress Notes (Signed)
Established Patient Visit  Patient: Kiara Hall   DOB: January 06, 1981   41 y.o. Female  MRN: [id number] Visit Date: 12/08/2021  Subjective:    Chief Complaint  Patient presents with   Acute Visit    C/o pain under left breast along with lightheadedness/ dizziness x 4 days. Says it is more of a burning sensation in her breast as well.  No other concerns    HPI Ms. Westley Hummer present with multiple complaints: intermittent left side neck pain radiates to left arm, intermittent left lower chest wall pain-tingling and burning sensation radiates with breast, dizziness and fatigue with increase pain. No related to activities with exertion. with Onset of pain 1week ago. No improvement with naproxen and muscle relaxant No muscle weakness, no numbness, no syncope, no swelling, no rash, no known injury, no palpitation, no pleuritic pain No ETOH or tobacco or hormonal use. She is currently breastfeeding her 55months old baby. She admits to carrying baby mostly on left side to complete other tasks at home. She also sleep on left side mostly, but has not been able to do so with current discomfort. No Fhx of sudden death or HCM or aneusysm Fhx of premature CAD (maternal uncle at age 38) Hx of chickenpox as a child. No Hx of shingle Hx of GAD: started use of Zoloft yesterday. Ongoing counseling sessions Hx of anemia: daily use of prenatal with iron. Repeat CBC and iron panel complete by GYN yesterday and normal. Had cardiology evaluation 06/2021: normal holter monitor and echocardiogram. She has another appt with cardiology 12/28/21.  Reviewed medical, surgical, and social history today  Medications: Outpatient Medications Prior to Visit  Medication Sig   acetaminophen (TYLENOL) 325 MG tablet Take 2 tablets (650 mg total) by mouth every 4 (four) hours as needed for mild pain (temperature > 101.5.).   azelastine (ASTELIN) 0.1 % nasal spray Place 1 spray into both nostrils 2 (two) times daily.  Use in each nostril as directed   fluticasone (FLONASE) 50 MCG/ACT nasal spray Place 1 spray into both nostrils daily as needed for allergies or rhinitis.   ibuprofen (ADVIL) 600 MG tablet Take 1 tablet (600 mg total) by mouth every 6 (six) hours as needed.   loratadine (CLARITIN) 10 MG tablet Take 10 mg by mouth daily as needed for allergies.   [DISCONTINUED] Magnesium-Potassium 250-100 MG TABS Take 1 tablet by mouth daily. (Patient not taking: Reported on 12/08/2021)   [DISCONTINUED] SUMAtriptan (IMITREX) 25 MG tablet May repeat in 2 hours if headache persists or recurs. No more than 100mg  in 24hrs. (Patient not taking: Reported on 12/08/2021)   [DISCONTINUED] tizanidine (ZANAFLEX) 2 MG capsule Take 1 capsule (2 mg total) by mouth 3 (three) times daily as needed for muscle spasms. (Patient not taking: Reported on 12/08/2021)   No facility-administered medications prior to visit.   Reviewed past medical and social history.   ROS per HPI above      Objective:  BP 136/86 (BP Location: Right Arm, Patient Position: Sitting, Cuff Size: Normal)   Pulse 84   Temp (!) 97.5 F (36.4 C) (Temporal)   Ht 5\' 1"  (1.549 m)   Wt 212 lb 6.4 oz (96.3 kg)   SpO2 98%   BMI 40.13 kg/m      Physical Exam Constitutional:      General: She is not in acute distress. Cardiovascular:     Rate and Rhythm: Normal rate.  Pulses: Normal pulses.  Pulmonary:     Effort: Pulmonary effort is normal. No respiratory distress.  Chest:     Chest wall: Tenderness present. No mass, deformity, swelling or crepitus.  Breasts:    Breasts are symmetrical.     Right: Normal.     Left: Normal.    Abdominal:     General: There is no distension.     Palpations: Abdomen is soft.     Tenderness: There is no abdominal tenderness. There is no right CVA tenderness, left CVA tenderness or guarding.  Lymphadenopathy:     Upper Body:     Left upper body: No supraclavicular, axillary or pectoral adenopathy.  Skin:     General: Skin is warm and dry.     Findings: No erythema or rash.  Neurological:     Mental Status: She is alert and oriented to person, place, and time.  Psychiatric:        Mood and Affect: Mood is anxious.     No results found for any visits on 12/08/21.    Assessment & Plan:   Possibly prodromal symptoms to herpetic outbreak vs musculoskeletal strain? Advised about possible side effects of gabapentin on infant while breastfeeding. Problem List Items Addressed This Visit   None Visit Diagnoses     Left-sided chest wall pain    -  Primary   Relevant Medications   gabapentin (NEURONTIN) 100 MG capsule   Other Relevant Orders   DG Thoracic Spine W/Swimmers   Cervical radiculopathy       Relevant Medications   gabapentin (NEURONTIN) 100 MG capsule   Other Relevant Orders   DG Cervical Spine Complete      Return if symptoms worsen or fail to improve.     Alysia Penna, NP

## 2021-12-08 NOTE — Progress Notes (Unsigned)
Initial visit for paresthesia of left chest wall. Denies injury

## 2021-12-09 NOTE — Addendum Note (Signed)
Addended by: Alysia Penna L on: 12/09/2021 02:55 PM   Modules accepted: Orders

## 2021-12-14 NOTE — Progress Notes (Signed)
HPI: FU palpitations.  Echocardiogram March 2023 showed normal LV function, basal lateral hypokinesis, mild left ventricular enlargement.  Monitor March 2023 showed normal sinus rhythm with 4 beats nonsustained ventricular tachycardia and PVCs.  Her symptoms are felt secondary to PVCs.  Since last seen her palpitations have improved.  She occasionally feels dyspnea but there is no dyspnea on exertion.  No orthopnea, PND, pedal edema, chest pain or syncope.  She has an occasional fluttering sensation in her neck but she records rhythm strips with her Apple Watch and this has demonstrated sinus rhythm.  Current Outpatient Medications  Medication Sig Dispense Refill   acetaminophen (TYLENOL) 325 MG tablet Take 2 tablets (650 mg total) by mouth every 4 (four) hours as needed for mild pain (temperature > 101.5.).     azelastine (ASTELIN) 0.1 % nasal spray Place 1 spray into both nostrils 2 (two) times daily. Use in each nostril as directed 30 mL 5   fluticasone (FLONASE) 50 MCG/ACT nasal spray Place 1 spray into both nostrils daily as needed for allergies or rhinitis.     gabapentin (NEURONTIN) 100 MG capsule Take 1 capsule (100 mg total) by mouth at bedtime. 30 capsule 5   hydrOXYzine (ATARAX) 10 MG tablet Take 1 tablet (10 mg total) by mouth every 8 (eight) hours as needed. 30 tablet 1   ibuprofen (ADVIL) 600 MG tablet Take 1 tablet (600 mg total) by mouth every 6 (six) hours as needed. 30 tablet 1   loratadine (CLARITIN) 10 MG tablet Take 10 mg by mouth daily as needed for allergies.     No current facility-administered medications for this visit.     Past Medical History:  Diagnosis Date   Anemia    Anxiety    Chicken pox    Complication of anesthesia    hypotension with one of the spinal   ELEVATED BP READING WITHOUT DX HYPERTENSION 12/17/2007   Qualifier: Diagnosis of  By: Andrey Campanile MD, Raliegh Ip     GERD (gastroesophageal reflux disease)    Palpitation     Past Surgical History:   Procedure Laterality Date   BREAST BIOPSY Left    benign    CESAREAN SECTION N/A 02/09/2015   Procedure: CESAREAN SECTION;  Surgeon: Gerald Leitz, MD;  Location: [facility];  Service: Obstetrics;  Laterality: N/A;   CESAREAN SECTION N/A 02/05/2019   Procedure: CESAREAN SECTION;  Surgeon: Gerald Leitz, MD;  Location: [facility];  Service: Obstetrics;  Laterality: N/A;   CESAREAN SECTION Bilateral 04/22/2021   Procedure: REPEAT CESAREAN SECTION WITH BILATERAL SALPINGECTOMY;  Surgeon: Gerald Leitz, MD;  Location: [facility];  Service: Obstetrics;  Laterality: Bilateral;   TONSILLECTOMY      Social History   Socioeconomic History   Marital status: Married    Spouse name: Not on file   Number of children: 3   Years of education: Not on file   Highest education level: Not on file  Occupational History   Not on file  Tobacco Use   Smoking status: Never   Smokeless tobacco: Never  Vaping Use   Vaping Use: Never used  Substance and Sexual Activity   Alcohol use: Yes    Comment: Occasional   Drug use: No   Sexual activity: Yes    Birth control/protection: Condom  Other Topics Concern   Not on file  Social History Narrative   Not on file   Social Determinants of Health   Financial Resource Strain: Not on  file  Food Insecurity: Not on file  Transportation Needs: Not on file  Physical Activity: Not on file  Stress: Not on file  Social Connections: Not on file  Intimate Partner Violence: Not on file    Family History  Problem Relation Age of Onset   Diabetes Mother    Hypertension Mother    Heart disease Maternal Uncle 22   Hypertension Maternal Grandfather    Miscarriages / Stillbirths Sister    Hypertension Maternal Aunt    Stroke Maternal Grandmother     ROS: no fevers or chills, productive cough, hemoptysis, dysphasia, odynophagia, melena, hematochezia, dysuria, hematuria, rash, seizure activity, orthopnea, PND, pedal edema, claudication. Remaining systems are  negative.  Physical Exam: Well-developed well-nourished in no acute distress.  Skin is warm and dry.  HEENT is normal.  Neck is supple.  Chest is clear to auscultation with normal expansion.  Cardiovascular exam is regular rate and rhythm.  Abdominal exam nontender or distended. No masses palpated. Extremities show no edema. neuro grossly intact  A/P  1 palpitations-symptoms have improved compared to previous.  I have provided a prescription for Toprol 25 mg nightly as needed if her symptoms worsen.  2 obesity-we discussed the importance of diet and exercise.  Olga Millers, MD

## 2021-12-23 ENCOUNTER — Encounter: Payer: Self-pay | Admitting: Nurse Practitioner

## 2021-12-23 ENCOUNTER — Ambulatory Visit (INDEPENDENT_AMBULATORY_CARE_PROVIDER_SITE_OTHER): Payer: Federal, State, Local not specified - PPO | Admitting: Nurse Practitioner

## 2021-12-23 VITALS — BP 124/82 | HR 82 | Temp 98.3°F | Ht 61.0 in | Wt 215.4 lb

## 2021-12-23 DIAGNOSIS — F53 Postpartum depression: Secondary | ICD-10-CM | POA: Diagnosis not present

## 2021-12-23 DIAGNOSIS — Z13220 Encounter for screening for lipoid disorders: Secondary | ICD-10-CM | POA: Diagnosis not present

## 2021-12-23 DIAGNOSIS — E559 Vitamin D deficiency, unspecified: Secondary | ICD-10-CM | POA: Diagnosis not present

## 2021-12-23 DIAGNOSIS — Z136 Encounter for screening for cardiovascular disorders: Secondary | ICD-10-CM | POA: Diagnosis not present

## 2021-12-23 DIAGNOSIS — O133 Gestational [pregnancy-induced] hypertension without significant proteinuria, third trimester: Secondary | ICD-10-CM | POA: Insufficient documentation

## 2021-12-23 DIAGNOSIS — Z0001 Encounter for general adult medical examination with abnormal findings: Secondary | ICD-10-CM | POA: Diagnosis not present

## 2021-12-23 DIAGNOSIS — M5430 Sciatica, unspecified side: Secondary | ICD-10-CM | POA: Insufficient documentation

## 2021-12-23 DIAGNOSIS — M5432 Sciatica, left side: Secondary | ICD-10-CM | POA: Insufficient documentation

## 2021-12-23 LAB — LIPID PANEL
Cholesterol: 207 mg/dL — ABNORMAL HIGH (ref 0–200)
HDL: 83 mg/dL (ref >39.00)
LDL Cholesterol: 115 mg/dL — ABNORMAL HIGH (ref 0–99)
NonHDL: 123.72
Total CHOL/HDL Ratio: 2
Triglycerides: 44 mg/dL (ref 0.0–149.0)
VLDL: 8.8 mg/dL (ref 0.0–40.0)

## 2021-12-23 LAB — VITAMIN D 25 HYDROXY (VIT D DEFICIENCY, FRACTURES): VITD: 28.71 ng/mL — ABNORMAL LOW (ref 30.00–100.00)

## 2021-12-23 MED ORDER — HYDROXYZINE HCL 10 MG PO TABS: 10.0000 mg | ORAL_TABLET | Freq: Three times a day (TID) | ORAL | 1 refills | Status: DC | PRN

## 2021-12-23 NOTE — Patient Instructions (Signed)
Go to lab  Preventive Care 40-40 Years Old, Female Preventive care refers to lifestyle choices and visits with your health care provider that can promote health and wellness. Preventive care visits are also called wellness exams. What can I expect for my preventive care visit? Counseling Your health care provider may ask you questions about your: Medical history, including: Past medical problems. Family medical history. Pregnancy history. Current health, including: Menstrual cycle. Method of birth control. Emotional well-being. Home life and relationship well-being. Sexual activity and sexual health. Lifestyle, including: Alcohol, nicotine or tobacco, and drug use. Access to firearms. Diet, exercise, and sleep habits. Work and work environment. Sunscreen use. Safety issues such as seatbelt and bike helmet use. Physical exam Your health care provider will check your: Height and weight. These may be used to calculate your BMI (body mass index). BMI is a measurement that tells if you are at a healthy weight. Waist circumference. This measures the distance around your waistline. This measurement also tells if you are at a healthy weight and may help predict your risk of certain diseases, such as type 2 diabetes and high blood pressure. Heart rate and blood pressure. Body temperature. Skin for abnormal spots. What immunizations do I need?  Vaccines are usually given at various ages, according to a schedule. Your health care provider will recommend vaccines for you based on your age, medical history, and lifestyle or other factors, such as travel or where you work. What tests do I need? Screening Your health care provider may recommend screening tests for certain conditions. This may include: Lipid and cholesterol levels. Diabetes screening. This is done by checking your blood sugar (glucose) after you have not eaten for a while (fasting). Pelvic exam and Pap test. Hepatitis B  test. Hepatitis C test. HIV (human immunodeficiency virus) test. STI (sexually transmitted infection) testing, if you are at risk. Lung cancer screening. Colorectal cancer screening. Mammogram. Talk with your health care provider about when you should start having regular mammograms. This may depend on whether you have a family history of breast cancer. BRCA-related cancer screening. This may be done if you have a family history of breast, ovarian, tubal, or peritoneal cancers. Bone density scan. This is done to screen for osteoporosis. Talk with your health care provider about your test results, treatment options, and if necessary, the need for more tests. Follow these instructions at home: Eating and drinking  Eat a diet that includes fresh fruits and vegetables, whole grains, lean protein, and low-fat dairy products. Take vitamin and mineral supplements as recommended by your health care provider. Do not drink alcohol if: Your health care provider tells you not to drink. You are pregnant, may be pregnant, or are planning to become pregnant. If you drink alcohol: Limit how much you have to 0-1 drink a day. Know how much alcohol is in your drink. In the [place] one drink equals one 12 oz bottle of beer (355 mL), one 5 oz glass of wine (148 mL), or one 1 oz glass of hard liquor (44 mL). Lifestyle Brush your teeth every morning and night with fluoride toothpaste. Floss one time each day. Exercise for at least 30 minutes 5 or more days each week. Do not use any products that contain nicotine or tobacco. These products include cigarettes, chewing tobacco, and vaping devices, such as e-cigarettes. If you need help quitting, ask your health care provider. Do not use drugs. If you are sexually active, practice safe sex. Use a condom or other   form of protection to prevent STIs. If you do not wish to become pregnant, use a form of birth control. If you plan to become pregnant, see your health care  provider for a prepregnancy visit. Take aspirin only as told by your health care provider. Make sure that you understand how much to take and what form to take. Work with your health care provider to find out whether it is safe and beneficial for you to take aspirin daily. Find healthy ways to manage stress, such as: Meditation, yoga, or listening to music. Journaling. Talking to a trusted person. Spending time with friends and family. Minimize exposure to UV radiation to reduce your risk of skin cancer. Safety Always wear your seat belt while driving or riding in a vehicle. Do not drive: If you have been drinking alcohol. Do not ride with someone who has been drinking. When you are tired or distracted. While texting. If you have been using any mind-altering substances or drugs. Wear a helmet and other protective equipment during sports activities. If you have firearms in your house, make sure you follow all gun safety procedures. Seek help if you have been physically or sexually abused. What's next? Visit your health care provider once a year for an annual wellness visit. Ask your health care provider how often you should have your eyes and teeth checked. Stay up to date on all vaccines. This information is not intended to replace advice given to you by your health care provider. Make sure you discuss any questions you have with your health care provider. Document Revised: 10/14/2020 Document Reviewed: 10/14/2020 Elsevier Patient Education  [place].

## 2021-12-23 NOTE — Progress Notes (Signed)
Complete physical exam  Patient: Kiara Hall   DOB: 10/06/1980   41 y.o. Female  MRN: [id number] Visit Date: 12/23/2021  Subjective:    Chief Complaint  Patient presents with   Annual Exam    CPE Pt fasting  anxiety med options Requesting records for PAP   Kearie P Ng is a 41 y.o. female who presents today for a complete physical exam. She reports consuming a low fat diet.  Walking   She generally feels well. She reports sleeping fairly well. She does have additional problems to discuss today.  Vision:No Dental:No STD Screen:No  Last PAP 10/21/2020: normal PAP with negative HPV.     12/23/2021    9:58 AM 08/20/2021    8:52 AM 08/02/2021    1:52 PM  Depression screen PHQ 2/9  Decreased Interest 0 0 0  Down, Depressed, Hopeless 0 0 0  PHQ - 2 Score 0 0 0  Altered sleeping 0    Tired, decreased energy 0    Change in appetite 0    Feeling bad or failure about yourself  1    Trouble concentrating 0    Moving slowly or fidgety/restless 0    Suicidal thoughts 0    PHQ-9 Score 1    Difficult doing work/chores Not difficult at all         12/23/2021    9:59 AM 11/29/2019    9:43 AM 08/23/2019    8:06 AM  GAD 7 : Generalized Anxiety Score  Nervous, Anxious, on Edge 1 0 1  Control/stop worrying 0 0 0  Worry too much - different things 0 0 0  Trouble relaxing 0 0 0  Restless 0 0 0  Easily annoyed or irritable 1 0 0  Afraid - awful might happen 1 0 0  Total GAD 7 Score 3 0 1  Anxiety Difficulty Not difficult at all     Most recent fall risk assessment:    12/23/2021    9:46 AM  Fall Risk   Falls in the past year? 0  Number falls in past yr: 0  Injury with Fall? 0     Most recent depression screenings:    12/23/2021    9:58 AM 08/20/2021    8:52 AM  PHQ 2/9 Scores  PHQ - 2 Score 0 0  PHQ- 9 Score 1     HPI  Postpartum depression Reports Flushing sensation with zoloft and Dizziness with buspar. She has ongoing sessions with therapist. We discussed  using cymbalta vs lexapro vs vistaril. She opted to use vistaril prn. She was advised about possible side effects in relation to breastfeeding. Advised to continue sessions with therapist   Past Medical History:  Diagnosis Date   Anemia    Anxiety    Chicken pox    Complication of anesthesia    hypotension with one of the spinal   ELEVATED BP READING WITHOUT DX HYPERTENSION 12/17/2007   Qualifier: Diagnosis of  By: Andrey Campanile MD, Raliegh Ip     GERD (gastroesophageal reflux disease)    Palpitation    Past Surgical History:  Procedure Laterality Date   BREAST BIOPSY Left    benign    CESAREAN SECTION N/A 02/09/2015   Procedure: CESAREAN SECTION;  Surgeon: Gerald Leitz, MD;  Location: [facility];  Service: Obstetrics;  Laterality: N/A;   CESAREAN SECTION N/A 02/05/2019   Procedure: CESAREAN SECTION;  Surgeon: Gerald Leitz, MD;  Location: [facility];  Service: Obstetrics;  Laterality: N/A;  CESAREAN SECTION Bilateral 04/22/2021   Procedure: REPEAT CESAREAN SECTION WITH BILATERAL SALPINGECTOMY;  Surgeon: Gerald Leitz, MD;  Location: [facility];  Service: Obstetrics;  Laterality: Bilateral;   TONSILLECTOMY     Social History   Socioeconomic History   Marital status: Married    Spouse name: Not on file   Number of children: 3   Years of education: Not on file   Highest education level: Not on file  Occupational History   Not on file  Tobacco Use   Smoking status: Never   Smokeless tobacco: Never  Vaping Use   Vaping Use: Never used  Substance and Sexual Activity   Alcohol use: Yes    Comment: Occasional   Drug use: No   Sexual activity: Yes    Birth control/protection: Condom  Other Topics Concern   Not on file  Social History Narrative   Not on file   Social Determinants of Health   Financial Resource Strain: Not on file  Food Insecurity: Not on file  Transportation Needs: Not on file  Physical Activity: Not on file  Stress: Not on file  Social Connections: Not on file   Intimate Partner Violence: Not on file   Family Status  Relation Name Status   Mother  Alive   Mat Uncle  Deceased   MGF  Deceased   Sister  Alive   Father  Alive   Mat Aunt  Alive   MGM  Deceased   PGM  Deceased   PGF  Deceased   Family History  Problem Relation Age of Onset   Diabetes Mother    Hypertension Mother    Heart disease Maternal Uncle 32   Hypertension Maternal Grandfather    Miscarriages / Stillbirths Sister    Hypertension Maternal Aunt    Stroke Maternal Grandmother    Allergies  Allergen Reactions   Metronidazole Hives and Shortness Of Breath    Flagyl/rash    Patient Care Team: Mariam Helbert, Bonna Gains, NP as PCP - General (Internal Medicine) Lewayne Bunting, MD as PCP - Cardiology (Cardiology) Gerald Leitz, MD as Consulting Physician (Obstetrics and Gynecology)   Medications: Outpatient Medications Prior to Visit  Medication Sig   acetaminophen (TYLENOL) 325 MG tablet Take 2 tablets (650 mg total) by mouth every 4 (four) hours as needed for mild pain (temperature > 101.5.).   azelastine (ASTELIN) 0.1 % nasal spray Place 1 spray into both nostrils 2 (two) times daily. Use in each nostril as directed   fluticasone (FLONASE) 50 MCG/ACT nasal spray Place 1 spray into both nostrils daily as needed for allergies or rhinitis.   gabapentin (NEURONTIN) 100 MG capsule Take 1 capsule (100 mg total) by mouth at bedtime.   loratadine (CLARITIN) 10 MG tablet Take 10 mg by mouth daily as needed for allergies.   ibuprofen (ADVIL) 600 MG tablet Take 1 tablet (600 mg total) by mouth every 6 (six) hours as needed. (Patient not taking: Reported on 12/23/2021)   No facility-administered medications prior to visit.    Review of Systems  Constitutional:  Negative for fever.  HENT:  Negative for congestion and sore throat.   Eyes:        Negative for visual changes  Respiratory:  Negative for cough and shortness of breath.   Cardiovascular:  Negative for chest pain,  palpitations and leg swelling.  Gastrointestinal:  Negative for blood in stool, constipation and diarrhea.  Genitourinary:  Negative for dysuria, frequency and urgency.  Musculoskeletal:  Positive  for neck pain. Negative for myalgias.  Skin:  Negative for rash.  Neurological:  Negative for dizziness and headaches.  Hematological:  Does not bruise/bleed easily.  Psychiatric/Behavioral:  Positive for agitation and sleep disturbance. Negative for hallucinations, self-injury and suicidal ideas. The patient is nervous/anxious. The patient is not hyperactive.    Last CBC Lab Results  Component Value Date   WBC 6.4 10/27/2021   HGB 12.7 10/27/2021   HCT 40.2 10/27/2021   MCV 78.2 (L) 10/27/2021   MCH 24.7 (L) 10/27/2021   RDW 17.3 (H) 10/27/2021   PLT 234 10/27/2021   Last metabolic panel Lab Results  Component Value Date   GLUCOSE 96 10/27/2021   NA 138 10/27/2021   K 3.6 10/27/2021   CL 102 10/27/2021   CO2 24 10/27/2021   BUN 10 10/27/2021   CREATININE 0.60 10/27/2021   GFRNONAA >60 10/27/2021   CALCIUM 9.7 10/27/2021   PROT 6.1 (L) 04/20/2021   ALBUMIN 2.7 (L) 04/20/2021   BILITOT 1.1 04/20/2021   ALKPHOS 83 04/20/2021   AST 21 04/20/2021   ALT 13 04/20/2021   ANIONGAP 12 10/27/2021   Last lipids Lab Results  Component Value Date   CHOL 168 06/10/2020   HDL 76.00 06/10/2020   LDLCALC 84 06/10/2020   TRIG 39.0 06/10/2020   CHOLHDL 2 06/10/2020      Objective:  BP 124/82 (BP Location: Right Arm, Patient Position: Sitting, Cuff Size: Normal)   Pulse 82   Temp 98.3 F (36.8 C) (Temporal)   Ht 5\' 1"  (1.549 m)   Wt 215 lb 6.4 oz (97.7 kg)   SpO2 95%   BMI 40.70 kg/m     BP Readings from Last 3 Encounters:  12/23/21 124/82  12/08/21 136/86  11/11/21 120/82   Wt Readings from Last 3 Encounters:  12/23/21 215 lb 6.4 oz (97.7 kg)  12/08/21 212 lb 6.4 oz (96.3 kg)  11/11/21 217 lb 9.6 oz (98.7 kg)   Physical Exam Vitals reviewed.  Constitutional:       General: She is not in acute distress. HENT:     Right Ear: Hearing, ear canal and external ear normal. No drainage, swelling or tenderness. A middle ear effusion is present. There is no impacted cerumen. Tympanic membrane is not injected, scarred, perforated, erythematous or bulging.     Left Ear: Hearing, ear canal and external ear normal. No drainage, swelling or tenderness. A middle ear effusion is present. There is no impacted cerumen. Tympanic membrane is not injected, scarred, perforated, erythematous or bulging.     Nose: Nose normal.     Mouth/Throat:     Pharynx: No oropharyngeal exudate.  Eyes:     General: No scleral icterus. Cardiovascular:     Rate and Rhythm: Normal rate and regular rhythm.     Pulses: Normal pulses.     Heart sounds: Normal heart sounds.  Pulmonary:     Effort: Pulmonary effort is normal. No respiratory distress.     Breath sounds: Normal breath sounds.  Abdominal:     General: There is no distension.     Palpations: Abdomen is soft.  Genitourinary:    Comments: Deferred breast and pelvic exam to GYN Musculoskeletal:        General: Normal range of motion.     Cervical back: Normal range of motion and neck supple.     Right lower leg: No edema.     Left lower leg: No edema.  Lymphadenopathy:  Cervical: No cervical adenopathy.  Skin:    General: Skin is warm and dry.  Neurological:     Mental Status: She is alert and oriented to person, place, and time.  Psychiatric:        Mood and Affect: Mood normal.        Behavior: Behavior normal.        Thought Content: Thought content normal.     No results found for any visits on 12/23/21.    Assessment & Plan:    Routine Health Maintenance and Physical Exam  Immunization History  Administered Date(s) Administered   Moderna Sars-Covid-2 Vaccination 07/29/2019, 08/26/2019   PFIZER(Purple Top)SARS-COV-2 Vaccination 07/26/2019, 08/26/2019   Tdap 12/11/2014, 12/17/2018, 03/02/2021    Health  Maintenance  Topic Date Due   INFLUENZA VACCINE  11/30/2021   COVID-19 Vaccine (5 - Mixed Product series) 12/24/2021 (Originally 10/21/2019)   Hepatitis C Screening  12/24/2022 (Originally 06/11/1999)   PAP SMEAR-Modifier  10/21/2024   TETANUS/TDAP  03/03/2031   HIV Screening  Completed   HPV VACCINES  Aged Out   Discussed health benefits of physical activity, and encouraged her to engage in regular exercise appropriate for her age and condition.  Problem List Items Addressed This Visit       Other   Postpartum depression    Reports Flushing sensation with zoloft and Dizziness with buspar. She has ongoing sessions with therapist. We discussed using cymbalta vs lexapro vs vistaril. She opted to use vistaril prn. She was advised about possible side effects in relation to breastfeeding. Advised to continue sessions with therapist      Relevant Medications   hydrOXYzine (ATARAX) 10 MG tablet   Vitamin D deficiency   Relevant Orders   Vitamin D (25 hydroxy)   Other Visit Diagnoses     Encounter for preventative adult health care exam with abnormal findings    -  Primary   Relevant Orders   Lipid panel   Encounter for lipid screening for cardiovascular disease       Relevant Orders   Lipid panel      Return in about 1 year (around 12/24/2022) for CPE (fasting).     Alysia Penna, NP

## 2021-12-23 NOTE — Assessment & Plan Note (Signed)
>>  ASSESSMENT AND PLAN FOR DEPRESSION WRITTEN ON 12/23/2021 12:19 PM BY Trinetta Alemu LUM, NP  Reports Flushing sensation with zoloft and Dizziness with buspar . She has ongoing sessions with therapist. We discussed using cymbalta vs lexapro vs vistaril . She opted to use vistaril  prn. She was advised about possible side effects in relation to breastfeeding. Advised to continue sessions with therapist

## 2021-12-23 NOTE — Assessment & Plan Note (Signed)
Reports Flushing sensation with zoloft and Dizziness with buspar. She has ongoing sessions with therapist. We discussed using cymbalta vs lexapro vs vistaril. She opted to use vistaril prn. She was advised about possible side effects in relation to breastfeeding. Advised to continue sessions with therapist

## 2021-12-28 ENCOUNTER — Encounter: Payer: Self-pay | Admitting: Cardiology

## 2021-12-28 ENCOUNTER — Encounter: Payer: Self-pay | Admitting: *Deleted

## 2021-12-28 ENCOUNTER — Ambulatory Visit: Payer: Federal, State, Local not specified - PPO | Attending: Cardiology | Admitting: Cardiology

## 2021-12-28 VITALS — BP 118/80 | HR 84 | Ht 61.0 in | Wt 214.8 lb

## 2021-12-28 DIAGNOSIS — R002 Palpitations: Secondary | ICD-10-CM | POA: Diagnosis not present

## 2021-12-28 MED ORDER — METOPROLOL SUCCINATE ER 25 MG PO TB24: ORAL_TABLET | ORAL | 3 refills | Status: DC

## 2021-12-28 NOTE — Patient Instructions (Signed)
Medication Instructions:    MAY TAKE METOPROLOL SUCC ER 25 MG ONCE DAILY AT BEDTIME AS NEEDED  *If you need a refill on your cardiac medications before your next appointment, please call your pharmacy*   Follow-Up: At [facility], you and your health needs are our priority.  As part of our continuing mission to provide you with exceptional heart care, we have created designated Provider Care Teams.  These Care Teams include your primary Cardiologist (physician) and Advanced Practice Providers (APPs -  Physician Assistants and Nurse Practitioners) who all work together to provide you with the care you need, when you need it.  We recommend signing up for the patient portal called "MyChart".  Sign up information is provided on this After Visit Summary.  MyChart is used to connect with patients for Virtual Visits (Telemedicine).  Patients are able to view lab/test results, encounter notes, upcoming appointments, etc.  Non-urgent messages can be sent to your provider as well.   To learn more about what you can do with MyChart, go to [email or web address].    Your next appointment:   12 month(s)  The format for your next appointment:   In Person  Provider:   Olga Millers, MD

## 2022-01-06 ENCOUNTER — Ambulatory Visit: Payer: Federal, State, Local not specified - PPO | Admitting: Nurse Practitioner

## 2022-01-06 ENCOUNTER — Encounter: Payer: Self-pay | Admitting: Cardiology

## 2022-01-06 NOTE — Telephone Encounter (Signed)
Patient stated she feels fluttering in her chest and neck. Her tongue feels tingling. Has occasional sob. She has not started prn dose of metoprolol succinate 25mg . Patient education on the med. Today, BP 133/80, P 80. Made appointment with E. Monge for tomorrow.

## 2022-01-07 ENCOUNTER — Ambulatory Visit: Payer: Federal, State, Local not specified - PPO | Admitting: Nurse Practitioner

## 2022-01-07 NOTE — Progress Notes (Deleted)
Office Visit    Patient Name: Kiara Hall Date of Encounter: 01/07/2022  Primary Care Provider:  Anne Ng, NP Primary Cardiologist:  Olga Millers, MD  Chief Complaint    41 year old female with a history of palpitations, NSVT, PVCs, GERD, anxiety, and obesity who presents for follow-up to palpitations.  Past Medical History    Past Medical History:  Diagnosis Date   Anemia    Anxiety    Chicken pox    Complication of anesthesia    hypotension with one of the spinal   ELEVATED BP READING WITHOUT DX HYPERTENSION 12/17/2007   Qualifier: Diagnosis of  By: Andrey Campanile MD, Raliegh Ip     GERD (gastroesophageal reflux disease)    Palpitation    Past Surgical History:  Procedure Laterality Date   BREAST BIOPSY Left    benign    CESAREAN SECTION N/A 02/09/2015   Procedure: CESAREAN SECTION;  Surgeon: Gerald Leitz, MD;  Location: [facility];  Service: Obstetrics;  Laterality: N/A;   CESAREAN SECTION N/A 02/05/2019   Procedure: CESAREAN SECTION;  Surgeon: Gerald Leitz, MD;  Location: [facility];  Service: Obstetrics;  Laterality: N/A;   CESAREAN SECTION Bilateral 04/22/2021   Procedure: REPEAT CESAREAN SECTION WITH BILATERAL SALPINGECTOMY;  Surgeon: Gerald Leitz, MD;  Location: [facility];  Service: Obstetrics;  Laterality: Bilateral;   TONSILLECTOMY      Allergies  Allergies  Allergen Reactions   Metronidazole Hives and Shortness Of Breath    Flagyl/rash    History of Present Illness    41 year old female with the above past medical history including palpitations, NSVT, PVCs, GERD, anxiety, and obesity.  She was previously evaluated by cardiology in 2009. She was later evaluated by Dr. Jens Som in February 2023 in the setting of palpitations. Echocardiogram in March 2023 showed normal LV function, basal lateral hypokinesis, mild LV enlargement.  Outpatient cardiac monitor March 2023 showed normal sinus rhythm, 4 beats of NSVT, PVCs.  Her symptoms of palpitations were thought  to be secondary to PVCs.  Was last seen in the office on 12/28/2021 and reported some improvement in her palpitations.  She was started on metoprolol 25 mg nightly as needed.  He contacted our office on 01/06/2022 and reported fluttering sensation in her chest and neck, tingling in her tongue, occasional shortness of breath.  She did not take metoprolol. She requested an OV to discuss additional testing options.  She presents today for follow-up.  Since her last visit and since she contacted our office  Palpitations/NSVT/PVCs: GERD: Anxiety: Obesity: Disposition:   Home Medications    Current Outpatient Medications  Medication Sig Dispense Refill   acetaminophen (TYLENOL) 325 MG tablet Take 2 tablets (650 mg total) by mouth every 4 (four) hours as needed for mild pain (temperature > 101.5.).     azelastine (ASTELIN) 0.1 % nasal spray Place 1 spray into both nostrils 2 (two) times daily. Use in each nostril as directed 30 mL 5   fluticasone (FLONASE) 50 MCG/ACT nasal spray Place 1 spray into both nostrils daily as needed for allergies or rhinitis.     gabapentin (NEURONTIN) 100 MG capsule Take 1 capsule (100 mg total) by mouth at bedtime. 30 capsule 5   hydrOXYzine (ATARAX) 10 MG tablet Take 1 tablet (10 mg total) by mouth every 8 (eight) hours as needed. 30 tablet 1   ibuprofen (ADVIL) 600 MG tablet Take 1 tablet (600 mg total) by mouth every 6 (six) hours as needed. 30 tablet 1  loratadine (CLARITIN) 10 MG tablet Take 10 mg by mouth daily as needed for allergies.     metoprolol succinate (TOPROL XL) 25 MG 24 hr tablet Take one tablet once daily at bedtime as needed 90 tablet 3   No current facility-administered medications for this visit.     Review of Systems    ***.  All other systems reviewed and are otherwise negative except as noted above.    Physical Exam    VS:  There were no vitals taken for this visit. , BMI There is no height or weight on file to calculate BMI.     GEN:  Well nourished, well developed, in no acute distress. HEENT: normal. Neck: Supple, no JVD, carotid bruits, or masses. Cardiac: RRR, no murmurs, rubs, or gallops. No clubbing, cyanosis, edema.  Radials/DP/PT 2+ and equal bilaterally.  Respiratory:  Respirations regular and unlabored, clear to auscultation bilaterally. GI: Soft, nontender, nondistended, BS + x 4. MS: no deformity or atrophy. Skin: warm and dry, no rash. Neuro:  Strength and sensation are intact. Psych: Normal affect.  Accessory Clinical Findings    ECG personally reviewed by me today - *** - no acute changes.   Lab Results  Component Value Date   WBC 6.4 10/27/2021   HGB 12.7 10/27/2021   HCT 40.2 10/27/2021   MCV 78.2 (L) 10/27/2021   PLT 234 10/27/2021   Lab Results  Component Value Date   CREATININE 0.60 10/27/2021   BUN 10 10/27/2021   NA 138 10/27/2021   K 3.6 10/27/2021   CL 102 10/27/2021   CO2 24 10/27/2021   Lab Results  Component Value Date   ALT 13 04/20/2021   AST 21 04/20/2021   ALKPHOS 83 04/20/2021   BILITOT 1.1 04/20/2021   Lab Results  Component Value Date   CHOL 207 (H) 12/23/2021   HDL 83.00 12/23/2021   LDLCALC 115 (H) 12/23/2021   TRIG 44.0 12/23/2021   CHOLHDL 2 12/23/2021    Lab Results  Component Value Date   HGBA1C 5.7 06/10/2020    Assessment & Plan    1.  ***  No BP recorded.  {Refresh Note OR Click here to enter BP  :1}***   Joylene Grapes, NP 01/07/2022, 7:08 AM

## 2022-01-11 DIAGNOSIS — M5030 Other cervical disc degeneration, unspecified cervical region: Secondary | ICD-10-CM | POA: Diagnosis not present

## 2022-01-11 DIAGNOSIS — M5412 Radiculopathy, cervical region: Secondary | ICD-10-CM | POA: Diagnosis not present

## 2022-01-16 DIAGNOSIS — J069 Acute upper respiratory infection, unspecified: Secondary | ICD-10-CM | POA: Diagnosis not present

## 2022-01-17 ENCOUNTER — Encounter: Payer: Self-pay | Admitting: Cardiology

## 2022-01-17 ENCOUNTER — Encounter: Payer: Self-pay | Admitting: *Deleted

## 2022-01-17 ENCOUNTER — Ambulatory Visit (INDEPENDENT_AMBULATORY_CARE_PROVIDER_SITE_OTHER): Payer: Federal, State, Local not specified - PPO

## 2022-01-17 ENCOUNTER — Ambulatory Visit: Payer: Federal, State, Local not specified - PPO | Attending: Cardiology | Admitting: Cardiology

## 2022-01-17 ENCOUNTER — Ambulatory Visit: Payer: Federal, State, Local not specified - PPO | Admitting: Cardiology

## 2022-01-17 VITALS — BP 132/98 | HR 92 | Ht 61.0 in | Wt 215.0 lb

## 2022-01-17 DIAGNOSIS — R002 Palpitations: Secondary | ICD-10-CM

## 2022-01-17 MED ORDER — METOPROLOL SUCCINATE ER 25 MG PO TB24: 25.0000 mg | ORAL_TABLET | Freq: Every day | ORAL | 3 refills | Status: DC

## 2022-01-17 NOTE — Progress Notes (Signed)
HPI: FU palpitations.  Echocardiogram March 2023 showed normal LV function, basal lateral hypokinesis, mild left ventricular enlargement.  Monitor March 2023 showed normal sinus rhythm with 4 beats nonsustained ventricular tachycardia and PVCs.  Her symptoms are felt secondary to PVCs.  Since last seen she does not have dyspnea on exertion, orthopnea, PND, pedal edema, exertional chest pain or syncope.  However she notices a new sensation in her chest.  It is in the upper chest and neck described as a flutter.  It is brief but recurrent.  It happens daily and she is extremely concerned about these symptoms.  Current Outpatient Medications  Medication Sig Dispense Refill   acetaminophen (TYLENOL) 325 MG tablet Take 2 tablets (650 mg total) by mouth every 4 (four) hours as needed for mild pain (temperature > 101.5.).     azelastine (ASTELIN) 0.1 % nasal spray Place 1 spray into both nostrils 2 (two) times daily. Use in each nostril as directed 30 mL 5   fluticasone (FLONASE) 50 MCG/ACT nasal spray Place 1 spray into both nostrils daily as needed for allergies or rhinitis.     gabapentin (NEURONTIN) 100 MG capsule Take 1 capsule (100 mg total) by mouth at bedtime. 30 capsule 5   hydrOXYzine (ATARAX) 10 MG tablet Take 1 tablet (10 mg total) by mouth every 8 (eight) hours as needed. 30 tablet 1   ibuprofen (ADVIL) 600 MG tablet Take 1 tablet (600 mg total) by mouth every 6 (six) hours as needed. 30 tablet 1   loratadine (CLARITIN) 10 MG tablet Take 10 mg by mouth daily as needed for allergies.     metoprolol succinate (TOPROL XL) 25 MG 24 hr tablet Take one tablet once daily at bedtime as needed 90 tablet 3   No current facility-administered medications for this visit.     Past Medical History:  Diagnosis Date   Anemia    Anxiety    Chicken pox    Complication of anesthesia    hypotension with one of the spinal   ELEVATED BP READING WITHOUT DX HYPERTENSION 12/17/2007   Qualifier: Diagnosis  of  By: Redmond Pulling MD, Frann Rider     GERD (gastroesophageal reflux disease)    Palpitation     Past Surgical History:  Procedure Laterality Date   BREAST BIOPSY Left    benign    CESAREAN SECTION N/A 02/09/2015   Procedure: CESAREAN SECTION;  Surgeon: [name], MD;  Location: [facility];  Service: Obstetrics;  Laterality: N/A;   CESAREAN SECTION N/A 02/05/2019   Procedure: CESAREAN SECTION;  Surgeon: [name], MD;  Location: [facility];  Service: Obstetrics;  Laterality: N/A;   CESAREAN SECTION Bilateral 04/22/2021   Procedure: REPEAT CESAREAN SECTION WITH BILATERAL SALPINGECTOMY;  Surgeon: [name], MD;  Location: [facility];  Service: Obstetrics;  Laterality: Bilateral;   TONSILLECTOMY      Social History   Socioeconomic History   Marital status: Married    Spouse name: Not on file   Number of children: 3   Years of education: Not on file   Highest education level: Not on file  Occupational History   Not on file  Tobacco Use   Smoking status: Never   Smokeless tobacco: Never  Vaping Use   Vaping Use: Never used  Substance and Sexual Activity   Alcohol use: Yes    Comment: Occasional   Drug use: No   Sexual activity: Yes    Birth control/protection: Condom  Other Topics  Concern   Not on file  Social History Narrative   Not on file   Social Determinants of Health   Financial Resource Strain: Not on file  Food Insecurity: Not on file  Transportation Needs: Not on file  Physical Activity: Not on file  Stress: Not on file  Social Connections: Not on file  Intimate Partner Violence: Not on file    Family History  Problem Relation Age of Onset   Diabetes Mother    Hypertension Mother    Heart disease Maternal Uncle 56   Hypertension Maternal Grandfather    Miscarriages / Stillbirths Sister    Hypertension Maternal Aunt    Stroke Maternal Grandmother     ROS: no fevers or chills, productive cough, hemoptysis, dysphasia, odynophagia, melena, hematochezia, dysuria,  hematuria, rash, seizure activity, orthopnea, PND, pedal edema, claudication. Remaining systems are negative.  Physical Exam: Well-developed well-nourished in no acute distress.  Skin is warm and dry.  HEENT is normal.  Neck is supple.  Chest is clear to auscultation with normal expansion.  Cardiovascular exam is regular rate and rhythm.  Abdominal exam nontender or distended. No masses palpated. Extremities show no edema. neuro grossly intact  ECG-normal sinus rhythm at a rate of 92, nonspecific ST changes.  Personally reviewed  A/P  1 palpitations-etiology unclear.  She is having a new fluttering sensation that is unlike her previous PVCs.  Note her LV function is normal.  She is extremely concerned about her symptoms.  I will repeat a 3-day Zio patch to see if she can capture (she has tried capturing with her Apple Watch but the symptoms are too short-lived).  I have asked her to take her Toprol 25 mg daily on a running basis.  2 obesity-she is working on weight loss.  Olga Millers, MD

## 2022-01-17 NOTE — Progress Notes (Unsigned)
Enrolled for [employer] to mail a ZIO XT long term holter monitor to the patients address on file.  

## 2022-01-17 NOTE — Patient Instructions (Signed)
Medication Instructions:   TAKE METOPROLOL 25 MG ONCE EVERYDAY  *If you need a refill on your cardiac medications before your next appointment, please call your pharmacy*   Testing/Procedures:  [facility] Monitor Instructions  Your physician has requested you wear a ZIO patch monitor for 3 days.  This is a single patch monitor. [employer] supplies one patch monitor per enrollment. Additional stickers are not available. Please do not apply patch if you will be having a Nuclear Stress Test,  Echocardiogram, Cardiac CT, MRI, or Chest Xray during the period you would be wearing the  monitor. The patch cannot be worn during these tests. You cannot remove and re-apply the  ZIO XT patch monitor.  Your ZIO patch monitor will be mailed 3 day USPS to your address on file. It may take 3-5 days  to receive your monitor after you have been enrolled.  Once you have received your monitor, please review the enclosed instructions. Your monitor  has already been registered assigning a specific monitor serial # to you.  Billing and Patient Assistance Program Information  We have supplied [employer] with any of your insurance information on file for billing purposes. [employer] offers a sliding scale Patient Assistance Program for patients that do not have  insurance, or whose insurance does not completely cover the cost of the ZIO monitor.  You must apply for the Patient Assistance Program to qualify for this discounted rate.  To apply, please call [employer] at [phone], select option 4, select option 2, ask to apply for  Patient Assistance Program. Theodore Demark will ask your household income, and how many people  are in your household. They will quote your out-of-pocket cost based on that information.  [employer] will also be able to set up a 34-month, interest-free payment plan if needed.  Applying the monitor   Shave hair from upper left chest.  Hold abrader disc by orange tab. Rub abrader in 40 strokes  over the upper left chest as  indicated in your monitor instructions.  Clean area with 4 enclosed alcohol pads. Let dry.  Apply patch as indicated in monitor instructions. Patch will be placed under collarbone on left  side of chest with arrow pointing upward.  Rub patch adhesive wings for 2 minutes. Remove white label marked "1". Remove the white  label marked "2". Rub patch adhesive wings for 2 additional minutes.  While looking in a mirror, press and release button in center of patch. A small green light will  flash 3-4 times. This will be your only indicator that the monitor has been turned on.  Do not shower for the first 24 hours. You may shower after the first 24 hours.  Press the button if you feel a symptom. You will hear a small click. Record Date, Time and  Symptom in the Patient Logbook.  When you are ready to remove the patch, follow instructions on the last 2 pages of Patient  Logbook. Stick patch monitor onto the last page of Patient Logbook.  Place Patient Logbook in the blue and white box. Use locking tab on box and tape box closed  securely. The blue and white box has prepaid postage on it. Please place it in the mailbox as  soon as possible. Your physician should have your test results approximately 7 days after the  monitor has been mailed back to [facility].  Call [facility] at [phone] if you have questions regarding  your ZIO XT patch monitor. Call them  immediately if you see an orange light blinking on your  monitor.  If your monitor falls off in less than 4 days, contact our Monitor department at [phone].  If your monitor becomes loose or falls off after 4 days call [employer] at [phone] for  suggestions on securing your monitor    Follow-Up: At [facility], you and your health needs are our priority.  As part of our continuing mission to provide you with exceptional heart care, we have created designated Provider Care  Teams.  These Care Teams include your primary Cardiologist (physician) and Advanced Practice Providers (APPs -  Physician Assistants and Nurse Practitioners) who all work together to provide you with the care you need, when you need it.  We recommend signing up for the patient portal called "MyChart".  Sign up information is provided on this After Visit Summary.  MyChart is used to connect with patients for Virtual Visits (Telemedicine).  Patients are able to view lab/test results, encounter notes, upcoming appointments, etc.  Non-urgent messages can be sent to your provider as well.   To learn more about what you can do with MyChart, go to [email or web address].    Your next appointment:   3 month(s)  The format for your next appointment:   In Person  Provider:   Olga Millers, MD

## 2022-01-19 DIAGNOSIS — M5030 Other cervical disc degeneration, unspecified cervical region: Secondary | ICD-10-CM | POA: Diagnosis not present

## 2022-01-19 DIAGNOSIS — M5412 Radiculopathy, cervical region: Secondary | ICD-10-CM | POA: Diagnosis not present

## 2022-01-22 DIAGNOSIS — R002 Palpitations: Secondary | ICD-10-CM

## 2022-01-26 DIAGNOSIS — M5412 Radiculopathy, cervical region: Secondary | ICD-10-CM | POA: Diagnosis not present

## 2022-01-26 DIAGNOSIS — M5030 Other cervical disc degeneration, unspecified cervical region: Secondary | ICD-10-CM | POA: Diagnosis not present

## 2022-01-27 ENCOUNTER — Encounter: Payer: Self-pay | Admitting: Cardiology

## 2022-01-31 ENCOUNTER — Other Ambulatory Visit: Payer: Self-pay | Admitting: Obstetrics and Gynecology

## 2022-01-31 DIAGNOSIS — Z1231 Encounter for screening mammogram for malignant neoplasm of breast: Secondary | ICD-10-CM

## 2022-02-14 ENCOUNTER — Ambulatory Visit: Payer: Federal, State, Local not specified - PPO | Admitting: Nurse Practitioner

## 2022-02-14 ENCOUNTER — Encounter: Payer: Self-pay | Admitting: Nurse Practitioner

## 2022-02-14 VITALS — BP 126/84 | HR 87 | Temp 97.8°F | Ht 61.0 in | Wt 219.6 lb

## 2022-02-14 DIAGNOSIS — Z6841 Body Mass Index (BMI) 40.0 and over, adult: Secondary | ICD-10-CM

## 2022-02-14 DIAGNOSIS — R002 Palpitations: Secondary | ICD-10-CM

## 2022-02-14 DIAGNOSIS — R739 Hyperglycemia, unspecified: Secondary | ICD-10-CM

## 2022-02-14 LAB — POCT GLYCOSYLATED HEMOGLOBIN (HGB A1C): Hemoglobin A1C: 5.5 % (ref 4.0–5.6)

## 2022-02-14 MED ORDER — OZEMPIC (0.25 OR 0.5 MG/DOSE) 2 MG/1.5ML ~~LOC~~ SOPN: PEN_INJECTOR | SUBCUTANEOUS | 1 refills | Status: DC

## 2022-02-14 NOTE — Assessment & Plan Note (Addendum)
Continues to struggle with weight loss despite daily exercise (walking 50mins) and heart healthy diet. She wants to avoid phentermine and qsymia due to hx of anxiety/depression and intermittent palpitation. Previous use of contrave with no improvement. Hx of pure hypercholesteremia and hyperglycemia We discussed use of GLP-1 injection and possible side effects. She choose to start ozempic vs wegovy due to insurance coverage and drug shortage. Advised about need for PA to be completed. Wt Readings from Last 3 Encounters:  02/14/22 219 lb 9.6 oz (99.6 kg)  01/17/22 215 lb (97.5 kg)  12/28/21 214 lb 12.8 oz (97.4 kg)   Repeat hgbA1c at 5.5%: normal Ozempic rx sent  Will sent Saxenda injection is ozempic PA is not approved. F/up in 6weeks

## 2022-02-14 NOTE — Progress Notes (Signed)
Established Patient Visit  Patient: Kiara Hall   DOB: 1980/09/02   41 y.o. Female  MRN: [id number] Visit Date: 02/14/2022  Subjective:    Chief Complaint  Patient presents with   Office Visit    Weight management f/u Pt has stopped breastfeeding and would like to start a medication to jump start weight loss    HPI Palpitation holter monitor: normal 12/2021 Unable to tolerate metoprolol (made palpitation worse) Decreased episodes of  Palpitations since discontinuation of breastfeeding  Morbid obesity ([place]) Continues to struggle with weight loss despite daily exercise (walking 23mins) and heart healthy diet. She wants to avoid phentermine and qsymia due to hx of anxiety/depression and intermittent palpitation. Previous use of contrave with no improvement. Hx of pure hypercholesteremia and hyperglycemia We discussed use of GLP-1 injection and possible side effects. She choose to start ozempic vs wegovy due to insurance coverage and drug shortage. Advised about need for PA to be completed. Wt Readings from Last 3 Encounters:  02/14/22 219 lb 9.6 oz (99.6 kg)  01/17/22 215 lb (97.5 kg)  12/28/21 214 lb 12.8 oz (97.4 kg)   Repeat hgbA1c at 5.5%: normal Ozempic rx sent  Will sent Saxenda injection is ozempic PA is not approved. F/up in 6weeks  Reviewed medical, surgical, and social history today  Medications: Outpatient Medications Prior to Visit  Medication Sig   acetaminophen (TYLENOL) 325 MG tablet Take 2 tablets (650 mg total) by mouth every 4 (four) hours as needed for mild pain (temperature > 101.5.).   azelastine (ASTELIN) 0.1 % nasal spray Place 1 spray into both nostrils 2 (two) times daily. Use in each nostril as directed   ibuprofen (ADVIL) 600 MG tablet Take 1 tablet (600 mg total) by mouth every 6 (six) hours as needed.   loratadine (CLARITIN) 10 MG tablet Take 10 mg by mouth daily as needed for allergies.   hydrOXYzine (ATARAX) 10 MG tablet Take 1  tablet (10 mg total) by mouth every 8 (eight) hours as needed. (Patient not taking: Reported on 02/14/2022)   [DISCONTINUED] fluticasone (FLONASE) 50 MCG/ACT nasal spray Place 1 spray into both nostrils daily as needed for allergies or rhinitis. (Patient not taking: Reported on 02/14/2022)   [DISCONTINUED] gabapentin (NEURONTIN) 100 MG capsule Take 1 capsule (100 mg total) by mouth at bedtime. (Patient not taking: Reported on 02/14/2022)   [DISCONTINUED] metoprolol succinate (TOPROL XL) 25 MG 24 hr tablet Take 1 tablet (25 mg total) by mouth daily. Take one tablet once daily at bedtime as needed (Patient not taking: Reported on 02/14/2022)   No facility-administered medications prior to visit.   Reviewed past medical and social history.   ROS per HPI above  Last CBC Lab Results  Component Value Date   WBC 6.4 10/27/2021   HGB 12.7 10/27/2021   HCT 40.2 10/27/2021   MCV 78.2 (L) 10/27/2021   MCH 24.7 (L) 10/27/2021   RDW 17.3 (H) 10/27/2021   PLT 234 78/24/2353   Last metabolic panel Lab Results  Component Value Date   GLUCOSE 96 10/27/2021   NA 138 10/27/2021   K 3.6 10/27/2021   CL 102 10/27/2021   CO2 24 10/27/2021   BUN 10 10/27/2021   CREATININE 0.60 10/27/2021   GFRNONAA >60 10/27/2021   CALCIUM 9.7 10/27/2021   PROT 6.1 (L) 04/20/2021   ALBUMIN 2.7 (L) 04/20/2021   BILITOT 1.1 04/20/2021   ALKPHOS 83 04/20/2021  AST 21 04/20/2021   ALT 13 04/20/2021   ANIONGAP 12 10/27/2021   Last hemoglobin A1c Lab Results  Component Value Date   HGBA1C 5.7 06/10/2020   Last thyroid functions Lab Results  Component Value Date   TSH 1.11 08/20/2021   T3TOTAL 139.9 11/06/2007      Objective:  BP 126/84 (BP Location: Right Arm, Patient Position: Sitting, Cuff Size: Normal)   Pulse 87   Temp 97.8 F (36.6 C) (Temporal)   Ht 5\' 1"  (1.549 m)   Wt 219 lb 9.6 oz (99.6 kg)   LMP 02/02/2022 (Exact Date) Comment: s/p salphinectomy  SpO2 98%   Breastfeeding No   BMI 41.49  kg/m      Physical Exam Vitals reviewed.  Cardiovascular:     Rate and Rhythm: Normal rate.     Pulses: Normal pulses.  Pulmonary:     Effort: Pulmonary effort is normal.  Neurological:     Mental Status: She is alert and oriented to person, place, and time.  Psychiatric:        Mood and Affect: Mood normal.        Behavior: Behavior normal.        Thought Content: Thought content normal.     No results found for any visits on 02/14/22.    Assessment & Plan:    Problem List Items Addressed This Visit       Other   Morbid obesity ([facility]) - Primary    Continues to struggle with weight loss despite daily exercise (walking 02/16/22) and heart healthy diet. She wants to avoid phentermine and qsymia due to hx of anxiety/depression and intermittent palpitation. Previous use of contrave with no improvement. Hx of pure hypercholesteremia and hyperglycemia We discussed use of GLP-1 injection and possible side effects. She choose to start ozempic vs wegovy due to insurance coverage and drug shortage. Advised about need for PA to be completed. Wt Readings from Last 3 Encounters:  02/14/22 219 lb 9.6 oz (99.6 kg)  01/17/22 215 lb (97.5 kg)  12/28/21 214 lb 12.8 oz (97.4 kg)   Repeat hgbA1c at 5.5%: normal Ozempic rx sent  Will sent Saxenda injection is ozempic PA is not approved. F/up in 6weeks      Relevant Medications   Semaglutide,0.25 or 0.5MG /DOS, (OZEMPIC, 0.25 OR 0.5 MG/DOSE,) 2 MG/1.5ML SOPN   Palpitation    holter monitor: normal 12/2021 Unable to tolerate metoprolol (made palpitation worse) Decreased episodes of  Palpitations since discontinuation of breastfeeding      Other Visit Diagnoses     Hyperglycemia       Relevant Orders   POCT glycosylated hemoglobin (Hb A1C)      Return in about 6 weeks (around 03/28/2022) for Weight management.     03/30/2022, NP

## 2022-02-14 NOTE — Patient Instructions (Addendum)
hgbA1c of 5.5%: normal Start ozempic once PA is approved Continue heart healthy diet and daily exercise.  Calorie Counting for Weight Loss Calories are units of energy. Your body needs a certain number of calories from food to keep going throughout the day. When you eat or drink more calories than your body needs, your body stores the extra calories mostly as fat. When you eat or drink fewer calories than your body needs, your body burns fat to get the energy it needs. Calorie counting means keeping track of how many calories you eat and drink each day. Calorie counting can be helpful if you need to lose weight. If you eat fewer calories than your body needs, you should lose weight. Ask your health care provider what a healthy weight is for you. For calorie counting to work, you will need to eat the right number of calories each day to lose a healthy amount of weight per week. A dietitian can help you figure out how many calories you need in a day and will suggest ways to reach your calorie goal. A healthy amount of weight to lose each week is usually 1-2 lb (0.5-0.9 kg). This usually means that your daily calorie intake should be reduced by 500-750 calories. Eating 1,200-1,500 calories a day can help most women lose weight. Eating 1,500-1,800 calories a day can help most men lose weight. What do I need to know about calorie counting? Work with your health care provider or dietitian to determine how many calories you should get each day. To meet your daily calorie goal, you will need to: Find out how many calories are in each food that you would like to eat. Try to do this before you eat. Decide how much of the food you plan to eat. Keep a food log. Do this by writing down what you ate and how many calories it had. To successfully lose weight, it is important to balance calorie counting with a healthy lifestyle that includes regular activity. Where do I find calorie information?  The number of  calories in a food can be found on a Nutrition Facts label. If a food does not have a Nutrition Facts label, try to look up the calories online or ask your dietitian for help. Remember that calories are listed per serving. If you choose to have more than one serving of a food, you will have to multiply the calories per serving by the number of servings you plan to eat. For example, the label on a package of bread might say that a serving size is 1 slice and that there are 90 calories in a serving. If you eat 1 slice, you will have eaten 90 calories. If you eat 2 slices, you will have eaten 180 calories. How do I keep a food log? After each time that you eat, record the following in your food log as soon as possible: What you ate. Be sure to include toppings, sauces, and other extras on the food. How much you ate. This can be measured in cups, ounces, or number of items. How many calories were in each food and drink. The total number of calories in the food you ate. Keep your food log near you, such as in a pocket-sized notebook or on an app or website on your mobile phone. Some programs will calculate calories for you and show you how many calories you have left to meet your daily goal. What are some portion-control tips? Know how many calories  are in a serving. This will help you know how many servings you can have of a certain food. Use a measuring cup to measure serving sizes. You could also try weighing out portions on a kitchen scale. With time, you will be able to estimate serving sizes for some foods. Take time to put servings of different foods on your favorite plates or in your favorite bowls and cups so you know what a serving looks like. Try not to eat straight from a food's packaging, such as from a bag or box. Eating straight from the package makes it hard to see how much you are eating and can lead to overeating. Put the amount you would like to eat in a cup or on a plate to make sure you  are eating the right portion. Use smaller plates, glasses, and bowls for smaller portions and to prevent overeating. Try not to multitask. For example, avoid watching TV or using your computer while eating. If it is time to eat, sit down at a table and enjoy your food. This will help you recognize when you are full. It will also help you be more mindful of what and how much you are eating. What are tips for following this plan? Reading food labels Check the calorie count compared with the serving size. The serving size may be smaller than what you are used to eating. Check the source of the calories. Try to choose foods that are high in protein, fiber, and vitamins, and low in saturated fat, trans fat, and sodium. Shopping Read nutrition labels while you shop. This will help you make healthy decisions about which foods to buy. Pay attention to nutrition labels for low-fat or fat-free foods. These foods sometimes have the same number of calories or more calories than the full-fat versions. They also often have added sugar, starch, or salt to make up for flavor that was removed with the fat. Make a grocery list of lower-calorie foods and stick to it. Cooking Try to cook your favorite foods in a healthier way. For example, try baking instead of frying. Use low-fat dairy products. Meal planning Use more fruits and vegetables. One-half of your plate should be fruits and vegetables. Include lean proteins, such as chicken, [place], and fish. Lifestyle Each week, aim to do one of the following: 150 minutes of moderate exercise, such as walking. 75 minutes of vigorous exercise, such as running. General information Know how many calories are in the foods you eat most often. This will help you calculate calorie counts faster. Find a way of tracking calories that works for you. Get creative. Try different apps or programs if writing down calories does not work for you. What foods should I eat?  Eat  nutritious foods. It is better to have a nutritious, high-calorie food, such as an avocado, than a food with few nutrients, such as a bag of potato chips. Use your calories on foods and drinks that will fill you up and will not leave you hungry soon after eating. Examples of foods that fill you up are nuts and nut butters, vegetables, lean proteins, and high-fiber foods such as whole grains. High-fiber foods are foods with more than 5 g of fiber per serving. Pay attention to calories in drinks. Low-calorie drinks include water and unsweetened drinks. The items listed above may not be a complete list of foods and beverages you can eat. Contact a dietitian for more information. What foods should I limit? Limit foods or drinks  that are not good sources of vitamins, minerals, or protein or that are high in unhealthy fats. These include: Candy. Other sweets. Sodas, specialty coffee drinks, alcohol, and juice. The items listed above may not be a complete list of foods and beverages you should avoid. Contact a dietitian for more information. How do I count calories when eating out? Pay attention to portions. Often, portions are much larger when eating out. Try these tips to keep portions smaller: Consider sharing a meal instead of getting your own. If you get your own meal, eat only half of it. Before you start eating, ask for a container and put half of your meal into it. When available, consider ordering smaller portions from the menu instead of full portions. Pay attention to your food and drink choices. Knowing the way food is cooked and what is included with the meal can help you eat fewer calories. If calories are listed on the menu, choose the lower-calorie options. Choose dishes that include vegetables, fruits, whole grains, low-fat dairy products, and lean proteins. Choose items that are boiled, broiled, grilled, or steamed. Avoid items that are buttered, battered, fried, or served with cream  sauce. Items labeled as crispy are usually fried, unless stated otherwise. Choose water, low-fat milk, unsweetened iced tea, or other drinks without added sugar. If you want an alcoholic beverage, choose a lower-calorie option, such as a glass of wine or light beer. Ask for dressings, sauces, and syrups on the side. These are usually high in calories, so you should limit the amount you eat. If you want a salad, choose a garden salad and ask for grilled meats. Avoid extra toppings such as bacon, cheese, or fried items. Ask for the dressing on the side, or ask for olive oil and vinegar or lemon to use as dressing. Estimate how many servings of a food you are given. Knowing serving sizes will help you be aware of how much food you are eating at restaurants. Where to find more information Centers for Disease Control and Prevention: FootballExhibition.com.br U.[place]. Department of Agriculture: WrestlingReporter.dk Summary Calorie counting means keeping track of how many calories you eat and drink each day. If you eat fewer calories than your body needs, you should lose weight. A healthy amount of weight to lose per week is usually 1-2 lb (0.5-0.9 kg). This usually means reducing your daily calorie intake by 500-750 calories. The number of calories in a food can be found on a Nutrition Facts label. If a food does not have a Nutrition Facts label, try to look up the calories online or ask your dietitian for help. Use smaller plates, glasses, and bowls for smaller portions and to prevent overeating. Use your calories on foods and drinks that will fill you up and not leave you hungry shortly after a meal. This information is not intended to replace advice given to you by your health care provider. Make sure you discuss any questions you have with your health care provider. Document Revised: 05/30/2019 Document Reviewed: 05/30/2019 Elsevier Patient Education  2023 [employer].

## 2022-02-14 NOTE — Assessment & Plan Note (Addendum)
holter monitor: normal 12/2021 Unable to tolerate metoprolol (made palpitation worse) Decreased episodes of  Palpitations since discontinuation of breastfeeding

## 2022-02-16 ENCOUNTER — Encounter: Payer: Self-pay | Admitting: Nurse Practitioner

## 2022-02-17 MED ORDER — SAXENDA 18 MG/3ML ~~LOC~~ SOPN: PEN_INJECTOR | SUBCUTANEOUS | 0 refills | Status: DC

## 2022-02-17 NOTE — Telephone Encounter (Signed)
PA denied for Ozempic

## 2022-02-22 ENCOUNTER — Other Ambulatory Visit (HOSPITAL_BASED_OUTPATIENT_CLINIC_OR_DEPARTMENT_OTHER): Payer: Self-pay

## 2022-02-22 MED ORDER — SAXENDA 18 MG/3ML ~~LOC~~ SOPN
PEN_INJECTOR | SUBCUTANEOUS | 0 refills | Status: DC
  Filled 2022-02-22 – 2022-02-23 (×2): qty 9, 30d supply, fill #0

## 2022-02-22 NOTE — Addendum Note (Signed)
Addended by: Wilfred Lacy L on: 02/22/2022 01:11 PM   Modules accepted: Orders

## 2022-02-23 ENCOUNTER — Other Ambulatory Visit (HOSPITAL_COMMUNITY): Payer: Self-pay

## 2022-02-23 ENCOUNTER — Other Ambulatory Visit (HOSPITAL_BASED_OUTPATIENT_CLINIC_OR_DEPARTMENT_OTHER): Payer: Self-pay

## 2022-02-23 ENCOUNTER — Other Ambulatory Visit: Payer: Self-pay | Admitting: Nurse Practitioner

## 2022-02-23 MED ORDER — TECHLITE PEN NEEDLES 32G X 4 MM MISC
0 refills | Status: DC
  Filled 2022-02-23: qty 100, 90d supply, fill #0

## 2022-03-02 ENCOUNTER — Ambulatory Visit
Admission: RE | Admit: 2022-03-02 | Discharge: 2022-03-02 | Disposition: A | Discharge status: Home / Self Care | Payer: Federal, State, Local not specified - PPO | Source: Ambulatory Visit | Attending: Obstetrics and Gynecology | Admitting: Obstetrics and Gynecology

## 2022-03-02 DIAGNOSIS — Z1231 Encounter for screening mammogram for malignant neoplasm of breast: Secondary | ICD-10-CM

## 2022-03-22 DIAGNOSIS — I493 Ventricular premature depolarization: Secondary | ICD-10-CM | POA: Diagnosis not present

## 2022-03-22 DIAGNOSIS — I491 Atrial premature depolarization: Secondary | ICD-10-CM | POA: Diagnosis not present

## 2022-03-22 DIAGNOSIS — I4729 Other ventricular tachycardia: Secondary | ICD-10-CM | POA: Diagnosis not present

## 2022-03-22 DIAGNOSIS — R0789 Other chest pain: Secondary | ICD-10-CM | POA: Diagnosis not present

## 2022-03-29 ENCOUNTER — Encounter: Payer: Self-pay | Admitting: Nurse Practitioner

## 2022-03-29 ENCOUNTER — Other Ambulatory Visit (HOSPITAL_BASED_OUTPATIENT_CLINIC_OR_DEPARTMENT_OTHER): Payer: Self-pay

## 2022-03-29 ENCOUNTER — Other Ambulatory Visit: Payer: Self-pay | Admitting: Nurse Practitioner

## 2022-03-29 ENCOUNTER — Other Ambulatory Visit: Payer: Self-pay

## 2022-03-29 ENCOUNTER — Ambulatory Visit: Payer: Federal, State, Local not specified - PPO | Admitting: Nurse Practitioner

## 2022-03-29 VITALS — BP 138/82 | HR 85 | Temp 97.3°F | Ht 61.0 in | Wt 214.4 lb

## 2022-03-29 DIAGNOSIS — K5901 Slow transit constipation: Secondary | ICD-10-CM

## 2022-03-29 DIAGNOSIS — K219 Gastro-esophageal reflux disease without esophagitis: Secondary | ICD-10-CM

## 2022-03-29 DIAGNOSIS — Z6841 Body Mass Index (BMI) 40.0 and over, adult: Secondary | ICD-10-CM

## 2022-03-29 DIAGNOSIS — F411 Generalized anxiety disorder: Secondary | ICD-10-CM | POA: Diagnosis not present

## 2022-03-29 MED ORDER — SAXENDA 18 MG/3ML ~~LOC~~ SOPN
2.4000 mg | PEN_INJECTOR | Freq: Every day | SUBCUTANEOUS | 0 refills | Status: DC
  Filled 2022-03-29: qty 9, 22d supply, fill #0

## 2022-03-29 MED ORDER — POLYETHYLENE GLYCOL 3350 17 G PO PACK: 17.0000 g | PACK | ORAL | 0 refills | Status: DC

## 2022-03-29 MED ORDER — PANTOPRAZOLE SODIUM 20 MG PO TBEC: 20.0000 mg | DELAYED_RELEASE_TABLET | Freq: Two times a day (BID) | ORAL | 0 refills | Status: DC

## 2022-03-29 MED ORDER — SAXENDA 18 MG/3ML ~~LOC~~ SOPN
2.4000 mg | PEN_INJECTOR | Freq: Every day | SUBCUTANEOUS | 0 refills | Status: DC
Start: 2022-03-29 — End: 2022-03-29

## 2022-03-29 MED ORDER — ALPRAZOLAM 0.25 MG PO TABS: 0.2500 mg | ORAL_TABLET | Freq: Every day | ORAL | 0 refills | Status: DC | PRN

## 2022-03-29 NOTE — Assessment & Plan Note (Signed)
Acute on chronic with use of saxenda Advised to use miralax OTC, adequate oral hydration and high fiber diet

## 2022-03-29 NOTE — Assessment & Plan Note (Signed)
Chronic, worries about her health and potential medication side effects. Unable to tolerate zoloft and buspar in the past. Vistaril caused daytime somnolence We discussed use of cynbalta or lexapro. She declined and opted for alprazolam prn.  Provided a short supply of aplprazolam 0.25mg , but advised that this prescription will not be refilled. She verbalized understanding. Also advised to maintain appt with therapist.

## 2022-03-29 NOTE — Progress Notes (Signed)
Established Patient Visit  Patient: Kiara Hall   DOB: Sep 12, 1980   41 y.o. Female  MRN: [id number] Visit Date: 03/29/2022  Subjective:    Chief Complaint  Patient presents with   Office Visit    Weight management  Says things have been going well but some side effects bother her, stomach issues, GERD, constipation  Anxiety    HPI Gastroesophageal reflux disease Burning sensation in epigastric region Previous medication used: Prilosec 20mg  daily x 2weeks, no relief, prevacid OTC with no relief as well, reports some Relief with nexium and gaviscon for a short period, then symptoms returned.  Start pantoprazole 20mg  BID x 30days, then stop Ref to gi and get ABD [place] if no improvement   Slow transit constipation Acute on chronic with use of saxenda Advised to use miralax OTC, adequate oral hydration and high fiber diet  Morbid obesity (HCC) Lost 5lbs with saxenda, current dose at 2.4mg  daily Reports GERD and constipation. See above for recommendations She has maintain exercise regimen, heart healthy diet and small meal portion Wt Readings from Last 3 Encounters:  03/29/22 214 lb 6.4 oz (97.3 kg)  02/14/22 219 lb 9.6 oz (99.6 kg)  01/17/22 215 lb (97.5 kg)    Maintain saxenda dose at 2.4mg  F/up in 62month  GAD (generalized anxiety disorder) Chronic, worries about her health and potential medication side effects. Unable to tolerate zoloft and buspar in the past. Vistaril caused daytime somnolence We discussed use of cynbalta or lexapro. She declined and opted for alprazolam prn.  Provided a short supply of aplprazolam 0.25mg , but advised that this prescription will not be refilled. She verbalized understanding. Also advised to maintain appt with therapist.    Reviewed medical, surgical, and social history today  Medications: Outpatient Medications Prior to Visit  Medication Sig   acetaminophen (TYLENOL) 325 MG tablet Take 2 tablets (650 mg total) by  mouth every 4 (four) hours as needed for mild pain (temperature > 101.5.).   azelastine (ASTELIN) 0.1 % nasal spray Place 1 spray into both nostrils 2 (two) times daily. Use in each nostril as directed   diltiazem (CARDIZEM CD) 120 MG 24 hr capsule Take 1 capsule (120 mg total) by mouth daily.   hydrOXYzine (ATARAX) 10 MG tablet Take 1 tablet (10 mg total) by mouth every 8 (eight) hours as needed.   Insulin Pen Needle (TECHLITE PEN NEEDLES) 32G X 4 MM MISC Use with saxenda   loratadine (CLARITIN) 10 MG tablet Take 10 mg by mouth daily as needed for allergies.   [DISCONTINUED] ibuprofen (ADVIL) 600 MG tablet Take 1 tablet (600 mg total) by mouth every 6 (six) hours as needed.   [DISCONTINUED] Liraglutide -Weight Management (SAXENDA) 18 MG/3ML SOPN Inject 0.6 mg into the skin daily at 12 noon for 7 days, THEN 1.2 mg daily at 12 noon for 7 days, THEN 1.8 mg daily at 12 noon for 7 days, THEN 2.4 mg daily at 12 noon for 7 days, THEN 3 mg daily at 12 noon for 7 days.   No facility-administered medications prior to visit.   Reviewed past medical and social history.   ROS per HPI above      Objective:  BP 138/82 (BP Location: Right Arm, Patient Position: Sitting, Cuff Size: Normal)   Pulse 85   Temp (!) 97.3 F (36.3 C) (Temporal)   Ht 5\' 1"  (1.549 m)   Wt 214 lb 6.4  oz (97.3 kg)   LMP 02/24/2022 (Exact Date) Comment: s/p salphinectomy  SpO2 98%   BMI 40.51 kg/m      Physical Exam Cardiovascular:     Rate and Rhythm: Normal rate.     Pulses: Normal pulses.  Pulmonary:     Effort: Pulmonary effort is normal.  Neurological:     Mental Status: She is alert and oriented to person, place, and time.  Psychiatric:        Mood and Affect: Mood is anxious.        Behavior: Behavior normal.        Thought Content: Thought content normal.     No results found for any visits on 03/29/22.    Assessment & Plan:    Problem List Items Addressed This Visit       Digestive    Gastroesophageal reflux disease - Primary    Burning sensation in epigastric region Previous medication used: Prilosec 20mg  daily x 2weeks, no relief, prevacid OTC with no relief as well, reports some Relief with nexium and gaviscon for a short period, then symptoms returned.  Start pantoprazole 20mg  BID x 30days, then stop Ref to gi and get ABD if no improvement       Relevant Medications   pantoprazole (PROTONIX) 20 MG tablet   polyethylene glycol (MIRALAX / GLYCOLAX) 17 g packet   Slow transit constipation    Acute on chronic with use of saxenda Advised to use miralax OTC, adequate oral hydration and high fiber diet      Relevant Medications   polyethylene glycol (MIRALAX / GLYCOLAX) 17 g packet     Other   GAD (generalized anxiety disorder)    Chronic, worries about her health and potential medication side effects. Unable to tolerate zoloft and buspar in the past. Vistaril caused daytime somnolence We discussed use of cynbalta or lexapro. She declined and opted for alprazolam prn.  Provided a short supply of aplprazolam 0.25mg , but advised that this prescription will not be refilled. She verbalized understanding. Also advised to maintain appt with therapist.      Relevant Medications   ALPRAZolam (XANAX) 0.25 MG tablet   Morbid obesity (HCC)    Lost 5lbs with saxenda, current dose at 2.4mg  daily Reports GERD and constipation. See above for recommendations She has maintain exercise regimen, heart healthy diet and small meal portion Wt Readings from Last 3 Encounters:  03/29/22 214 lb 6.4 oz (97.3 kg)  02/14/22 219 lb 9.6 oz (99.6 kg)  01/17/22 215 lb (97.5 kg)    Maintain saxenda dose at 2.4mg  F/up in 67month      Other Visit Diagnoses     Class 3 severe obesity due to excess calories with serious comorbidity and body mass index (BMI) of 40.0 to 44.9 in adult [facility])          Return in about 4 weeks (around 04/26/2022) for Weight management, depression and  anxiety.     [facility], NP

## 2022-03-29 NOTE — Assessment & Plan Note (Addendum)
Burning sensation in epigastric region Previous medication used: Prilosec 20mg  daily x 2weeks, no relief, prevacid OTC with no relief as well, reports some Relief with nexium and gaviscon for a short period, then symptoms returned.  Start pantoprazole 20mg  BID x 30days, then stop Ref to gi and get ABD if no improvement

## 2022-03-29 NOTE — Assessment & Plan Note (Signed)
Lost 5lbs with saxenda, current dose at 2.4mg  daily Reports GERD and constipation. See above for recommendations She has maintain exercise regimen, heart healthy diet and small meal portion Wt Readings from Last 3 Encounters:  03/29/22 214 lb 6.4 oz (97.3 kg)  02/14/22 219 lb 9.6 oz (99.6 kg)  01/17/22 215 lb (97.5 kg)    Maintain saxenda dose at 2.4mg  F/up in 69month

## 2022-03-29 NOTE — Patient Instructions (Signed)
Cymbalta or lexapro Maintain saxenda at 2.4mg 

## 2022-03-31 ENCOUNTER — Other Ambulatory Visit (HOSPITAL_BASED_OUTPATIENT_CLINIC_OR_DEPARTMENT_OTHER): Payer: Self-pay

## 2022-04-07 NOTE — Progress Notes (Deleted)
HPI: FU palpitations.  Echocardiogram March 2023 showed normal LV function, basal lateral hypokinesis, mild left ventricular enlargement.  Monitor March 2023 showed normal sinus rhythm with 4 beats nonsustained ventricular tachycardia and PVCs.  Her symptoms are felt secondary to PVCs.  Monitor repeated September 2023 and showed sinus rhythm with rare PAC and PVC.  Patient seen at [facility] November 2023 and exercise treadmill recommended.  Since last seen   Current Outpatient Medications  Medication Sig Dispense Refill   acetaminophen (TYLENOL) 325 MG tablet Take 2 tablets (650 mg total) by mouth every 4 (four) hours as needed for mild pain (temperature > 101.5.).     ALPRAZolam (XANAX) 0.25 MG tablet Take 1 tablet (0.25 mg total) by mouth daily as needed for anxiety. 7 tablet 0   azelastine (ASTELIN) 0.1 % nasal spray Place 1 spray into both nostrils 2 (two) times daily. Use in each nostril as directed 30 mL 5   diltiazem (CARDIZEM CD) 120 MG 24 hr capsule Take 1 capsule (120 mg total) by mouth daily. 60 capsule 5   hydrOXYzine (ATARAX) 10 MG tablet Take 1 tablet (10 mg total) by mouth every 8 (eight) hours as needed. 30 tablet 1   Insulin Pen Needle (TECHLITE PEN NEEDLES) 32G X 4 MM MISC Use with saxenda 100 each 0   Liraglutide -Weight Management (SAXENDA) 18 MG/3ML SOPN Inject 2.4 mg into the skin daily at 12 noon. 9 mL 0   loratadine (CLARITIN) 10 MG tablet Take 10 mg by mouth daily as needed for allergies.     pantoprazole (PROTONIX) 20 MG tablet Take 1 tablet (20 mg total) by mouth 2 (two) times daily before a meal. 60 tablet 0   polyethylene glycol (MIRALAX / GLYCOLAX) 17 g packet Take 17 g by mouth every other day.  0   No current facility-administered medications for this visit.     Past Medical History:  Diagnosis Date   Anemia    Anxiety    Chicken pox    Complication of anesthesia    hypotension with one of the spinal   ELEVATED BP READING WITHOUT DX HYPERTENSION  12/17/2007   Qualifier: Diagnosis of  By: Andrey Campanile MD, Raliegh Ip     GERD (gastroesophageal reflux disease)    Palpitation     Past Surgical History:  Procedure Laterality Date   BREAST BIOPSY Left    benign    CESAREAN SECTION N/A 02/09/2015   Procedure: CESAREAN SECTION;  Surgeon: Gerald Leitz, MD;  Location: [facility];  Service: Obstetrics;  Laterality: N/A;   CESAREAN SECTION N/A 02/05/2019   Procedure: CESAREAN SECTION;  Surgeon: Gerald Leitz, MD;  Location: [facility] LD ORS;  Service: Obstetrics;  Laterality: N/A;   CESAREAN SECTION Bilateral 04/22/2021   Procedure: REPEAT CESAREAN SECTION WITH BILATERAL SALPINGECTOMY;  Surgeon: Gerald Leitz, MD;  Location: [facility] LD ORS;  Service: Obstetrics;  Laterality: Bilateral;   TONSILLECTOMY      Social History   Socioeconomic History   Marital status: Married    Spouse name: Not on file   Number of children: 3   Years of education: Not on file   Highest education level: Not on file  Occupational History   Not on file  Tobacco Use   Smoking status: Never   Smokeless tobacco: Never  Vaping Use   Vaping Use: Never used  Substance and Sexual Activity   Alcohol use: Yes    Comment: Occasional   Drug use: No   Sexual activity:  Yes    Birth control/protection: Condom  Other Topics Concern   Not on file  Social History Narrative   Not on file   Social Determinants of Health   Financial Resource Strain: Not on file  Food Insecurity: Not on file  Transportation Needs: Not on file  Physical Activity: Not on file  Stress: Not on file  Social Connections: Not on file  Intimate Partner Violence: Not on file    Family History  Problem Relation Age of Onset   Diabetes Mother    Hypertension Mother    Heart disease Maternal Uncle 15   Hypertension Maternal Grandfather    Miscarriages / Stillbirths Sister    Hypertension Maternal Aunt    Stroke Maternal Grandmother     ROS: no fevers or chills, productive cough, hemoptysis, dysphasia,  odynophagia, melena, hematochezia, dysuria, hematuria, rash, seizure activity, orthopnea, PND, pedal edema, claudication. Remaining systems are negative.  Physical Exam: Well-developed well-nourished in no acute distress.  Skin is warm and dry.  HEENT is normal.  Neck is supple.  Chest is clear to auscultation with normal expansion.  Cardiovascular exam is regular rate and rhythm.  Abdominal exam nontender or distended. No masses palpated. Extremities show no edema. neuro grossly intact  ECG- personally reviewed  A/P  1 palpitations-follow-up monitor showed PACs and PVCs.  As outlined in previous note her LV function is normal.  Continue Toprol.  2 obesity-she continues to work on her weight.  Olga Millers, MD

## 2022-04-15 ENCOUNTER — Encounter: Payer: Self-pay | Admitting: Cardiology

## 2022-04-18 ENCOUNTER — Ambulatory Visit: Payer: Federal, State, Local not specified - PPO | Admitting: Cardiology

## 2022-05-05 ENCOUNTER — Encounter: Payer: Self-pay | Admitting: Nurse Practitioner

## 2022-05-05 ENCOUNTER — Ambulatory Visit: Payer: Federal, State, Local not specified - PPO | Admitting: Nurse Practitioner

## 2022-05-05 VITALS — BP 136/90 | HR 95 | Temp 97.7°F | Ht 61.0 in | Wt 219.2 lb

## 2022-05-05 DIAGNOSIS — R5381 Other malaise: Secondary | ICD-10-CM | POA: Diagnosis not present

## 2022-05-05 DIAGNOSIS — E559 Vitamin D deficiency, unspecified: Secondary | ICD-10-CM | POA: Diagnosis not present

## 2022-05-05 DIAGNOSIS — M5412 Radiculopathy, cervical region: Secondary | ICD-10-CM

## 2022-05-05 DIAGNOSIS — K219 Gastro-esophageal reflux disease without esophagitis: Secondary | ICD-10-CM

## 2022-05-05 DIAGNOSIS — R1012 Left upper quadrant pain: Secondary | ICD-10-CM

## 2022-05-05 DIAGNOSIS — D500 Iron deficiency anemia secondary to blood loss (chronic): Secondary | ICD-10-CM

## 2022-05-05 DIAGNOSIS — F411 Generalized anxiety disorder: Secondary | ICD-10-CM

## 2022-05-05 LAB — COMPREHENSIVE METABOLIC PANEL
ALT: 10 U/L (ref 0–35)
AST: 14 U/L (ref 0–37)
Albumin: 4.3 g/dL (ref 3.5–5.2)
Alkaline Phosphatase: 59 U/L (ref 39–117)
BUN: 8 mg/dL (ref 6–23)
CO2: 30 mEq/L (ref 19–32)
Calcium: 9.3 mg/dL (ref 8.4–10.5)
Chloride: 102 mEq/L (ref 96–112)
Creatinine, Ser: 0.61 mg/dL (ref 0.40–1.20)
GFR: 111.45 mL/min (ref >60.00)
Glucose, Bld: 100 mg/dL — ABNORMAL HIGH (ref 70–99)
Potassium: 4.3 mEq/L (ref 3.5–5.1)
Sodium: 138 mEq/L (ref 135–145)
Total Bilirubin: 0.5 mg/dL (ref 0.2–1.2)
Total Protein: 7.2 g/dL (ref 6.0–8.3)

## 2022-05-05 LAB — CBC WITH DIFFERENTIAL/PLATELET
Basophils Absolute: 0 10*3/uL (ref 0.0–0.1)
Basophils Relative: 0.3 % (ref 0.0–3.0)
Eosinophils Absolute: 0.1 10*3/uL (ref 0.0–0.7)
Eosinophils Relative: 1.7 % (ref 0.0–5.0)
HCT: 31.3 % — ABNORMAL LOW (ref 36.0–46.0)
Hemoglobin: 10 g/dL — ABNORMAL LOW (ref 12.0–15.0)
Lymphocytes Relative: 29 % (ref 12.0–46.0)
Lymphs Abs: 1.3 10*3/uL (ref 0.7–4.0)
MCHC: 31.9 g/dL (ref 30.0–36.0)
MCV: 70.6 fl — ABNORMAL LOW (ref 78.0–100.0)
Monocytes Absolute: 0.4 10*3/uL (ref 0.1–1.0)
Monocytes Relative: 9.1 % (ref 3.0–12.0)
Neutro Abs: 2.7 10*3/uL (ref 1.4–7.7)
Neutrophils Relative %: 59.9 % (ref 43.0–77.0)
Platelets: 306 10*3/uL (ref 150.0–400.0)
RBC: 4.43 Mil/uL (ref 3.87–5.11)
RDW: 18.3 % — ABNORMAL HIGH (ref 11.5–15.5)
WBC: 4.6 10*3/uL (ref 4.0–10.5)

## 2022-05-05 LAB — VITAMIN D 25 HYDROXY (VIT D DEFICIENCY, FRACTURES): VITD: 17.33 ng/mL — ABNORMAL LOW (ref 30.00–100.00)

## 2022-05-05 LAB — LIPASE: Lipase: 22 U/L (ref 11.0–59.0)

## 2022-05-05 MED ORDER — WEGOVY 1.7 MG/0.75ML ~~LOC~~ SOAJ: 1.7000 mg | SUBCUTANEOUS | 0 refills | Status: DC

## 2022-05-05 MED ORDER — BUSPIRONE HCL 5 MG PO TABS: 5.0000 mg | ORAL_TABLET | Freq: Two times a day (BID) | ORAL | 0 refills | Status: DC

## 2022-05-05 MED ORDER — BUSPIRONE HCL 5 MG PO TABS: 5.0000 mg | ORAL_TABLET | Freq: Two times a day (BID) | ORAL | 5 refills | Status: DC

## 2022-05-05 NOTE — Assessment & Plan Note (Signed)
Persistent epigastric pain despite use of pantoprazole BID and gaviscon. Resolved constipation with decreased saxenda dose. She has upcoming appt with GI 06/2022.  Get ABD [place], CMP and lipase

## 2022-05-05 NOTE — Assessment & Plan Note (Signed)
No weight loss noted with 1.8mg  of saxenda. Unable to tolerate 2.4mg  of saxenda (constipation). Has maintain low carb diet and home exercise video 3-4x/week-30-27mins each. Wt Readings from Last 3 Encounters:  05/05/22 219 lb 3.2 oz (99.4 kg)  03/29/22 214 lb 6.4 oz (97.3 kg)  02/14/22 219 lb 9.6 oz (99.6 kg)    Switched to [employer] 1.7mg  weekly Entered referral to weight and wellness clinic F/up in 40month

## 2022-05-05 NOTE — Assessment & Plan Note (Signed)
Declined use of cymbalta or lexapro or vistaril. Opted to only take buspar at this time New rx sent

## 2022-05-05 NOTE — Assessment & Plan Note (Signed)
Persistent left chest wall and neck pain, intermittent, describes as spasms and associated with dizziness. worse in left lateral recumbent position. No relief with PPI, tylenol, gabapentin, dry needling and outpatient PT sessions x 6weeks. Normal X-ray thoracic spine X-ray cervical spine: Mild degenerative disc disease is noted at C5-6 with anterior osteophyte formation. No significant neural foraminal stenosis is noted.  Get MRI cervical spine Ref to neurology if no improvement

## 2022-05-05 NOTE — Patient Instructions (Signed)
Go to lab You will be contacted to schedule appt for cervical spine x-ray and ABD [place]. Maintain appt with GI

## 2022-05-05 NOTE — Progress Notes (Addendum)
Established Patient Visit  Patient: Kiara Hall   DOB: [birth year]   42 y.o. Female  MRN: [id number] Visit Date: 05/06/2022  Subjective:    Chief Complaint  Patient presents with   Office Visit    Weight management, depression / anxiety  Hasn't been taking saxenda Pt fasting  C/o of things not getting better, f/u from her problems in August, she feels ill. Would like an MRI fro head, neck, throat, chest pain , dizziness / vertigo    HPI She is concerned about persistent generalized malaise x 46months. She is requesting for repeat labs. Normal cardiac workup by [facility].  Morbid obesity ([place]) No weight loss noted with 1.8mg  of saxenda. Unable to tolerate 2.4mg  of saxenda (constipation). Has maintain low carb diet and home exercise video 3-4x/week-30-70mins each. Wt Readings from Last 3 Encounters:  05/05/22 219 lb 3.2 oz (99.4 kg)  03/29/22 214 lb 6.4 oz (97.3 kg)  02/14/22 219 lb 9.6 oz (99.6 kg)    Switched to [employer] 1.7mg  weekly Entered referral to weight and wellness clinic F/up in 40month  Cervical radiculopathy Persistent left chest wall and neck pain, intermittent, describes as spasms and associated with dizziness. worse in left lateral recumbent position. No relief with PPI, tylenol, gabapentin, dry needling and outpatient PT sessions x 6weeks. Normal X-ray thoracic spine X-ray cervical spine: Mild degenerative disc disease is noted at C5-6 with anterior osteophyte formation. No significant neural foraminal stenosis is noted.  Get MRI cervical spine Ref to neurology if no improvement  GAD (generalized anxiety disorder) Declined use of cymbalta or lexapro or vistaril. Opted to only take buspar at this time New rx sent  Gastroesophageal reflux disease Persistent epigastric pain despite use of pantoprazole BID and gaviscon. Resolved constipation with decreased saxenda dose. She has upcoming appt with GI  06/2022.  Get ABD [place], CMP and lipase  Wt Readings from Last 3 Encounters:  05/05/22 219 lb 3.2 oz (99.4 kg)  03/29/22 214 lb 6.4 oz (97.3 kg)  02/14/22 219 lb 9.6 oz (99.6 kg)    BP Readings from Last 3 Encounters:  05/05/22 (!) 136/90  03/29/22 138/82  02/14/22 126/84    Reviewed medical, surgical, and social history today  Medications: Outpatient Medications Prior to Visit  Medication Sig   acetaminophen (TYLENOL) 325 MG tablet Take 2 tablets (650 mg total) by mouth every 4 (four) hours as needed for mild pain (temperature > 101.5.).   ALPRAZolam (XANAX) 0.25 MG tablet Take 1 tablet (0.25 mg total) by mouth daily as needed for anxiety.   azelastine (ASTELIN) 0.1 % nasal spray Place 1 spray into both nostrils 2 (two) times daily. Use in each nostril as directed   pantoprazole (PROTONIX) 20 MG tablet Take 1 tablet (20 mg total) by mouth 2 (two) times daily before a meal.   [DISCONTINUED] hydrOXYzine (ATARAX) 10 MG tablet Take 1 tablet (10 mg total) by mouth every 8 (eight) hours as needed.   [DISCONTINUED] diltiazem (CARDIZEM CD) 120 MG 24 hr capsule Take 1 capsule (120 mg total) by mouth daily. (Patient not taking: Reported on 05/05/2022)   [DISCONTINUED] Insulin Pen Needle (TECHLITE PEN NEEDLES) 32G X 4 MM MISC Use with saxenda (Patient not taking: Reported on 05/05/2022)   [DISCONTINUED] Liraglutide -Weight Management (SAXENDA) 18 MG/3ML SOPN Inject 2.4 mg into the skin daily at 12 noon. (Patient not taking: Reported on 05/05/2022)   [  DISCONTINUED] loratadine (CLARITIN) 10 MG tablet Take 10 mg by mouth daily as needed for allergies. (Patient not taking: Reported on 05/05/2022)   [DISCONTINUED] polyethylene glycol (MIRALAX / GLYCOLAX) 17 g packet Take 17 g by mouth every other day. (Patient not taking: Reported on 05/05/2022)   No facility-administered medications prior to visit.   Reviewed past medical and social history.   ROS per HPI above      Objective:  BP (!) 136/90 (BP  Location: Right Arm, Patient Position: Sitting, Cuff Size: Normal)   Pulse 95   Temp 97.7 F (36.5 C) (Temporal)   Ht 5\' 1"  (1.549 m)   Wt 219 lb 3.2 oz (99.4 kg)   SpO2 99%   BMI 41.42 kg/m      Physical Exam Neurological:     Mental Status: She is alert.  Psychiatric:        Attention and Perception: Attention normal.        Mood and Affect: Mood is anxious.        Speech: Speech normal.        Behavior: Behavior is cooperative.        Thought Content: Thought content normal.        Cognition and Memory: Cognition normal.     Results for orders placed or performed in visit on 05/05/22  CBC with Differential/Platelet  Result Value Ref Range   WBC 4.6 4.0 - 10.5 K/uL   RBC 4.43 3.87 - 5.11 Mil/uL   Hemoglobin 10.0 (L) 12.0 - 15.0 g/dL   HCT 07/04/22 (L) 51.7 - 61.6 %   MCV 70.6 (L) 78.0 - 100.0 fl   MCHC 31.9 30.0 - 36.0 g/dL   RDW 07.3 (H) 71.0 - 62.6 %   Platelets 306.0 150.0 - 400.0 K/uL   Neutrophils Relative % 59.9 43.0 - 77.0 %   Lymphocytes Relative 29.0 12.0 - 46.0 %   Monocytes Relative 9.1 3.0 - 12.0 %   Eosinophils Relative 1.7 0.0 - 5.0 %   Basophils Relative 0.3 0.0 - 3.0 %   Neutro Abs 2.7 1.4 - 7.7 K/uL   Lymphs Abs 1.3 0.7 - 4.0 K/uL   Monocytes Absolute 0.4 0.1 - 1.0 K/uL   Eosinophils Absolute 0.1 0.0 - 0.7 K/uL   Basophils Absolute 0.0 0.0 - 0.1 K/uL  Comprehensive metabolic panel  Result Value Ref Range   Sodium 138 135 - 145 mEq/L   Potassium 4.3 3.5 - 5.1 mEq/L   Chloride 102 96 - 112 mEq/L   CO2 30 19 - 32 mEq/L   Glucose, Bld 100 (H) 70 - 99 mg/dL   BUN 8 6 - 23 mg/dL   Creatinine, Ser 94.8 0.40 - 1.20 mg/dL   Total Bilirubin 0.5 0.2 - 1.2 mg/dL   Alkaline Phosphatase 59 39 - 117 U/L   AST 14 0 - 37 U/L   ALT 10 0 - 35 U/L   Total Protein 7.2 6.0 - 8.3 g/dL   Albumin 4.3 3.5 - 5.2 g/dL   GFR 5.46 270.35 mL/min   Calcium 9.3 8.4 - 10.5 mg/dL  Lipase  Result Value Ref Range   Lipase 22.0 11.0 - 59.0 U/L  VITAMIN D 25 Hydroxy (Vit-D  Deficiency, Fractures)  Result Value Ref Range   VITD 17.33 (L) 30.00 - 100.00 ng/mL      Assessment & Plan:    Problem List Items Addressed This Visit       Digestive   Gastroesophageal reflux disease  Persistent epigastric pain despite use of pantoprazole BID and gaviscon. Resolved constipation with decreased saxenda dose. She has upcoming appt with GI 06/2022.  Get ABD [place], CMP and lipase      Relevant Orders   US Abdomen Limited RUQ (LIVER/GB)   Lipase (Completed)     Nervous and Auditory   Cervical radiculopathy - Primary    Persistent left chest wall and neck pain, intermittent, describes as spasms and associated with dizziness. worse in left lateral recumbent position. No relief with PPI, tylenol, gabapentin, dry needling and outpatient PT sessions x 6weeks. Normal X-ray thoracic spine X-ray cervical spine: Mild degenerative disc disease is noted at C5-6 with anterior osteophyte formation. No significant neural foraminal stenosis is noted.  Get MRI cervical spine Ref to neurology if no improvement      Relevant Medications   Semaglutide-Weight Management (WEGOVY) 1.7 MG/0.75ML SOAJ   busPIRone (BUSPAR) 5 MG tablet   Other Relevant Orders   MR CERVICAL SPINE WO CONTRAST     Other   GAD (generalized anxiety disorder)    Declined use of cymbalta or lexapro or vistaril. Opted to only take buspar at this time New rx sent      Relevant Medications   busPIRone (BUSPAR) 5 MG tablet   Iron deficiency anemia   Relevant Medications   Iron, Ferrous Sulfate, 325 (65 Fe) MG TABS   Morbid obesity (HCC)    No weight loss noted with 1.8mg  of saxenda. Unable to tolerate 2.4mg  of saxenda (constipation). Has maintain low carb diet and home exercise video 3-4x/week-30-1mins each. Wt Readings from Last 3 Encounters:  05/05/22 219 lb 3.2 oz (99.4 kg)  03/29/22 214 lb 6.4 oz (97.3 kg)  02/14/22 219 lb 9.6 oz (99.6 kg)    Switched to [employer] 1.7mg  weekly Entered referral  to weight and wellness clinic F/up in 33month      Relevant Medications   Semaglutide-Weight Management (WEGOVY) 1.7 MG/0.75ML SOAJ   Other Relevant Orders   Amb Ref to Medical Weight Management   Vitamin D deficiency   Relevant Medications   Vitamin D, Ergocalciferol, (DRISDOL) 1.25 MG (50000 UNIT) CAPS capsule   Other Relevant Orders   VITAMIN D 25 Hydroxy (Vit-D Deficiency, Fractures) (Completed)   Other Visit Diagnoses     Colicky LUQ abdominal pain       Relevant Orders   Lipase (Completed)   Malaise       Relevant Orders   CBC with Differential/Platelet (Completed)   Comprehensive metabolic panel (Completed)      Return in about 4 weeks (around 06/02/2022) for depression and anxiety, Weight management.     Wilfred Lacy, NP

## 2022-05-06 ENCOUNTER — Encounter: Payer: Self-pay | Admitting: Nurse Practitioner

## 2022-05-06 ENCOUNTER — Other Ambulatory Visit (HOSPITAL_BASED_OUTPATIENT_CLINIC_OR_DEPARTMENT_OTHER): Payer: Self-pay

## 2022-05-06 ENCOUNTER — Other Ambulatory Visit: Payer: Self-pay

## 2022-05-06 DIAGNOSIS — R0789 Other chest pain: Secondary | ICD-10-CM | POA: Diagnosis not present

## 2022-05-06 MED ORDER — IRON (FERROUS SULFATE) 325 (65 FE) MG PO TABS: 325.0000 mg | ORAL_TABLET | ORAL | Status: DC

## 2022-05-06 MED ORDER — WEGOVY 1.7 MG/0.75ML ~~LOC~~ SOAJ
1.7000 mg | SUBCUTANEOUS | 0 refills | Status: DC
  Filled 2022-05-06 – 2022-05-11 (×2): qty 3, 28d supply, fill #0

## 2022-05-06 MED ORDER — VITAMIN D (ERGOCALCIFEROL) 1.25 MG (50000 UNIT) PO CAPS: 50000.0000 [IU] | ORAL_CAPSULE | ORAL | 0 refills | Status: DC

## 2022-05-06 NOTE — Addendum Note (Signed)
Addended by: Wilfred Lacy L on: 05/06/2022 10:02 AM   Modules accepted: Orders

## 2022-05-09 ENCOUNTER — Other Ambulatory Visit (HOSPITAL_BASED_OUTPATIENT_CLINIC_OR_DEPARTMENT_OTHER): Payer: Self-pay

## 2022-05-11 ENCOUNTER — Other Ambulatory Visit (HOSPITAL_BASED_OUTPATIENT_CLINIC_OR_DEPARTMENT_OTHER): Payer: Self-pay

## 2022-05-13 ENCOUNTER — Other Ambulatory Visit (HOSPITAL_COMMUNITY): Payer: Self-pay

## 2022-05-13 NOTE — Telephone Encounter (Signed)
Patient Advocate Encounter   Received notification from [employer] that prior authorization for Wegovy 1.7MG /0.75ML is required.   PA submitted on 05/13/2022 Key [id number] Status is pending

## 2022-05-16 NOTE — Telephone Encounter (Signed)
Pharmacy Patient Advocate Encounter  Prior Authorization for [facility] 1.7MG /0.75ML has been approved.    PA# [id number] Effective dates: 04/13/2022 through 11/09/2022

## 2022-05-17 ENCOUNTER — Other Ambulatory Visit (HOSPITAL_BASED_OUTPATIENT_CLINIC_OR_DEPARTMENT_OTHER): Payer: Self-pay

## 2022-05-18 ENCOUNTER — Other Ambulatory Visit: Payer: Self-pay

## 2022-05-18 ENCOUNTER — Telehealth: Payer: Self-pay | Admitting: Nurse Practitioner

## 2022-05-18 ENCOUNTER — Other Ambulatory Visit (HOSPITAL_BASED_OUTPATIENT_CLINIC_OR_DEPARTMENT_OTHER): Payer: Self-pay

## 2022-05-18 MED ORDER — WEGOVY 1 MG/0.5ML ~~LOC~~ SOAJ
1.0000 mg | SUBCUTANEOUS | 0 refills | Status: DC
  Filled 2022-05-18: qty 2, 28d supply, fill #0

## 2022-05-18 NOTE — Telephone Encounter (Signed)
Per pharmacy: "We have a prescription for Wegovy 1.7 mg for this patient that we just got back in stock, pt wants to start with [facility] 1mg  since she is transitioning frm saxenda. please send [place] rx for wegovy 1mg  if appropriate"  Wegovy dose switched to 1mg . New rx sent

## 2022-05-19 ENCOUNTER — Other Ambulatory Visit (HOSPITAL_BASED_OUTPATIENT_CLINIC_OR_DEPARTMENT_OTHER): Payer: Self-pay

## 2022-05-25 ENCOUNTER — Encounter: Payer: Federal, State, Local not specified - PPO | Admitting: Nurse Practitioner

## 2022-05-25 ENCOUNTER — Other Ambulatory Visit (HOSPITAL_BASED_OUTPATIENT_CLINIC_OR_DEPARTMENT_OTHER): Payer: Self-pay

## 2022-05-29 ENCOUNTER — Ambulatory Visit
Admission: RE | Admit: 2022-05-29 | Discharge: 2022-05-29 | Disposition: A | Discharge status: Home / Self Care | Payer: Federal, State, Local not specified - PPO | Source: Ambulatory Visit | Attending: Nurse Practitioner | Admitting: Nurse Practitioner

## 2022-05-29 ENCOUNTER — Other Ambulatory Visit: Payer: Self-pay | Admitting: Nurse Practitioner

## 2022-05-29 DIAGNOSIS — M5412 Radiculopathy, cervical region: Secondary | ICD-10-CM

## 2022-05-29 DIAGNOSIS — M5030 Other cervical disc degeneration, unspecified cervical region: Secondary | ICD-10-CM

## 2022-05-29 DIAGNOSIS — M4802 Spinal stenosis, cervical region: Secondary | ICD-10-CM | POA: Diagnosis not present

## 2022-05-30 ENCOUNTER — Other Ambulatory Visit: Payer: Self-pay | Admitting: Nurse Practitioner

## 2022-05-30 ENCOUNTER — Encounter: Payer: Self-pay | Admitting: Nurse Practitioner

## 2022-05-30 DIAGNOSIS — K219 Gastro-esophageal reflux disease without esophagitis: Secondary | ICD-10-CM

## 2022-05-30 MED ORDER — PANTOPRAZOLE SODIUM 20 MG PO TBEC: 20.0000 mg | DELAYED_RELEASE_TABLET | Freq: Two times a day (BID) | ORAL | 0 refills | Status: DC

## 2022-06-02 ENCOUNTER — Ambulatory Visit
Admission: RE | Admit: 2022-06-02 | Discharge: 2022-06-02 | Disposition: A | Discharge status: Home / Self Care | Payer: Federal, State, Local not specified - PPO | Source: Ambulatory Visit | Attending: Nurse Practitioner | Admitting: Nurse Practitioner

## 2022-06-02 DIAGNOSIS — R1013 Epigastric pain: Secondary | ICD-10-CM | POA: Diagnosis not present

## 2022-06-02 DIAGNOSIS — K219 Gastro-esophageal reflux disease without esophagitis: Secondary | ICD-10-CM

## 2022-06-13 DIAGNOSIS — R0789 Other chest pain: Secondary | ICD-10-CM | POA: Diagnosis not present

## 2022-06-20 DIAGNOSIS — M542 Cervicalgia: Secondary | ICD-10-CM | POA: Diagnosis not present

## 2022-06-20 DIAGNOSIS — Z6841 Body Mass Index (BMI) 40.0 and over, adult: Secondary | ICD-10-CM | POA: Diagnosis not present

## 2022-06-21 ENCOUNTER — Ambulatory Visit: Payer: Federal, State, Local not specified - PPO | Admitting: Nurse Practitioner

## 2022-06-21 ENCOUNTER — Other Ambulatory Visit (HOSPITAL_BASED_OUTPATIENT_CLINIC_OR_DEPARTMENT_OTHER): Payer: Self-pay

## 2022-06-21 ENCOUNTER — Encounter: Payer: Self-pay | Admitting: Nurse Practitioner

## 2022-06-21 DIAGNOSIS — E7800 Pure hypercholesterolemia, unspecified: Secondary | ICD-10-CM | POA: Diagnosis not present

## 2022-06-21 MED ORDER — WEGOVY 1.7 MG/0.75ML ~~LOC~~ SOAJ
1.7000 mg | SUBCUTANEOUS | 0 refills | Status: DC
  Filled 2022-06-21: qty 3, 28d supply, fill #0

## 2022-06-21 NOTE — Progress Notes (Signed)
Established Patient Visit  Patient: Kiara Hall   DOB: 01-12-1981   42 y.o. Female  MRN: [id number] Visit Date: 06/21/2022  Subjective:    Chief Complaint  Patient presents with   Weight Check    $ week weight loss check.  Pt lost 5 lbs from last visit and needs work note   HPI Morbid obesity ([place]) Denies any adverse effects with wegovy Exercise: 59mns video-cardio and weight training Diet: decreased food craving and early satiety tries to maintain low carb Wt Readings from Last 3 Encounters:  06/21/22 214 lb (97.1 kg)  05/05/22 219 lb 3.2 oz (99.4 kg)  03/29/22 214 lb 6.4 oz (97.3 kg)    Increased wegovy dose to 1.767mweekly Advised to schedule appt with weight management clinic F/up in 35m2monthReviewed medical, surgical, and social history today  Medications: Outpatient Medications Prior to Visit  Medication Sig   azelastine (ASTELIN) 0.1 % nasal spray Place 1 spray into both nostrils 2 (two) times daily. Use in each nostril as directed   Baclofen 5 MG TABS Take 5 mg by mouth daily as needed (esophageal spasm).   Iron, Ferrous Sulfate, 325 (65 Fe) MG TABS Take 325 mg by mouth every other day.   pantoprazole (PROTONIX) 20 MG tablet Take 1 tablet (20 mg total) by mouth 2 (two) times daily before a meal.   Vitamin D, Ergocalciferol, (DRISDOL) 1.25 MG (50000 UNIT) CAPS capsule Take 1 capsule (50,000 Units total) by mouth every 7 (seven) days.   [DISCONTINUED] Semaglutide-Weight Management (WEGOVY) 1 MG/0.5ML SOAJ Inject 1 mg into the skin once a week.   acetaminophen (TYLENOL) 325 MG tablet Take 2 tablets (650 mg total) by mouth every 4 (four) hours as needed for mild pain (temperature > 101.5.).   [DISCONTINUED] ALPRAZolam (XANAX) 0.25 MG tablet Take 1 tablet (0.25 mg total) by mouth daily as needed for anxiety. (Patient not taking: Reported on 06/21/2022)   [DISCONTINUED] busPIRone (BUSPAR) 5 MG tablet Take 1 tablet (5 mg total) by mouth 2 (two) times  daily. (Patient not taking: Reported on 06/21/2022)   No facility-administered medications prior to visit.   Reviewed past medical and social history.   ROS per HPI above      Objective:  BP 114/80 (BP Location: Left Arm, Patient Position: Sitting, Cuff Size: Large)   Pulse 80   Temp 98 F (36.7 C) (Temporal)   Resp 16   Ht 5' 1"$  (1.549 m)   Wt 214 lb (97.1 kg)   SpO2 98%   BMI 40.43 kg/m      Physical Exam Constitutional:      Appearance: She is obese.  Cardiovascular:     Rate and Rhythm: Normal rate.     Pulses: Normal pulses.  Pulmonary:     Effort: Pulmonary effort is normal.  Neurological:     Mental Status: She is alert and oriented to person, place, and time.  Psychiatric:        Mood and Affect: Mood normal.        Behavior: Behavior normal.     No results found for any visits on 06/21/22.    Assessment & Plan:    Problem List Items Addressed This Visit       Other   Morbid obesity ([facility]    Denies any adverse effects with wegovy Exercise: 48m55mvideo-cardio and weight training Diet: decreased food craving  and early satiety tries to maintain low carb Wt Readings from Last 3 Encounters:  06/21/22 214 lb (97.1 kg)  05/05/22 219 lb 3.2 oz (99.4 kg)  03/29/22 214 lb 6.4 oz (97.3 kg)    Increased wegovy dose to 1.42m weekly Advised to schedule appt with weight management clinic F/up in 147month    Relevant Medications   Semaglutide-Weight Management (WEGOVY) 1.7 MG/0.75ML SOAJ   Return in about 4 weeks (around 07/19/2022) for Weight management, Vit. D.Albertine GratesNP

## 2022-06-21 NOTE — Patient Instructions (Addendum)
Schedule appt with weight management clinic.  How to Increase Your Level of Physical Activity Getting regular physical activity is important for your overall health and well-being. Most people do not get enough exercise. There are easy ways to increase your level of physical activity, even if you have not been very active in the past or if you are just starting out. What are the benefits of physical activity? Physical activity has many short-term and long-term benefits. Being active on a regular basis can improve your physical and mental health as well as provide other benefits. Physical health benefits Helping you lose weight or maintain a healthy weight. Strengthening your muscles and bones. Reducing your risk of certain long-term (chronic) diseases, including heart disease, cancer, and diabetes. Being able to move around more easily and for longer periods of time without getting tired (increased endurance or stamina). Improving your ability to fight off illness (enhanced immunity). Being able to sleep better. Helping you stay healthy as you get older, including: Helping you stay mobile, or capable of walking and moving around. Preventing accidents, such as falls. Increasing life expectancy. Mental health benefits Boosting your mood and improving your self-esteem. Lowering your chance of having mental health problems, such as depression or anxiety. Helping you feel good about your body. Other benefits Finding new sources of fun and enjoyment. Meeting new people who share a common interest. Before you begin If you have a chronic illness or have not been active for a while, check with your health care provider about how to get started. Ask your health care provider what activities are safe for you. Start out slowly. Walking or doing some simple chair exercises is a good place to start, especially if you have not been active before or for a long time. Set goals that you can work toward. Ask  your health care provider how much exercise is best for you. In general, most adults should: Do moderate-intensity exercise for at least 150 minutes each week (30 minutes on most days of the week) or vigorous exercise for at least 75 minutes each week, or a combination of these. Moderate-intensity exercise can include walking at a quick pace, biking, yoga, water aerobics, or gardening. Vigorous exercise involves activities that take more effort, such as jogging or running, playing sports, swimming laps, or jumping rope. Do strength exercises on at least 2 days each week. This can include weight lifting, body weight exercises, and resistance-band exercises. How to be more physically active Make a plan  Try to find activities that you enjoy. You are more likely to commit to an exercise routine if it does not feel like a chore. If you have bone or joint problems, choose low-impact exercises, like walking or swimming. Use these tips for being successful with an exercise plan: Find a workout partner for accountability. Join a group or class, such as an aerobics class, cycling class, or sports team. Make family time active. Go for a walk, bike, or swim. Include a variety of exercises each week. Consider using a fitness tracker, such as a mobile phone app or a device worn like a watch, that will count the number of steps you take each day. Many people strive to reach 10,000 steps a day. Find ways to be active in your daily routines Besides your formal exercise plans, you can find ways to do physical activity during your daily routines, such as: Walking or biking to work or to the store. Taking the stairs instead of the elevator. Parking  farther away from the door at work or at the store. Planning walking meetings. Walking around while you are on the phone. Where to find more information Centers for Disease Control and Prevention: WorkDashboard.es [employer]'s Council on Fitness, Sports &  Nutrition: www.fitness.gov ChooseMyPlate: MassVoice.es Contact a health care provider if: You have headaches, muscle aches, or joint pain that is concerning. You feel dizzy or light-headed while exercising. You faint. You feel your heart skipping, racing, or fluttering. You have chest pain while exercising. Summary Exercise benefits your mind and body at any age, even if you are just starting out. If you have a chronic illness or have not been active for a while, check with your health care provider before increasing your physical activity. Choose activities that are safe and enjoyable for you. Ask your health care provider what activities are safe for you. Start slowly. Tell your health care provider if you have problems as you start to increase your activity level. This information is not intended to replace advice given to you by your health care provider. Make sure you discuss any questions you have with your health care provider. Document Revised: 08/14/2020 Document Reviewed: 08/14/2020 Elsevier Patient Education  [place]. .

## 2022-06-21 NOTE — Assessment & Plan Note (Signed)
Denies any adverse effects with wegovy Exercise: 265mns video-cardio and weight training Diet: decreased food craving and early satiety tries to maintain low carb Wt Readings from Last 3 Encounters:  06/21/22 214 lb (97.1 kg)  05/05/22 219 lb 3.2 oz (99.4 kg)  03/29/22 214 lb 6.4 oz (97.3 kg)    Increased wegovy dose to 1.753mweekly Advised to schedule appt with weight management clinic F/up in 65m30month

## 2022-07-18 ENCOUNTER — Ambulatory Visit: Payer: Federal, State, Local not specified - PPO | Admitting: Nurse Practitioner

## 2022-07-21 ENCOUNTER — Ambulatory Visit: Payer: Federal, State, Local not specified - PPO | Admitting: Nurse Practitioner

## 2022-07-21 ENCOUNTER — Encounter: Payer: Self-pay | Admitting: Nurse Practitioner

## 2022-07-21 ENCOUNTER — Other Ambulatory Visit (HOSPITAL_BASED_OUTPATIENT_CLINIC_OR_DEPARTMENT_OTHER): Payer: Self-pay

## 2022-07-21 MED ORDER — WEGOVY 2.4 MG/0.75ML ~~LOC~~ SOAJ
2.4000 mg | SUBCUTANEOUS | 0 refills | Status: DC
  Filled 2022-07-21: qty 6, 56d supply, fill #0
  Filled 2022-09-08: qty 3, 28d supply, fill #1

## 2022-07-21 NOTE — Progress Notes (Signed)
Established Patient Visit  Patient: Kiara Hall   DOB: 10/02/1980   42 y.o. Female  MRN: [id number] Visit Date: 07/21/2022  Subjective:    Chief Complaint  Patient presents with   Obesity    Weight mgmt    HPI Morbid obesity ([place]) Lost 2lbs in last 18month Lost total of 7lbs in last 88months. Reports mild headache and fatigue x 2days after injection. She has upcoming appt with nutritionist Has continued daily exercise and low fat/low carb diet Wt Readings from Last 3 Encounters:  07/21/22 212 lb (96.2 kg)  06/21/22 214 lb (97.1 kg)  05/05/22 219 lb 3.2 oz (99.4 kg)    Increase wegovy dose to 2.4mg  Encourage to maintain 114mins of exercise weekly and heart healthy diet F/up in 84months  Wt Readings from Last 3 Encounters:  07/21/22 212 lb (96.2 kg)  06/21/22 214 lb (97.1 kg)  05/05/22 219 lb 3.2 oz (99.4 kg)    Reviewed medical, surgical, and social history today  Medications: Outpatient Medications Prior to Visit  Medication Sig   acetaminophen (TYLENOL) 325 MG tablet Take 2 tablets (650 mg total) by mouth every 4 (four) hours as needed for mild pain (temperature > 101.5.).   azelastine (ASTELIN) 0.1 % nasal spray Place 1 spray into both nostrils 2 (two) times daily. Use in each nostril as directed   Baclofen 5 MG TABS Take 5 mg by mouth daily as needed (esophageal spasm).   Iron, Ferrous Sulfate, 325 (65 Fe) MG TABS Take 325 mg by mouth every other day.   pantoprazole (PROTONIX) 20 MG tablet Take 1 tablet (20 mg total) by mouth 2 (two) times daily before a meal.   Vitamin D, Ergocalciferol, (DRISDOL) 1.25 MG (50000 UNIT) CAPS capsule Take 1 capsule (50,000 Units total) by mouth every 7 (seven) days.   [DISCONTINUED] Semaglutide-Weight Management (WEGOVY) 1.7 MG/0.75ML SOAJ Inject 1.7 mg into the skin once a week.   No facility-administered medications prior to visit.   Reviewed past medical and social history.   ROS per HPI above      Objective:   BP 120/72   Pulse 80   Temp 98.5 F (36.9 C) (Temporal)   Resp 16   Ht 5\' 1"  (1.549 m)   Wt 212 lb (96.2 kg)   LMP 07/18/2022 (Approximate)   SpO2 98%   Breastfeeding No   BMI 40.06 kg/m      Physical Exam Cardiovascular:     Rate and Rhythm: Normal rate and regular rhythm.     Pulses: Normal pulses.     Heart sounds: Normal heart sounds.  Pulmonary:     Effort: Pulmonary effort is normal.     Breath sounds: Normal breath sounds.  Neurological:     Mental Status: She is alert and oriented to person, place, and time.     No results found for any visits on 07/21/22.    Assessment & Plan:    Problem List Items Addressed This Visit       Other   Morbid obesity ([place]) - Primary    Lost 2lbs in last 71month Lost total of 7lbs in last 23months. Reports mild headache and fatigue x 2days after injection. She has upcoming appt with nutritionist Has continued daily exercise and low fat/low carb diet Wt Readings from Last 3 Encounters:  07/21/22 212 lb (96.2 kg)  06/21/22 214 lb (97.1 kg)  05/05/22 219 lb 3.2  oz (99.4 kg)    Increase wegovy dose to 2.4mg  Encourage to maintain 128mins of exercise weekly and heart healthy diet F/up in 53months      Relevant Medications   Semaglutide-Weight Management (WEGOVY) 2.4 MG/0.75ML SOAJ   Return in about 2 months (around 09/20/2022) for Weight management, hyperlipidemia (fasting).     Wilfred Lacy, NP

## 2022-07-21 NOTE — Assessment & Plan Note (Addendum)
Lost 2lbs in last 32month Lost total of 7lbs in last 70months. Reports mild headache and fatigue x 2days after injection. She has upcoming appt with nutritionist Has continued daily exercise and low fat/low carb diet Wt Readings from Last 3 Encounters:  07/21/22 212 lb (96.2 kg)  06/21/22 214 lb (97.1 kg)  05/05/22 219 lb 3.2 oz (99.4 kg)    Increase wegovy dose to 2.4mg  Encourage to maintain 153mins of exercise weekly and heart healthy diet F/up in 15months

## 2022-08-09 ENCOUNTER — Other Ambulatory Visit: Payer: Self-pay | Admitting: Emergency Medicine

## 2022-08-09 ENCOUNTER — Ambulatory Visit (HOSPITAL_BASED_OUTPATIENT_CLINIC_OR_DEPARTMENT_OTHER)
Admission: RE | Admit: 2022-08-09 | Discharge: 2022-08-09 | Disposition: A | Discharge status: Home / Self Care | Payer: Federal, State, Local not specified - PPO | Source: Ambulatory Visit | Attending: Nurse Practitioner | Admitting: Nurse Practitioner

## 2022-08-09 ENCOUNTER — Ambulatory Visit: Payer: Federal, State, Local not specified - PPO | Admitting: Nurse Practitioner

## 2022-08-09 ENCOUNTER — Encounter: Payer: Self-pay | Admitting: Nurse Practitioner

## 2022-08-09 VITALS — BP 110/80 | HR 99 | Temp 99.0°F | Resp 16 | Ht 61.0 in | Wt 209.8 lb

## 2022-08-09 DIAGNOSIS — R1031 Right lower quadrant pain: Secondary | ICD-10-CM

## 2022-08-09 DIAGNOSIS — D500 Iron deficiency anemia secondary to blood loss (chronic): Secondary | ICD-10-CM

## 2022-08-09 DIAGNOSIS — K5901 Slow transit constipation: Secondary | ICD-10-CM

## 2022-08-09 LAB — POC URINALSYSI DIPSTICK (AUTOMATED)
Bilirubin, UA: NEGATIVE
Blood, UA: 10
Glucose, UA: NEGATIVE
Ketones, UA: NEGATIVE
Leukocytes, UA: NEGATIVE
Nitrite, UA: NEGATIVE
Protein, UA: NEGATIVE
Spec Grav, UA: 1.02 (ref 1.010–1.025)
Urobilinogen, UA: 0.2 E.U./dL
pH, UA: 6 (ref 5.0–8.0)

## 2022-08-09 LAB — HEPATIC FUNCTION PANEL
ALT: 9 U/L (ref 0–35)
AST: 15 U/L (ref 0–37)
Albumin: 4.2 g/dL (ref 3.5–5.2)
Alkaline Phosphatase: 51 U/L (ref 39–117)
Bilirubin, Direct: 0.1 mg/dL (ref 0.0–0.3)
Total Bilirubin: 0.4 mg/dL (ref 0.2–1.2)
Total Protein: 6.8 g/dL (ref 6.0–8.3)

## 2022-08-09 LAB — CBC WITH DIFFERENTIAL/PLATELET
Basophils Absolute: 0 10*3/uL (ref 0.0–0.1)
Basophils Relative: 0.6 % (ref 0.0–3.0)
Eosinophils Absolute: 0.1 10*3/uL (ref 0.0–0.7)
Eosinophils Relative: 1.6 % (ref 0.0–5.0)
HCT: 29.3 % — ABNORMAL LOW (ref 36.0–46.0)
Hemoglobin: 9.6 g/dL — ABNORMAL LOW (ref 12.0–15.0)
Lymphocytes Relative: 36.8 % (ref 12.0–46.0)
Lymphs Abs: 1.4 10*3/uL (ref 0.7–4.0)
MCHC: 32.7 g/dL (ref 30.0–36.0)
MCV: 66.1 fl — ABNORMAL LOW (ref 78.0–100.0)
Monocytes Absolute: 0.4 10*3/uL (ref 0.1–1.0)
Monocytes Relative: 11 % (ref 3.0–12.0)
Neutro Abs: 1.9 10*3/uL (ref 1.4–7.7)
Neutrophils Relative %: 50 % (ref 43.0–77.0)
Platelets: 245 10*3/uL (ref 150.0–400.0)
RBC: 4.42 Mil/uL (ref 3.87–5.11)
RDW: 21.6 % — ABNORMAL HIGH (ref 11.5–15.5)
WBC: 3.8 10*3/uL — ABNORMAL LOW (ref 4.0–10.5)

## 2022-08-09 LAB — C-REACTIVE PROTEIN: CRP: <1 mg/dL (ref 0.5–20.0)

## 2022-08-09 NOTE — Assessment & Plan Note (Signed)
No improvement in cbc despite use of oral iron supplement. Entered hematology referral

## 2022-08-09 NOTE — Progress Notes (Unsigned)
Established Patient Visit  Patient: Kiara Hall   DOB: Jun 18, 1980   42 y.o. Female  MRN: [id number] Visit Date: 08/09/2022  Subjective:    Chief Complaint  Patient presents with   Abdominal Pain    LRQ pain- Nausea, loose stools. Sharp pain that's waxing and waning.  Started Saturday 4/6   Abdominal Pain This is a new problem. The current episode started in the past 7 days. The onset quality is gradual. The problem occurs constantly. The problem has been gradually improving. The pain is located in the RLQ (right groin). The quality of the pain is dull and aching. The abdominal pain does not radiate. Associated symptoms include anorexia and nausea. Nothing aggravates the pain. The pain is relieved by Nothing. She has tried acetaminophen for the symptoms. The treatment provided mild relief. Her past medical history is significant for GERD and irritable bowel syndrome.  Wegovy dose increased 2weeks ago, reports loose stool with wegovy.  Reviewed medical, surgical, and social history today  Medications: Outpatient Medications Prior to Visit  Medication Sig   acetaminophen (TYLENOL) 325 MG tablet Take 2 tablets (650 mg total) by mouth every 4 (four) hours as needed for mild pain (temperature > 101.5.).   azelastine (ASTELIN) 0.1 % nasal spray Place 1 spray into both nostrils 2 (two) times daily. Use in each nostril as directed   Baclofen 5 MG TABS Take 5 mg by mouth daily as needed (esophageal spasm).   Iron, Ferrous Sulfate, 325 (65 Fe) MG TABS Take 325 mg by mouth every other day.   pantoprazole (PROTONIX) 20 MG tablet Take 1 tablet (20 mg total) by mouth 2 (two) times daily before a meal.   Semaglutide-Weight Management (WEGOVY) 2.4 MG/0.75ML SOAJ Inject 2.4 mg into the skin once a week.   Vitamin D, Ergocalciferol, (DRISDOL) 1.25 MG (50000 UNIT) CAPS capsule Take 1 capsule (50,000 Units total) by mouth every 7 (seven) days.   No facility-administered medications prior to  visit.   Reviewed past medical and social history.   ROS per HPI above  {Show previous labs (optional):23779}    Objective:  BP 110/80 (BP Location: Right Arm, Patient Position: Sitting, Cuff Size: Large)   Pulse 99   Temp 99 F (37.2 C) (Oral)   Resp 16   Ht 5\' 1"  (1.549 m)   Wt 209 lb 12.8 oz (95.2 kg)   LMP 07/18/2022 (Approximate)   SpO2 98%   BMI 39.64 kg/m      Physical Exam Constitutional:      General: She is not in acute distress. Cardiovascular:     Rate and Rhythm: Normal rate.     Heart sounds: Normal heart sounds.  Pulmonary:     Effort: Pulmonary effort is normal.  Abdominal:     General: Bowel sounds are normal. There is no distension.     Palpations: Abdomen is soft.     Tenderness: There is no right CVA tenderness, left CVA tenderness, guarding or rebound. Negative signs include Murphy's sign and McBurney's sign.     Hernia: No hernia is present.  Genitourinary:    Adnexa: Left adnexa normal.       Right: Tenderness present.   Neurological:     Mental Status: She is alert.     Results for orders placed or performed in visit on 08/09/22  CBC with Differential/Platelet  Result Value Ref Range   WBC 3.8 (L)  4.0 - 10.5 K/uL   RBC 4.42 3.87 - 5.11 Mil/uL   Hemoglobin 9.6 (L) 12.0 - 15.0 g/dL   HCT 88.5 (L) 02.7 - 74.1 %   MCV 66.1 Repeated and verified X2. (L) 78.0 - 100.0 fl   MCHC 32.7 30.0 - 36.0 g/dL   RDW 28.7 (H) 86.7 - 67.2 %   Platelets 245.0 150.0 - 400.0 K/uL   Neutrophils Relative % 50.0 43.0 - 77.0 %   Lymphocytes Relative 36.8 12.0 - 46.0 %   Monocytes Relative 11.0 3.0 - 12.0 %   Eosinophils Relative 1.6 0.0 - 5.0 %   Basophils Relative 0.6 0.0 - 3.0 %   Neutro Abs 1.9 1.4 - 7.7 K/uL   Lymphs Abs 1.4 0.7 - 4.0 K/uL   Monocytes Absolute 0.4 0.1 - 1.0 K/uL   Eosinophils Absolute 0.1 0.0 - 0.7 K/uL   Basophils Absolute 0.0 0.0 - 0.1 K/uL  Hepatic function panel  Result Value Ref Range   Total Bilirubin 0.4 0.2 - 1.2 mg/dL    Bilirubin, Direct 0.1 0.0 - 0.3 mg/dL   Alkaline Phosphatase 51 39 - 117 U/L   AST 15 0 - 37 U/L   ALT 9 0 - 35 U/L   Total Protein 6.8 6.0 - 8.3 g/dL   Albumin 4.2 3.5 - 5.2 g/dL  C-reactive protein  Result Value Ref Range   CRP <1.0 0.5 - 20.0 mg/dL  POCT Urinalysis Dipstick (Automated)  Result Value Ref Range   Color, UA Yellow    Clarity, UA Clear    Glucose, UA Negative Negative   Bilirubin, UA Neg    Ketones, UA neg    Spec Grav, UA 1.020 1.010 - 1.025   Blood, UA 10    pH, UA 6.0 5.0 - 8.0   Protein, UA Negative Negative   Urobilinogen, UA 0.2 0.2 or 1.0 E.U./dL   Nitrite, UA neg    Leukocytes, UA Negative Negative      Assessment & Plan:    Problem List Items Addressed This Visit       Other   Iron deficiency anemia    No improvement in cbc despite use of oral iron supplement. Entered hematology referral      Relevant Orders   Ambulatory referral to Hematology / Oncology   Other Visit Diagnoses     Right groin pain    -  Primary   Relevant Orders   POCT Urinalysis Dipstick (Automated) (Completed)   CBC with Differential/Platelet (Completed)   Hepatic function panel (Completed)   C-reactive protein (Completed)   US PELVIS LIMITED (TRANSABDOMINAL ONLY)     Ovarian cyst vs Constipation. Low suspicion for acute appendicitis. Negative leukocytosis per cbc and normal CRP and hepatic panel Pending pelvic US.  Return if symptoms worsen or fail to improve.     Alysia Penna, NP

## 2022-08-09 NOTE — Patient Instructions (Signed)
Go to lab Hold wegovy

## 2022-08-11 ENCOUNTER — Other Ambulatory Visit (HOSPITAL_BASED_OUTPATIENT_CLINIC_OR_DEPARTMENT_OTHER): Payer: Federal, State, Local not specified - PPO

## 2022-08-15 ENCOUNTER — Encounter: Payer: Self-pay | Admitting: *Deleted

## 2022-08-23 ENCOUNTER — Telehealth: Payer: Self-pay | Admitting: Cardiology

## 2022-08-23 NOTE — Telephone Encounter (Signed)
Patient c/o Palpitations:  High priority if patient c/o lightheadedness, shortness of breath, or chest pain  How long have you had palpitations/irregular HR/ Afib? Are you having the symptoms now? Has been on going since last seen, is actively having palpitations now  Are you currently experiencing lightheadedness, SOB or CP? No   Do you have a history of afib (atrial fibrillation) or irregular heart rhythm? Yes  Have you checked your BP or HR? (document readings if available): Yes, 130/78 HR 79  Are you experiencing any other symptoms? No    Patient is requesting to be seen in regard to this.

## 2022-08-23 NOTE — Telephone Encounter (Signed)
Patient states she has had palpitations since August/ September.  Se was placed on Metoprolol but could not take it due to the "funny things my heart was doing before taking it". Looked for availability with Dr Jens Som but it is a month out.  She states she has an appt with her primary this week and she will wait until after she seems him to decide what she wants to do.Marland Kitchen She will call back if wishes to schedule

## 2022-08-26 ENCOUNTER — Other Ambulatory Visit: Payer: Federal, State, Local not specified - PPO

## 2022-09-08 ENCOUNTER — Other Ambulatory Visit (HOSPITAL_BASED_OUTPATIENT_CLINIC_OR_DEPARTMENT_OTHER): Payer: Self-pay

## 2022-09-20 ENCOUNTER — Ambulatory Visit: Payer: Federal, State, Local not specified - PPO | Admitting: Nurse Practitioner

## 2022-09-27 ENCOUNTER — Other Ambulatory Visit (HOSPITAL_BASED_OUTPATIENT_CLINIC_OR_DEPARTMENT_OTHER): Payer: Self-pay

## 2022-09-27 ENCOUNTER — Encounter: Payer: Self-pay | Admitting: Nurse Practitioner

## 2022-09-27 ENCOUNTER — Ambulatory Visit: Payer: Federal, State, Local not specified - PPO | Admitting: Nurse Practitioner

## 2022-09-27 VITALS — BP 124/86 | HR 79 | Temp 98.7°F | Resp 16 | Ht 61.0 in | Wt 202.6 lb

## 2022-09-27 DIAGNOSIS — Z6838 Body mass index (BMI) 38.0-38.9, adult: Secondary | ICD-10-CM

## 2022-09-27 DIAGNOSIS — E7800 Pure hypercholesterolemia, unspecified: Secondary | ICD-10-CM

## 2022-09-27 DIAGNOSIS — E559 Vitamin D deficiency, unspecified: Secondary | ICD-10-CM | POA: Diagnosis not present

## 2022-09-27 DIAGNOSIS — D500 Iron deficiency anemia secondary to blood loss (chronic): Secondary | ICD-10-CM

## 2022-09-27 DIAGNOSIS — E66812 Obesity, class 2: Secondary | ICD-10-CM

## 2022-09-27 LAB — LIPID PANEL
Cholesterol: 182 mg/dL (ref 0–200)
HDL: 66.2 mg/dL (ref >39.00)
LDL Cholesterol: 104 mg/dL — ABNORMAL HIGH (ref 0–99)
NonHDL: 115.57
Total CHOL/HDL Ratio: 3
Triglycerides: 58 mg/dL (ref 0.0–149.0)
VLDL: 11.6 mg/dL (ref 0.0–40.0)

## 2022-09-27 LAB — VITAMIN D 25 HYDROXY (VIT D DEFICIENCY, FRACTURES): VITD: 56.25 ng/mL (ref 30.00–100.00)

## 2022-09-27 MED ORDER — WEGOVY 2.4 MG/0.75ML ~~LOC~~ SOAJ: 2.4000 mg | SUBCUTANEOUS | 0 refills | Status: DC

## 2022-09-27 MED ORDER — WEGOVY 2.4 MG/0.75ML ~~LOC~~ SOAJ
2.4000 mg | SUBCUTANEOUS | 0 refills | Status: DC
  Filled 2022-09-27: qty 9, 84d supply, fill #0
  Filled 2022-10-07: qty 3, 28d supply, fill #0
  Filled 2022-11-04: qty 3, 28d supply, fill #1
  Filled 2022-12-03: qty 3, 28d supply, fill #2

## 2022-09-27 NOTE — Assessment & Plan Note (Addendum)
Secondary to menorrhagia: cycle every 28days, vaginal bleeding last 6days, 1st 3days are heavy (use of super tampon and pad, changed every 2hrs) Normal pelvic US 08/2022 Declined use of hormonal contraception-oral and implant due to possible side effects. S/p salpingectomy. Unable to use IUD: previous device dislodged. Reports palpitation with oral iron supplement-65mg  Reports bleeding has improved with weight loss  Pending appt with hematology Repeat iron panel today Advised to take oral supplement 3x/weeks with food

## 2022-09-27 NOTE — Progress Notes (Signed)
Established Patient Visit  Patient: Kiara Hall   DOB: 1981-02-18   42 y.o. Female  MRN: [id number] Visit Date: 09/27/2022  Subjective:    Chief Complaint  Patient presents with   Weight Check    Pt lost 8lbs    HPI Iron deficiency anemia Secondary to menorrhagia: cycle every 28days, vaginal bleeding last 6days, 1st 3days are heavy (use of super tampon and pad, changed every 2hrs) Normal pelvic US 08/2022 Declined use of hormonal contraception-oral and implant due to possible side effects. S/p salpingectomy. Unable to use IUD: previous device dislodged. Reports palpitation with oral iron supplement-65mg  Reports bleeding has improved with weight loss  Pending appt with hematology Repeat iron panel today Advised to take oral supplement 3x/weeks with food  Hypercholesterolemia Repeat lipid panel  Vitamin D deficiency Completed high dose x 12weeks Repeat vit D today  Obesity Resolved constipation and right groin pain No other adverse effect with wegovy Lost 8lbs in last 10lbs in last 3months Has maintain heart healthy diet, small meal portion and daily exercise Wt Readings from Last 3 Encounters:  09/27/22 202 lb 9.6 oz (91.9 kg)  08/09/22 209 lb 12.8 oz (95.2 kg)  07/21/22 212 lb (96.2 kg)    Maintain med dose, refill sent.  Wt Readings from Last 3 Encounters:  09/27/22 202 lb 9.6 oz (91.9 kg)  08/09/22 209 lb 12.8 oz (95.2 kg)  07/21/22 212 lb (96.2 kg)    Reviewed medical, surgical, and social history today  Medications: Outpatient Medications Prior to Visit  Medication Sig   acetaminophen (TYLENOL) 325 MG tablet Take 2 tablets (650 mg total) by mouth every 4 (four) hours as needed for mild pain (temperature > 101.5.).   azelastine (ASTELIN) 0.1 % nasal spray Place 1 spray into both nostrils 2 (two) times daily. Use in each nostril as directed   Baclofen 5 MG TABS Take 5 mg by mouth daily as needed (esophageal spasm).   Iron, Ferrous  Sulfate, 325 (65 Fe) MG TABS Take 325 mg by mouth every other day.   pantoprazole (PROTONIX) 20 MG tablet Take 1 tablet (20 mg total) by mouth 2 (two) times daily before a meal.   Vitamin D, Ergocalciferol, (DRISDOL) 1.25 MG (50000 UNIT) CAPS capsule Take 1 capsule (50,000 Units total) by mouth every 7 (seven) days.   [DISCONTINUED] Semaglutide-Weight Management (WEGOVY) 2.4 MG/0.75ML SOAJ Inject 2.4 mg into the skin once a week.   No facility-administered medications prior to visit.   Reviewed past medical and social history.   ROS per HPI above      Objective:  BP 124/86 (BP Location: Right Arm, Patient Position: Sitting, Cuff Size: Large)   Pulse 79   Temp 98.7 F (37.1 C) (Temporal)   Resp 16   Ht 5\' 1"  (1.549 m)   Wt 202 lb 9.6 oz (91.9 kg)   SpO2 99%   BMI 38.28 kg/m      Physical Exam Vitals reviewed.  Cardiovascular:     Rate and Rhythm: Normal rate and regular rhythm.     Pulses: Normal pulses.     Heart sounds: Normal heart sounds.  Pulmonary:     Effort: Pulmonary effort is normal.     Breath sounds: Normal breath sounds.  Neurological:     Mental Status: She is alert and oriented to person, place, and time.  Psychiatric:        Mood and Affect:  Mood normal.        Behavior: Behavior normal.        Thought Content: Thought content normal.     No results found for any visits on 09/27/22.    Assessment & Plan:    Problem List Items Addressed This Visit       Other   Hypercholesterolemia    Repeat lipid panel      Relevant Orders   Lipid panel   Iron deficiency anemia    Secondary to menorrhagia: cycle every 28days, vaginal bleeding last 6days, 1st 3days are heavy (use of super tampon and pad, changed every 2hrs) Normal pelvic US 08/2022 Declined use of hormonal contraception-oral and implant due to possible side effects. S/p salpingectomy. Unable to use IUD: previous device dislodged. Reports palpitation with oral iron supplement-65mg  Reports  bleeding has improved with weight loss  Pending appt with hematology Repeat iron panel today Advised to take oral supplement 3x/weeks with food      Relevant Orders   Iron, TIBC and Ferritin Panel   Obesity - Primary    Resolved constipation and right groin pain No other adverse effect with wegovy Lost 8lbs in last 10lbs in last 3months Has maintain heart healthy diet, small meal portion and daily exercise Wt Readings from Last 3 Encounters:  09/27/22 202 lb 9.6 oz (91.9 kg)  08/09/22 209 lb 12.8 oz (95.2 kg)  07/21/22 212 lb (96.2 kg)    Maintain med dose, refill sent.       Relevant Medications   Semaglutide-Weight Management (WEGOVY) 2.4 MG/0.75ML SOAJ   Vitamin D deficiency    Completed high dose x 12weeks Repeat vit D today      Relevant Orders   VITAMIN D 25 Hydroxy (Vit-D Deficiency, Fractures)   Return in about 3 months (around 12/28/2022) for Weight management.     Alysia Penna, NP

## 2022-09-27 NOTE — Patient Instructions (Signed)
Keep up the good work Maintain current medications Go to lab

## 2022-09-27 NOTE — Assessment & Plan Note (Signed)
Resolved constipation and right groin pain No other adverse effect with wegovy Lost 8lbs in last 10lbs in last 3months Has maintain heart healthy diet, small meal portion and daily exercise Wt Readings from Last 3 Encounters:  09/27/22 202 lb 9.6 oz (91.9 kg)  08/09/22 209 lb 12.8 oz (95.2 kg)  07/21/22 212 lb (96.2 kg)    Maintain med dose, refill sent.

## 2022-09-27 NOTE — Assessment & Plan Note (Signed)
Completed high dose x 12weeks Repeat vit D today

## 2022-09-27 NOTE — Assessment & Plan Note (Signed)
Repeat lipid panel ?

## 2022-09-28 LAB — IRON,TIBC AND FERRITIN PANEL
%SAT: 6 % (calc) — ABNORMAL LOW (ref 16–45)
Ferritin: 3 ng/mL — ABNORMAL LOW (ref 16–232)
Iron: 25 ug/dL — ABNORMAL LOW (ref 40–190)
TIBC: 423 mcg/dL (calc) (ref 250–450)

## 2022-10-07 ENCOUNTER — Other Ambulatory Visit: Payer: Self-pay

## 2022-10-07 ENCOUNTER — Other Ambulatory Visit (HOSPITAL_BASED_OUTPATIENT_CLINIC_OR_DEPARTMENT_OTHER): Payer: Self-pay

## 2022-10-30 DIAGNOSIS — H8122 Vestibular neuronitis, left ear: Secondary | ICD-10-CM | POA: Diagnosis not present

## 2022-10-30 DIAGNOSIS — H6993 Unspecified Eustachian tube disorder, bilateral: Secondary | ICD-10-CM | POA: Diagnosis not present

## 2022-11-21 DIAGNOSIS — N920 Excessive and frequent menstruation with regular cycle: Secondary | ICD-10-CM | POA: Diagnosis not present

## 2022-11-21 DIAGNOSIS — Z01419 Encounter for gynecological examination (general) (routine) without abnormal findings: Secondary | ICD-10-CM | POA: Diagnosis not present

## 2022-11-21 DIAGNOSIS — D500 Iron deficiency anemia secondary to blood loss (chronic): Secondary | ICD-10-CM | POA: Diagnosis not present

## 2022-11-23 ENCOUNTER — Encounter: Payer: Self-pay | Admitting: Nurse Practitioner

## 2022-11-30 ENCOUNTER — Encounter (INDEPENDENT_AMBULATORY_CARE_PROVIDER_SITE_OTHER): Payer: Self-pay

## 2022-12-05 ENCOUNTER — Other Ambulatory Visit (HOSPITAL_BASED_OUTPATIENT_CLINIC_OR_DEPARTMENT_OTHER): Payer: Self-pay

## 2022-12-06 ENCOUNTER — Other Ambulatory Visit (HOSPITAL_BASED_OUTPATIENT_CLINIC_OR_DEPARTMENT_OTHER): Payer: Self-pay

## 2022-12-06 ENCOUNTER — Encounter: Payer: Self-pay | Admitting: Nurse Practitioner

## 2022-12-07 ENCOUNTER — Other Ambulatory Visit (HOSPITAL_BASED_OUTPATIENT_CLINIC_OR_DEPARTMENT_OTHER): Payer: Self-pay

## 2022-12-07 ENCOUNTER — Telehealth: Payer: Self-pay

## 2022-12-07 NOTE — Telephone Encounter (Signed)
*  Primary  Pharmacy Patient Advocate Encounter  Received notification from [facility] that Prior Authorization for [facility] 2.4MG /0.75ML auto-injectors  has been APPROVED from 12/07/2022 to 12/07/2023   PA #/Case ID/Reference #: [id number]

## 2022-12-07 NOTE — Telephone Encounter (Signed)
PA has been APPROVED.

## 2022-12-09 ENCOUNTER — Other Ambulatory Visit (HOSPITAL_BASED_OUTPATIENT_CLINIC_OR_DEPARTMENT_OTHER): Payer: Self-pay

## 2022-12-12 ENCOUNTER — Other Ambulatory Visit (HOSPITAL_BASED_OUTPATIENT_CLINIC_OR_DEPARTMENT_OTHER): Payer: Self-pay

## 2022-12-22 DIAGNOSIS — U071 COVID-19: Secondary | ICD-10-CM | POA: Diagnosis not present

## 2022-12-27 ENCOUNTER — Other Ambulatory Visit (HOSPITAL_BASED_OUTPATIENT_CLINIC_OR_DEPARTMENT_OTHER): Payer: Self-pay

## 2022-12-27 ENCOUNTER — Encounter: Payer: Self-pay | Admitting: Nurse Practitioner

## 2022-12-27 ENCOUNTER — Ambulatory Visit: Payer: Federal, State, Local not specified - PPO | Admitting: Nurse Practitioner

## 2022-12-27 VITALS — BP 112/84 | HR 90 | Temp 98.4°F | Resp 16 | Ht 61.0 in | Wt 196.8 lb

## 2022-12-27 DIAGNOSIS — K219 Gastro-esophageal reflux disease without esophagitis: Secondary | ICD-10-CM | POA: Diagnosis not present

## 2022-12-27 DIAGNOSIS — Z6837 Body mass index (BMI) 37.0-37.9, adult: Secondary | ICD-10-CM | POA: Diagnosis not present

## 2022-12-27 DIAGNOSIS — D500 Iron deficiency anemia secondary to blood loss (chronic): Secondary | ICD-10-CM

## 2022-12-27 LAB — LIPID PANEL
Cholesterol: 175 mg/dL (ref 0–200)
HDL: 58.3 mg/dL (ref >39.00)
LDL Cholesterol: 105 mg/dL — ABNORMAL HIGH (ref 0–99)
NonHDL: 116.62
Total CHOL/HDL Ratio: 3
Triglycerides: 60 mg/dL (ref 0.0–149.0)
VLDL: 12 mg/dL (ref 0.0–40.0)

## 2022-12-27 LAB — COMPREHENSIVE METABOLIC PANEL
ALT: 7 U/L (ref 0–35)
AST: 13 U/L (ref 0–37)
Albumin: 4.2 g/dL (ref 3.5–5.2)
Alkaline Phosphatase: 49 U/L (ref 39–117)
BUN: 13 mg/dL (ref 6–23)
CO2: 28 mEq/L (ref 19–32)
Calcium: 9.4 mg/dL (ref 8.4–10.5)
Chloride: 99 mEq/L (ref 96–112)
Creatinine, Ser: 0.67 mg/dL (ref 0.40–1.20)
GFR: 108.47 mL/min (ref >60.00)
Glucose, Bld: 90 mg/dL (ref 70–99)
Potassium: 4.5 mEq/L (ref 3.5–5.1)
Sodium: 135 mEq/L (ref 135–145)
Total Bilirubin: 0.5 mg/dL (ref 0.2–1.2)
Total Protein: 7.4 g/dL (ref 6.0–8.3)

## 2022-12-27 MED ORDER — WEGOVY 2.4 MG/0.75ML ~~LOC~~ SOAJ
2.4000 mg | SUBCUTANEOUS | 0 refills | Status: DC
  Filled 2022-12-27 – 2023-01-03 (×2): qty 3, 28d supply, fill #0
  Filled 2023-01-30: qty 3, 28d supply, fill #1
  Filled 2023-02-25: qty 3, 28d supply, fill #2

## 2022-12-27 NOTE — Assessment & Plan Note (Addendum)
No other adverse effect with wegovy Lost 6lbs in last 10lbs in last 3months Total weight loss: 21lbs in 7months Has maintain heart healthy diet, small meal portion and daily exercise-walking Wt Readings from Last 3 Encounters:  12/27/22 196 lb 12.8 oz (89.3 kg)  09/27/22 202 lb 9.6 oz (91.9 kg)  08/09/22 209 lb 12.8 oz (95.2 kg)    Maintain med dose, refill sent. Check CMP and lipid panel

## 2022-12-27 NOTE — Patient Instructions (Signed)
Go to lab Maintain med dose Maintain baclofen and pantoprazole as prescribed

## 2022-12-27 NOTE — Assessment & Plan Note (Signed)
Anemia managed by [facility] Physician OBGYN-Dr. Gerald Leitz,  current use of lysteda and iron supplement.

## 2022-12-27 NOTE — Assessment & Plan Note (Signed)
Stopped pantoprazole 2weeks ago Fluttering sensation in chest and neck area returned.  Advised to resume and maintain med dose as prescribed. F/up with GI

## 2022-12-27 NOTE — Progress Notes (Signed)
Established Patient Visit  Patient: Kiara Hall   DOB: [birth year]   42 y.o. Female  MRN: [id number] Visit Date: 12/27/2022  Subjective:    Chief Complaint  Patient presents with   Weight Check    She is still having discomfort with chest and neck    HPI Gastroesophageal reflux disease Stopped pantoprazole 2weeks ago Fluttering sensation in chest and neck area returned.  Advised to resume and maintain med dose as prescribed. F/up with GI  Iron deficiency anemia Anemia managed by [facility] OBGYN-Dr. Gerald Leitz,  current use of lysteda and iron supplement.  Obesity No other adverse effect with wegovy Lost 6lbs in last 10lbs in last 3months Total weight loss: 21lbs in 7months Has maintain heart healthy diet, small meal portion and daily exercise-walking Wt Readings from Last 3 Encounters:  12/27/22 196 lb 12.8 oz (89.3 kg)  09/27/22 202 lb 9.6 oz (91.9 kg)  08/09/22 209 lb 12.8 oz (95.2 kg)    Maintain med dose, refill sent. Check CMP and lipid panel   Reviewed medical, surgical, and social history today  Medications: Outpatient Medications Prior to Visit  Medication Sig   acetaminophen (TYLENOL) 325 MG tablet Take 2 tablets (650 mg total) by mouth every 4 (four) hours as needed for mild pain (temperature > 101.5.).   azelastine (ASTELIN) 0.1 % nasal spray Place 1 spray into both nostrils 2 (two) times daily. Use in each nostril as directed   Baclofen 5 MG TABS Take 5 mg by mouth daily as needed (esophageal spasm).   Iron, Ferrous Sulfate, 325 (65 Fe) MG TABS Take 325 mg by mouth every other day.   pantoprazole (PROTONIX) 20 MG tablet Take 1 tablet (20 mg total) by mouth 2 (two) times daily before a meal.   tranexamic acid (LYSTEDA) 650 MG TABS tablet Take 650 mg by mouth daily.   [DISCONTINUED] Semaglutide-Weight Management (WEGOVY) 2.4 MG/0.75ML SOAJ Inject 2.4 mg into the skin once a week.   [DISCONTINUED] Vitamin D, Ergocalciferol,  (DRISDOL) 1.25 MG (50000 UNIT) CAPS capsule Take 1 capsule (50,000 Units total) by mouth every 7 (seven) days.   No facility-administered medications prior to visit.   Reviewed past medical and social history.   ROS per HPI above  Last hemoglobin A1c Lab Results  Component Value Date   HGBA1C 5.5 02/14/2022   Last thyroid functions Lab Results  Component Value Date   TSH 1.11 08/20/2021   T3TOTAL 139.9 11/06/2007   Last vitamin D Lab Results  Component Value Date   VD25OH 56.25 09/27/2022        Objective:  BP 112/84 (BP Location: Right Arm, Patient Position: Sitting, Cuff Size: Large)   Pulse 90   Temp 98.4 F (36.9 C) (Temporal)   Resp 16   Ht 5\' 1"  (1.549 m)   Wt 196 lb 12.8 oz (89.3 kg)   SpO2 99%   Breastfeeding No   BMI 37.19 kg/m      Physical Exam Cardiovascular:     Rate and Rhythm: Normal rate and regular rhythm.     Pulses: Normal pulses.     Heart sounds: Normal heart sounds.  Pulmonary:     Effort: Pulmonary effort is normal.     Breath sounds: Normal breath sounds.  Neurological:     Mental Status: She is alert and oriented to person, place, and time.     No results found for  any visits on 12/27/22.    Assessment & Plan:    Problem List Items Addressed This Visit     Gastroesophageal reflux disease    Stopped pantoprazole 2weeks ago Fluttering sensation in chest and neck area returned.  Advised to resume and maintain med dose as prescribed. F/up with GI      Iron deficiency anemia    Anemia managed by [facility] OBGYN-Dr. Gerald Leitz,  current use of lysteda and iron supplement.      Obesity - Primary    No other adverse effect with wegovy Lost 6lbs in last 10lbs in last 3months Total weight loss: 21lbs in 7months Has maintain heart healthy diet, small meal portion and daily exercise-walking Wt Readings from Last 3 Encounters:  12/27/22 196 lb 12.8 oz (89.3 kg)  09/27/22 202 lb 9.6 oz (91.9 kg)  08/09/22 209 lb  12.8 oz (95.2 kg)    Maintain med dose, refill sent. Check CMP and lipid panel      Relevant Medications   Semaglutide-Weight Management (WEGOVY) 2.4 MG/0.75ML SOAJ   Other Relevant Orders   Lipid panel   Comprehensive metabolic panel   Return in about 3 months (around 03/29/2023) for Weight management, hyperlipidemia (fasting).     Alysia Penna, NP

## 2023-01-03 ENCOUNTER — Other Ambulatory Visit (HOSPITAL_BASED_OUTPATIENT_CLINIC_OR_DEPARTMENT_OTHER): Payer: Self-pay

## 2023-01-03 ENCOUNTER — Other Ambulatory Visit: Payer: Self-pay

## 2023-01-31 ENCOUNTER — Other Ambulatory Visit (HOSPITAL_BASED_OUTPATIENT_CLINIC_OR_DEPARTMENT_OTHER): Payer: Self-pay

## 2023-03-06 ENCOUNTER — Other Ambulatory Visit: Payer: Self-pay | Admitting: Nurse Practitioner

## 2023-03-06 DIAGNOSIS — Z1231 Encounter for screening mammogram for malignant neoplasm of breast: Secondary | ICD-10-CM

## 2023-03-23 ENCOUNTER — Ambulatory Visit: Payer: Federal, State, Local not specified - PPO

## 2023-03-28 ENCOUNTER — Ambulatory Visit: Payer: Federal, State, Local not specified - PPO | Admitting: Nurse Practitioner

## 2023-03-28 ENCOUNTER — Telehealth: Payer: Self-pay | Admitting: Internal Medicine

## 2023-03-28 ENCOUNTER — Other Ambulatory Visit: Payer: Self-pay | Admitting: Nurse Practitioner

## 2023-03-28 ENCOUNTER — Other Ambulatory Visit (HOSPITAL_COMMUNITY): Payer: Self-pay

## 2023-03-28 ENCOUNTER — Encounter: Payer: Self-pay | Admitting: Nurse Practitioner

## 2023-03-28 ENCOUNTER — Other Ambulatory Visit (HOSPITAL_BASED_OUTPATIENT_CLINIC_OR_DEPARTMENT_OTHER): Payer: Self-pay

## 2023-03-28 ENCOUNTER — Telehealth: Payer: Self-pay

## 2023-03-28 ENCOUNTER — Other Ambulatory Visit: Payer: Self-pay

## 2023-03-28 VITALS — BP 121/71 | HR 88 | Temp 98.9°F | Resp 18 | Ht 62.0 in | Wt 189.4 lb

## 2023-03-28 DIAGNOSIS — E66812 Obesity, class 2: Secondary | ICD-10-CM

## 2023-03-28 DIAGNOSIS — Z6834 Body mass index (BMI) 34.0-34.9, adult: Secondary | ICD-10-CM

## 2023-03-28 DIAGNOSIS — E66811 Obesity, class 1: Secondary | ICD-10-CM

## 2023-03-28 DIAGNOSIS — E7800 Pure hypercholesterolemia, unspecified: Secondary | ICD-10-CM | POA: Diagnosis not present

## 2023-03-28 DIAGNOSIS — Z6837 Body mass index (BMI) 37.0-37.9, adult: Secondary | ICD-10-CM

## 2023-03-28 DIAGNOSIS — D500 Iron deficiency anemia secondary to blood loss (chronic): Secondary | ICD-10-CM

## 2023-03-28 DIAGNOSIS — E6609 Other obesity due to excess calories: Secondary | ICD-10-CM

## 2023-03-28 LAB — LIPID PANEL
Cholesterol: 166 mg/dL (ref 0–200)
HDL: 50.6 mg/dL (ref >39.00)
LDL Cholesterol: 106 mg/dL — ABNORMAL HIGH (ref 0–99)
NonHDL: 115.01
Total CHOL/HDL Ratio: 3
Triglycerides: 47 mg/dL (ref 0.0–149.0)
VLDL: 9.4 mg/dL (ref 0.0–40.0)

## 2023-03-28 LAB — BASIC METABOLIC PANEL
BUN: 12 mg/dL (ref 6–23)
CO2: 28 meq/L (ref 19–32)
Calcium: 9.2 mg/dL (ref 8.4–10.5)
Chloride: 104 meq/L (ref 96–112)
Creatinine, Ser: 0.62 mg/dL (ref 0.40–1.20)
GFR: 110.32 mL/min (ref >60.00)
Glucose, Bld: 85 mg/dL (ref 70–99)
Potassium: 4.2 meq/L (ref 3.5–5.1)
Sodium: 137 meq/L (ref 135–145)

## 2023-03-28 LAB — CBC
HCT: 25.1 % — ABNORMAL LOW (ref 36.0–46.0)
Hemoglobin: 6.7 g/dL — CL (ref 12.0–15.0)
MCHC: 26.8 g/dL — ABNORMAL LOW (ref 30.0–36.0)
MCV: 53.3 fL — ABNORMAL LOW (ref 78.0–100.0)
Platelets: 307 10*3/uL (ref 150.0–400.0)
RBC: 4.7 Mil/uL (ref 3.87–5.11)
RDW: 30.1 % — ABNORMAL HIGH (ref 11.5–15.5)
WBC: 3.7 10*3/uL — ABNORMAL LOW (ref 4.0–10.5)

## 2023-03-28 LAB — HEMOGLOBIN A1C: Hgb A1c MFr Bld: 5.2 % (ref 4.6–6.5)

## 2023-03-28 MED ORDER — WEGOVY 2.4 MG/0.75ML ~~LOC~~ SOAJ: 2.4000 mg | SUBCUTANEOUS | 0 refills | Status: DC

## 2023-03-28 MED ORDER — WEGOVY 2.4 MG/0.75ML ~~LOC~~ SOAJ
2.4000 mg | SUBCUTANEOUS | 0 refills | Status: DC
  Filled 2023-03-28: qty 9, 84d supply, fill #0
  Filled 2023-03-28: qty 3, 28d supply, fill #0
  Filled 2023-04-28: qty 3, 28d supply, fill #1

## 2023-03-28 NOTE — Telephone Encounter (Signed)
Received a call from [facility] for critical lab value - hgb of 6.7.   She has chronic anemia due to heavy menses - this is much lower than normal.  She is currently menstruating.  She sees Gyn - on lysteda.    Asymptomatic -she denies any shortness of breath, palpitations, chest pain or lightheadedness.  She was actually thinking the value might be up a little bit.  Discussed possible ED visit and blood transfusion.  Because this is chronic and she is asymptomatic I think we can hold off until tomorrow.  She has been putting off making appointment for hematology.  She does not tolerate oral iron and would benefit from iron infusions.  Stressed that she needs to make that appointment.  Discussed PCP office will reach out to her-possible repeat blood counts next week.  Discussed the seriousness of the low blood counts

## 2023-03-28 NOTE — Assessment & Plan Note (Signed)
Repeat lipid panel Encouraged to maintain a heart healthy diet

## 2023-03-28 NOTE — Assessment & Plan Note (Signed)
Lost 5lbs in last 3months Total weight loss in 10months: 26lbs Denies any adverse effects with wegovy 2.4mg  weekly Exercise: walking 2.105miles daily Diet: low carb and low fat, unknown daily caloric intake Wt Readings from Last 3 Encounters:  03/28/23 189 lb 6.4 oz (85.9 kg)  12/27/22 196 lb 12.8 oz (89.3 kg)  09/27/22 202 lb 9.6 oz (91.9 kg)    She agreed to referral to weight management clinic Encourage to use myfitnesspal app to count daily caloric intake. Encourage to add resistance or weight training exercise Maintain med dose Repeat BMP F/up in 3-34months

## 2023-03-28 NOTE — Assessment & Plan Note (Signed)
Repeat CBC and iron panel Current use of lysteda during menstrual cycle to limit vaginal bleeding

## 2023-03-28 NOTE — Progress Notes (Signed)
Established Patient Visit  Patient: Kiara Hall   DOB: 1980-11-23   42 y.o. Female  MRN: [id number] Visit Date: 03/29/2023  Subjective:    Chief Complaint  Patient presents with   OFFICE VISIT    3 month follow up weight management    Hyperlipidemia   Hyperlipidemia   Hypercholesterolemia Repeat lipid panel Encouraged to maintain a heart healthy diet  Iron deficiency anemia Repeat CBC and iron panel Current use of lysteda during menstrual cycle to limit vaginal bleeding  Obesity Lost 5lbs in last 3months Total weight loss in 10months: 26lbs Denies any adverse effects with wegovy 2.4mg  weekly Exercise: walking 2.18miles daily Diet: low carb and low fat, unknown daily caloric intake Wt Readings from Last 3 Encounters:  03/28/23 189 lb 6.4 oz (85.9 kg)  12/27/22 196 lb 12.8 oz (89.3 kg)  09/27/22 202 lb 9.6 oz (91.9 kg)    She agreed to referral to weight management clinic Encourage to use myfitnesspal app to count daily caloric intake. Encourage to add resistance or weight training exercise Maintain med dose Repeat BMP F/up in 3-100months  Wt Readings from Last 3 Encounters:  03/28/23 189 lb 6.4 oz (85.9 kg)  12/27/22 196 lb 12.8 oz (89.3 kg)  09/27/22 202 lb 9.6 oz (91.9 kg)    Reviewed medical, surgical, and social history today  Medications: Outpatient Medications Prior to Visit  Medication Sig   acetaminophen (TYLENOL) 325 MG tablet Take 2 tablets (650 mg total) by mouth every 4 (four) hours as needed for mild pain (temperature > 101.5.).   Baclofen 5 MG TABS Take 5 mg by mouth daily as needed (esophageal spasm).   Iron, Ferrous Sulfate, 325 (65 Fe) MG TABS Take 325 mg by mouth every other day.   pantoprazole (PROTONIX) 20 MG tablet Take 1 tablet (20 mg total) by mouth 2 (two) times daily before a meal.   tranexamic acid (LYSTEDA) 650 MG TABS tablet Take 650 mg by mouth daily.   [DISCONTINUED] Semaglutide-Weight Management (WEGOVY) 2.4  MG/0.75ML SOAJ Inject 2.4 mg into the skin once a week.   [DISCONTINUED] albuterol (VENTOLIN HFA) 108 (90 Base) MCG/ACT inhaler Inhale into the lungs. (Patient not taking: Reported on 03/28/2023)   [DISCONTINUED] azelastine (ASTELIN) 0.1 % nasal spray Place 1 spray into both nostrils 2 (two) times daily. Use in each nostril as directed (Patient not taking: Reported on 03/28/2023)   No facility-administered medications prior to visit.   Reviewed past medical and social history.   ROS per HPI above      Objective:  BP 121/71 (BP Location: Left Arm, Patient Position: Sitting, Cuff Size: Large)   Pulse 88   Temp 98.9 F (37.2 C) (Temporal)   Resp 18   Ht 5\' 2"  (1.575 m)   Wt 189 lb 6.4 oz (85.9 kg)   LMP 03/24/2023 (Exact Date)   SpO2 100%   Breastfeeding No   BMI 34.64 kg/m      Physical Exam Vitals and nursing note reviewed.  Cardiovascular:     Rate and Rhythm: Normal rate and regular rhythm.     Pulses: Normal pulses.     Heart sounds: Normal heart sounds.  Pulmonary:     Effort: Pulmonary effort is normal.     Breath sounds: Normal breath sounds.  Neurological:     Mental Status: She is alert and oriented to person, place, and time.  Psychiatric:  Mood and Affect: Mood normal.        Behavior: Behavior normal.        Thought Content: Thought content normal.     Results for orders placed or performed in visit on 03/28/23  Iron, TIBC and Ferritin Panel  Result Value Ref Range   Iron 13 (L) 40 - 190 mcg/dL   TIBC 161 096 - 045 mcg/dL (calc)   %SAT 3 (L) 16 - 45 % (calc)   Ferritin 3 (L) 16 - 232 ng/mL  CBC  Result Value Ref Range   WBC 3.7 (L) 4.0 - 10.5 K/uL   RBC 4.70 3.87 - 5.11 Mil/uL   Platelets 307.0 150.0 - 400.0 K/uL   Hemoglobin 6.7 Repeated and verified X2. (LL) 12.0 - 15.0 g/dL   HCT 40.9 (L) 81.1 - 91.4 %   MCV 53.3 (L) 78.0 - 100.0 fl   MCHC 26.8 (L) 30.0 - 36.0 g/dL   RDW 78.2 (H) 95.6 - 21.3 %  Lipid panel  Result Value Ref Range    Cholesterol 166 0 - 200 mg/dL   Triglycerides 08.6 0.0 - 149.0 mg/dL   HDL 57.84 >69.62 mg/dL   VLDL 9.4 0.0 - 95.2 mg/dL   LDL Cholesterol 841 (H) 0 - 99 mg/dL   Total CHOL/HDL Ratio 3    NonHDL 115.01   Basic metabolic panel  Result Value Ref Range   Sodium 137 135 - 145 mEq/L   Potassium 4.2 3.5 - 5.1 mEq/L   Chloride 104 96 - 112 mEq/L   CO2 28 19 - 32 mEq/L   Glucose, Bld 85 70 - 99 mg/dL   BUN 12 6 - 23 mg/dL   Creatinine, Ser 3.24 0.40 - 1.20 mg/dL   GFR 401.02 >72.53 mL/min   Calcium 9.2 8.4 - 10.5 mg/dL  Hemoglobin G6Y  Result Value Ref Range   Hgb A1c MFr Bld 5.2 4.6 - 6.5 %      Assessment & Plan:    Problem List Items Addressed This Visit     Hypercholesterolemia - Primary    Repeat lipid panel Encouraged to maintain a heart healthy diet      Relevant Orders   Lipid panel (Completed)   Iron deficiency anemia    Repeat CBC and iron panel Current use of lysteda during menstrual cycle to limit vaginal bleeding      Relevant Orders   Iron, TIBC and Ferritin Panel (Completed)   CBC (Completed)   Ambulatory referral to Hematology / Oncology   Obesity    Lost 5lbs in last 3months Total weight loss in 10months: 26lbs Denies any adverse effects with wegovy 2.4mg  weekly Exercise: walking 2.81miles daily Diet: low carb and low fat, unknown daily caloric intake Wt Readings from Last 3 Encounters:  03/28/23 189 lb 6.4 oz (85.9 kg)  12/27/22 196 lb 12.8 oz (89.3 kg)  09/27/22 202 lb 9.6 oz (91.9 kg)    She agreed to referral to weight management clinic Encourage to use myfitnesspal app to count daily caloric intake. Encourage to add resistance or weight training exercise Maintain med dose Repeat BMP F/up in 3-39months      Relevant Orders   Amb Ref to Medical Weight Management   Basic metabolic panel (Completed)   Hemoglobin A1c (Completed)   Return in about 6 months (around 09/25/2023) for CPE (fasting).     Alysia Penna, NP

## 2023-03-28 NOTE — Telephone Encounter (Signed)
Pt Wegovy was sent to wrong Pharmacy. Rx was resent to [facility]. [facility] has been deleted from pt pharmacies.  Pt got a 3 month supply on last fill and is not due until 12/28 in order for insurance to pay for it.

## 2023-03-28 NOTE — Patient Instructions (Signed)
Go to lab Continue Heart healthy diet and daily exercise. Maintain current medications. 

## 2023-03-29 LAB — IRON,TIBC AND FERRITIN PANEL
%SAT: 3 % — ABNORMAL LOW (ref 16–45)
Ferritin: 3 ng/mL — ABNORMAL LOW (ref 16–232)
Iron: 13 ug/dL — ABNORMAL LOW (ref 40–190)
TIBC: 423 ug/dL (ref 250–450)

## 2023-03-29 NOTE — Addendum Note (Signed)
Addended by: Alysia Penna L on: 03/29/2023 12:02 PM   Modules accepted: Orders

## 2023-04-03 ENCOUNTER — Other Ambulatory Visit (HOSPITAL_COMMUNITY): Payer: Self-pay

## 2023-04-07 ENCOUNTER — Other Ambulatory Visit (HOSPITAL_COMMUNITY): Payer: Self-pay

## 2023-04-10 ENCOUNTER — Other Ambulatory Visit: Payer: Federal, State, Local not specified - PPO

## 2023-04-10 ENCOUNTER — Inpatient Hospital Stay: Payer: Federal, State, Local not specified - PPO | Admitting: Oncology

## 2023-05-01 ENCOUNTER — Encounter: Payer: Self-pay | Admitting: Nurse Practitioner

## 2023-05-01 DIAGNOSIS — E6609 Other obesity due to excess calories: Secondary | ICD-10-CM

## 2023-05-02 ENCOUNTER — Other Ambulatory Visit (HOSPITAL_COMMUNITY): Payer: Self-pay

## 2023-05-02 ENCOUNTER — Other Ambulatory Visit (HOSPITAL_BASED_OUTPATIENT_CLINIC_OR_DEPARTMENT_OTHER): Payer: Self-pay

## 2023-05-02 ENCOUNTER — Telehealth: Payer: Self-pay

## 2023-05-02 ENCOUNTER — Inpatient Hospital Stay: Payer: Federal, State, Local not specified - PPO | Attending: Oncology | Admitting: Oncology

## 2023-05-02 ENCOUNTER — Other Ambulatory Visit: Payer: Self-pay

## 2023-05-02 ENCOUNTER — Inpatient Hospital Stay: Payer: Federal, State, Local not specified - PPO

## 2023-05-02 VITALS — BP 141/72 | HR 100 | Temp 98.2°F | Resp 18 | Ht 62.0 in | Wt 187.8 lb

## 2023-05-02 DIAGNOSIS — D500 Iron deficiency anemia secondary to blood loss (chronic): Secondary | ICD-10-CM | POA: Diagnosis not present

## 2023-05-02 DIAGNOSIS — N920 Excessive and frequent menstruation with regular cycle: Secondary | ICD-10-CM

## 2023-05-02 LAB — CBC WITH DIFFERENTIAL/PLATELET
Abs Immature Granulocytes: 0.01 10*3/uL (ref 0.00–0.07)
Basophils Absolute: 0 10*3/uL (ref 0.0–0.1)
Basophils Relative: 1 %
Eosinophils Absolute: 0.1 10*3/uL (ref 0.0–0.5)
Eosinophils Relative: 2 %
HCT: 33.2 % — ABNORMAL LOW (ref 36.0–46.0)
Hemoglobin: 9.6 g/dL — ABNORMAL LOW (ref 12.0–15.0)
Immature Granulocytes: 0 %
Lymphocytes Relative: 37 %
Lymphs Abs: 1.5 10*3/uL (ref 0.7–4.0)
MCH: 18.2 pg — ABNORMAL LOW (ref 26.0–34.0)
MCHC: 28.9 g/dL — ABNORMAL LOW (ref 30.0–36.0)
MCV: 62.9 fL — ABNORMAL LOW (ref 80.0–100.0)
Monocytes Absolute: 0.5 10*3/uL (ref 0.1–1.0)
Monocytes Relative: 13 %
Neutro Abs: 2 10*3/uL (ref 1.7–7.7)
Neutrophils Relative %: 47 %
Platelets: 309 10*3/uL (ref 150–400)
RBC: 5.28 MIL/uL — ABNORMAL HIGH (ref 3.87–5.11)
RDW: 31.3 % — ABNORMAL HIGH (ref 11.5–15.5)
WBC: 4.1 10*3/uL (ref 4.0–10.5)
nRBC: 0 % (ref 0.0–0.2)

## 2023-05-02 LAB — COMPREHENSIVE METABOLIC PANEL
ALT: 7 U/L (ref 0–44)
AST: 13 U/L — ABNORMAL LOW (ref 15–41)
Albumin: 4.4 g/dL (ref 3.5–5.0)
Alkaline Phosphatase: 50 U/L (ref 38–126)
Anion gap: 7 (ref 5–15)
BUN: 12 mg/dL (ref 6–20)
CO2: 28 mmol/L (ref 22–32)
Calcium: 9.6 mg/dL (ref 8.9–10.3)
Chloride: 102 mmol/L (ref 98–111)
Creatinine, Ser: 0.62 mg/dL (ref 0.44–1.00)
GFR, Estimated: >60 mL/min (ref >60)
Glucose, Bld: 84 mg/dL (ref 70–99)
Potassium: 4.5 mmol/L (ref 3.5–5.1)
Sodium: 137 mmol/L (ref 135–145)
Total Bilirubin: 0.5 mg/dL (ref 0.0–1.2)
Total Protein: 7.7 g/dL (ref 6.5–8.1)

## 2023-05-02 LAB — FERRITIN: Ferritin: 5 ng/mL — ABNORMAL LOW (ref 11–307)

## 2023-05-02 LAB — IRON AND TIBC
Iron: 78 ug/dL (ref 28–170)
Saturation Ratios: 15 % (ref 10.4–31.8)
TIBC: 514 ug/dL — ABNORMAL HIGH (ref 250–450)
UIBC: 436 ug/dL

## 2023-05-02 MED ORDER — FERROUS FUMARATE 324 MG PO TABS
1.0000 | ORAL_TABLET | Freq: Every day | ORAL | 5 refills | Status: DC
  Filled 2023-05-02: qty 30, 30d supply, fill #0
  Filled 2023-05-29: qty 30, 30d supply, fill #1
  Filled 2023-08-08: qty 30, 30d supply, fill #2
  Filled 2023-09-04: qty 30, 30d supply, fill #3
  Filled 2024-01-12: qty 30, 30d supply, fill #4
  Filled 2024-03-11 – 2024-04-15 (×2): qty 30, 30d supply, fill #5

## 2023-05-02 MED ORDER — TIRZEPATIDE-WEIGHT MANAGEMENT 5 MG/0.5ML ~~LOC~~ SOAJ
SUBCUTANEOUS | 0 refills | Status: DC
  Filled 2023-05-02: qty 2, 28d supply, fill #0

## 2023-05-02 MED ORDER — ZEPBOUND 5 MG/0.5ML ~~LOC~~ SOAJ
5.0000 mg | SUBCUTANEOUS | 0 refills | Status: DC
  Filled 2023-05-02: qty 2, 28d supply, fill #0

## 2023-05-02 NOTE — Assessment & Plan Note (Addendum)
 She requested to Switched wegovy  2.4 to zepbound  5mg  due to minimal results with wegovy . After PA completed, she is unable to afford zepbound . She opted to maintain wegovy  2.4mg  at this time. Pending appointment with weight management clinic. F/up in 3months

## 2023-05-02 NOTE — Telephone Encounter (Signed)
 Pharmacy Patient Advocate Encounter  Received notification from [facility] that Prior Authorization for ZEPBOUND  5MG  has been APPROVED from 04/02/23 to 10/29/23. Ran test claim, Copay is $455.95. This test claim was processed through [facility]- copay amounts may vary at other pharmacies due to pharmacy/plan contracts, or as the patient moves through the different stages of their insurance plan.   PA #/Case ID/Reference #:  [id number]

## 2023-05-02 NOTE — Telephone Encounter (Signed)
 Pharmacy Patient Advocate Encounter   Received notification from CoverMyMeds that prior authorization for ZEPBOUND  5MG  is required/requested.   Insurance verification completed.   The patient is insured through [facility] .   Per test claim: PA required; PA submitted to above mentioned insurance via CoverMyMeds Key/confirmation #/EOC [facility] Status is pending

## 2023-05-02 NOTE — Progress Notes (Signed)
 [facility]  HEMATOLOGY CLINIC CONSULTATION NOTE   PATIENT NAME: Kiara Hall   MR#: [id number] DOB: [birth year]  DATE OF SERVICE: 05/02/2023  Patient Care Team: Nche, Roselie Rockford, NP as PCP - General (Internal Medicine) Pietro Redell RAMAN, MD as PCP - Cardiology (Cardiology) Rosalva Sawyer, MD as Consulting Physician (Obstetrics and Gynecology)  REASON FOR CONSULTATION/ CHIEF COMPLAINT:  Evaluation of anemia.  ASSESSMENT & PLAN:   Kiara Hall is a 42 y.o. lady with a past medical history of menorrhagia, dyslipidemia, obesity, was referred to our service for evaluation of iron  deficiency anemia.    Iron  deficiency anemia due to chronic blood loss Chronic issue with poor tolerance to oral iron  supplementation due to side effects including dizziness. Patient has been more compliant with iron  supplementation recently. Menorrhagia likely contributing to iron  deficiency.  -Labs today showed improved hemoglobin of 9.6, MCV still low at 62.9.  White count and platelet count are currently normal.  Iron  studies indicate ongoing iron  deficiency with ferritin of 5.  Rest of the iron  parameters are improved with iron  saturation of 15%, iron  normal at 78.  -Patient is worried about side effects from IV iron  including chance of infusion reaction.  She would prefer to remain on oral iron  supplements but was asking for an alternative oral iron  formulation for better tolerance.  -Prescribed Ferrous Fumarate  to patient's pharmacy. If not covered by insurance, patient to continue with liquid iron  supplement, ferrous gluconate, which she tolerated well previously.  -We will consider IV iron  if hemoglobin remains critically low (<7) or if oral iron  supplementation continues to be poorly tolerated.  -Follow-up in two months to reassess iron  levels and response to treatment.   Menorrhagia Heavy menstrual cycles since discontinuation of breastfeeding and tubal ligation. Patient  has been prescribed Tranexamic acid  but has not been consistently taking it. -Encourage patient to take Tranexamic acid  during heavy days of menstrual cycle to reduce blood loss.   Since the cause of anemia seems to be obvious from iron  deficiency, I am not pursuing extensive workup at this time.  If inadequate response to IV iron  is noted, we will pursue workup to rule out other etiologies.  I reviewed lab results and outside records for this visit and discussed relevant results with the patient. Diagnosis, plan of care and treatment options were also discussed in detail with the patient. Opportunity provided to ask questions and answers provided to her apparent satisfaction. Provided instructions to call our clinic with any problems, questions or concerns prior to return visit. I recommended to continue follow-up with PCP and sub-specialists. She verbalized understanding and agreed with the plan. No barriers to learning was detected.  Kiara Hoerner, MD [facility] - A DEPT OF [facility] [id number] [place] [address] Dept: [phone] Dept Fax: [phone]  05/02/2023 2:08 PM  HISTORY OF PRESENT ILLNESS:  Discussed the use of AI scribe software for clinical note transcription with the patient, who gave verbal consent to proceed.   On 03/28/2023, labs at her PCPs office showed severe anemia with hemoglobin of 6.7, hematocrit 25.1, MCV 53.3.  White count was 3700.  No differential was obtained.  Platelet count was normal at 307,000.  Iron  studies showed severe iron  deficiency with ferritin of 3, iron  saturation of 3%, iron  decreased at 13.  Patient did not tolerate oral iron  supplementation.  She was referred to us  for further evaluation and management of  iron  deficiency anemia.  She reports ongoing struggles with oral iron  supplementation. She reports experiencing side effects such as constipation, heartburn,  nausea, and dizziness, which have made consistent use of the supplements challenging. The patient also notes a sensation of dizziness when taking a 65mg  iron  supplement.  The patient's iron  deficiency has been a recurring issue, with previous episodes of low iron  levels. However, she reports that when she is able to consistently take the iron  supplements, her levels do improve.  In addition to the iron  deficiency, the patient has experienced changes in her menstrual cycle since having her tubes removed and stopping breastfeeding. She reports heavier cycles, which she believes may be contributing to her iron  deficiency. She has been prescribed medication to take during her cycle but admits to not always taking it as directed.  The patient also mentions a craving for chewing on kosher salt, a symptom often associated with iron  deficiency. She expresses a commitment to improving her health and managing her iron  levels more effectively.  She had not noticed any other recent bleeding such as epistaxis, hematuria or hematochezia.   MEDICAL HISTORY:  Past Medical History:  Diagnosis Date   Anemia    Anxiety    Chicken pox    Complication of anesthesia    hypotension with one of the spinal   ELEVATED BP READING WITHOUT DX HYPERTENSION 12/17/2007   Qualifier: Diagnosis of  By: Tanda MD, Jay     GERD (gastroesophageal reflux disease)    Palpitation     SURGICAL HISTORY: Past Surgical History:  Procedure Laterality Date   BREAST BIOPSY Left    benign    CESAREAN SECTION N/A 02/09/2015   Procedure: CESAREAN SECTION;  Surgeon: Rexene Hoit, MD;  Location: [facility];  Service: Obstetrics;  Laterality: N/A;   CESAREAN SECTION N/A 02/05/2019   Procedure: CESAREAN SECTION;  Surgeon: Hoit Rexene, MD;  Location: [facility] ORS;  Service: Obstetrics;  Laterality: N/A;   CESAREAN SECTION Bilateral 04/22/2021   Procedure: REPEAT CESAREAN SECTION WITH BILATERAL SALPINGECTOMY;  Surgeon: Hoit Rexene, MD;   Location: [facility] ORS;  Service: Obstetrics;  Laterality: Bilateral;   TONSILLECTOMY      SOCIAL HISTORY: She reports that she has never smoked. She has never used smokeless tobacco. She reports current alcohol use. She reports that she does not use drugs. Social History   Socioeconomic History   Marital status: Married    Spouse name: Not on file   Number of children: 3   Years of education: Not on file   Highest education level: Not on file  Occupational History   Not on file  Tobacco Use   Smoking status: Never   Smokeless tobacco: Never  Vaping Use   Vaping status: Never Used  Substance and Sexual Activity   Alcohol use: Yes    Comment: Occasional   Drug use: No   Sexual activity: Yes    Birth control/protection: Condom  Other Topics Concern   Not on file  Social History Narrative   Not on file   Social Drivers of Health   Financial Resource Strain: Not on file  Food Insecurity: No Food Insecurity (03/22/2022)   Received from [facility], [facility]   Hunger Vital Sign    Worried About Running Out of Food in the Last Year: Never true    Ran Out of Food in the Last Year: Never true  Transportation Needs: Not on file  Physical Activity: Not on file  Stress: Not on  file  Social Connections: Unknown (02/21/2022)   Received from [facility], [facility]   Social Network    Social Network: Not on file  Intimate Partner Violence: Unknown (02/21/2022)   Received from [facility], [facility]   HITS    Physically Hurt: Not on file    Insult or Talk Down To: Not on file    Threaten Physical Harm: Not on file    Scream or Curse: Not on file    FAMILY HISTORY: Family History  Problem Relation Age of Onset   Diabetes Mother    Hypertension Mother    Heart disease Maternal Uncle 77   Hypertension Maternal Grandfather    Miscarriages / Stillbirths Sister    Hypertension Maternal Aunt    Stroke Maternal Grandmother     ALLERGIES:  She is allergic to  metronidazole.  MEDICATIONS:  Current Outpatient Medications  Medication Sig Dispense Refill   Iron , Ferrous Sulfate , 325 (65 Fe) MG TABS Take 325 mg by mouth every other day. 30 tablet    pantoprazole  (PROTONIX ) 20 MG tablet Take 1 tablet (20 mg total) by mouth 2 (two) times daily before a meal. 60 tablet 0   Semaglutide -Weight Management 2.4 MG/0.75ML SOAJ Inject 2.4 mg into the skin. Inject into skin once a week     acetaminophen  (TYLENOL ) 325 MG tablet Take 2 tablets (650 mg total) by mouth every 4 (four) hours as needed for mild pain (temperature > 101.5.). (Patient not taking: Reported on 05/02/2023)     Baclofen 5 MG TABS Take 5 mg by mouth daily as needed (esophageal spasm). (Patient not taking: Reported on 05/02/2023)     tirzepatide  (ZEPBOUND ) 5 MG/0.5ML Pen Inject 5 mg into the skin once a week. (Patient not taking: Reported on 05/02/2023) 2 mL 0   tranexamic acid  (LYSTEDA ) 650 MG TABS tablet Take 650 mg by mouth daily. (Patient not taking: Reported on 05/02/2023)     No current facility-administered medications for this visit.    REVIEW OF SYSTEMS:    Review of Systems - Oncology  All other pertinent systems were reviewed and were negative except as mentioned above.  PHYSICAL EXAMINATION:  ECOG PERFORMANCE STATUS: 1 - Symptomatic but completely ambulatory  Vitals:   05/02/23 1358 05/02/23 1359  BP: (!) 146/72 (!) 141/72  Pulse: 100   Resp: 18   Temp: 98.2 F (36.8 C)   SpO2: 100%    Filed Weights   05/02/23 1358  Weight: 187 lb 12.8 oz (85.2 kg)    Physical Exam Constitutional:      General: She is not in acute distress.    Appearance: Normal appearance.  HENT:     Head: Normocephalic and atraumatic.  Eyes:     General: No scleral icterus.    Conjunctiva/sclera: Conjunctivae normal.  Cardiovascular:     Rate and Rhythm: Normal rate and regular rhythm.     Heart sounds: Normal heart sounds.  Pulmonary:     Effort: Pulmonary effort is normal.     Breath  sounds: Normal breath sounds.  Abdominal:     General: There is no distension.  Musculoskeletal:     Right lower leg: No edema.     Left lower leg: No edema.  Neurological:     General: No focal deficit present.     Mental Status: She is alert and oriented to person, place, and time.  Psychiatric:        Mood and Affect: Mood normal.  Behavior: Behavior normal.        Thought Content: Thought content normal.     LABORATORY DATA:   I have reviewed the data as listed.  Results for orders placed or performed in visit on 05/02/23  Ferritin  Result Value Ref Range   Ferritin 5 (L) 11 - 307 ng/mL  Iron  and TIBC  Result Value Ref Range   Iron  78 28 - 170 ug/dL   TIBC 485 (H) 749 - 549 ug/dL   Saturation Ratios 15 10.4 - 31.8 %   UIBC 436 ug/dL  CBC with Differential/Platelet  Result Value Ref Range   WBC 4.1 4.0 - 10.5 K/uL   RBC 5.28 (H) 3.87 - 5.11 MIL/uL   Hemoglobin 9.6 (L) 12.0 - 15.0 g/dL   HCT 66.7 (L) 63.9 - 53.9 %   MCV 62.9 (L) 80.0 - 100.0 fL   MCH 18.2 (L) 26.0 - 34.0 pg   MCHC 28.9 (L) 30.0 - 36.0 g/dL   RDW 68.6 (H) 88.4 - 84.4 %   Platelets 309 150 - 400 K/uL   nRBC 0.0 0.0 - 0.2 %   Neutrophils Relative % 47 %   Neutro Abs 2.0 1.7 - 7.7 K/uL   Lymphocytes Relative 37 %   Lymphs Abs 1.5 0.7 - 4.0 K/uL   Monocytes Relative 13 %   Monocytes Absolute 0.5 0.1 - 1.0 K/uL   Eosinophils Relative 2 %   Eosinophils Absolute 0.1 0.0 - 0.5 K/uL   Basophils Relative 1 %   Basophils Absolute 0.0 0.0 - 0.1 K/uL   Immature Granulocytes 0 %   Abs Immature Granulocytes 0.01 0.00 - 0.07 K/uL  Comprehensive metabolic panel  Result Value Ref Range   Sodium 137 135 - 145 mmol/L   Potassium 4.5 3.5 - 5.1 mmol/L   Chloride 102 98 - 111 mmol/L   CO2 28 22 - 32 mmol/L   Glucose, Bld 84 70 - 99 mg/dL   BUN 12 6 - 20 mg/dL   Creatinine, Ser 9.37 0.44 - 1.00 mg/dL   Calcium 9.6 8.9 - 89.6 mg/dL   Total Protein 7.7 6.5 - 8.1 g/dL   Albumin 4.4 3.5 - 5.0 g/dL   AST  13 (L) 15 - 41 U/L   ALT 7 0 - 44 U/L   Alkaline Phosphatase 50 38 - 126 U/L   Total Bilirubin 0.5 0.0 - 1.2 mg/dL   GFR, Estimated >39 >39 mL/min   Anion gap 7 5 - 15     RADIOGRAPHIC STUDIES:  No recent pertinent imaging studies available to review.  Orders Placed This Encounter  Procedures   Comprehensive metabolic panel    Standing Status:   Future    Number of Occurrences:   1    Expiration Date:   05/01/2024   CBC with Differential/Platelet    Standing Status:   Future    Number of Occurrences:   1    Expiration Date:   05/01/2024   Iron  and TIBC    Standing Status:   Future    Number of Occurrences:   1    Expiration Date:   05/01/2024   Ferritin    Standing Status:   Future    Number of Occurrences:   1    Expiration Date:   05/01/2024    Future Appointments  Date Time Provider Department Center  05/11/2023 12:00 PM Bowen, Darice BRAVO, DO [facility] None  07/03/2023  3:15 PM [facility] PHLEBOTOMIST [facility] None  07/03/2023  3:40 PM Laia Wiley, MD [facility] None     I spent a total of 55 minutes during this encounter with the patient including review of chart and various tests results, discussions about plan of care and coordination of care plan.  This document was completed utilizing speech recognition software. Grammatical errors, random word insertions, pronoun errors, and incomplete sentences are an occasional consequence of this system due to software limitations, ambient noise, and hardware issues. Any formal questions or concerns about the content, text or information contained within the body of this dictation should be directly addressed to the provider for clarification.

## 2023-05-04 ENCOUNTER — Encounter: Payer: Self-pay | Admitting: Oncology

## 2023-05-04 ENCOUNTER — Other Ambulatory Visit (HOSPITAL_BASED_OUTPATIENT_CLINIC_OR_DEPARTMENT_OTHER): Payer: Self-pay

## 2023-05-04 DIAGNOSIS — N920 Excessive and frequent menstruation with regular cycle: Secondary | ICD-10-CM | POA: Insufficient documentation

## 2023-05-04 NOTE — Assessment & Plan Note (Signed)
 Chronic issue with poor tolerance to oral iron  supplementation due to side effects including dizziness. Patient has been more compliant with iron  supplementation recently. Menorrhagia likely contributing to iron  deficiency.  -Labs today showed improved hemoglobin of 9.6, MCV still low at 62.9.  White count and platelet count are currently normal.  Iron  studies indicate ongoing iron  deficiency with ferritin of 5.  Rest of the iron  parameters are improved with iron  saturation of 15%, iron  normal at 78.  -Patient is worried about side effects from IV iron  including chance of infusion reaction.  She would prefer to remain on oral iron  supplements but was asking for an alternative oral iron  formulation for better tolerance.  -Prescribed Ferrous Fumarate  to patient's pharmacy. If not covered by insurance, patient to continue with liquid iron  supplement, ferrous gluconate, which she tolerated well previously.  -We will consider IV iron  if hemoglobin remains critically low (<7) or if oral iron  supplementation continues to be poorly tolerated.  -Follow-up in two months to reassess iron  levels and response to treatment.

## 2023-05-04 NOTE — Assessment & Plan Note (Signed)
 Heavy menstrual cycles since discontinuation of breastfeeding and tubal ligation. Patient has been prescribed Tranexamic acid  but has not been consistently taking it. -Encourage patient to take Tranexamic acid  during heavy days of menstrual cycle to reduce blood loss.

## 2023-05-05 ENCOUNTER — Other Ambulatory Visit (HOSPITAL_BASED_OUTPATIENT_CLINIC_OR_DEPARTMENT_OTHER): Payer: Self-pay

## 2023-05-05 MED ORDER — SEMAGLUTIDE-WEIGHT MANAGEMENT 2.4 MG/0.75ML ~~LOC~~ SOAJ
2.4000 mg | SUBCUTANEOUS | 2 refills | Status: DC
  Filled 2023-05-05 – 2023-05-24 (×2): qty 3, 28d supply, fill #0

## 2023-05-05 NOTE — Addendum Note (Signed)
 Addended by: Alysia Penna L on: 05/05/2023 12:04 PM   Modules accepted: Orders

## 2023-05-10 ENCOUNTER — Other Ambulatory Visit (HOSPITAL_BASED_OUTPATIENT_CLINIC_OR_DEPARTMENT_OTHER): Payer: Self-pay

## 2023-05-10 DIAGNOSIS — N920 Excessive and frequent menstruation with regular cycle: Secondary | ICD-10-CM | POA: Diagnosis not present

## 2023-05-10 DIAGNOSIS — N852 Hypertrophy of uterus: Secondary | ICD-10-CM | POA: Diagnosis not present

## 2023-05-10 DIAGNOSIS — D500 Iron deficiency anemia secondary to blood loss (chronic): Secondary | ICD-10-CM | POA: Diagnosis not present

## 2023-05-10 MED ORDER — NORETHINDRONE ACET-ETHINYL EST 1-20 MG-MCG PO TABS
1.0000 | ORAL_TABLET | Freq: Every day | ORAL | 3 refills | Status: DC
  Filled 2023-05-10: qty 63, 63d supply, fill #0

## 2023-05-11 ENCOUNTER — Encounter: Payer: Federal, State, Local not specified - PPO | Admitting: Family Medicine

## 2023-05-18 ENCOUNTER — Encounter: Payer: Federal, State, Local not specified - PPO | Admitting: Family Medicine

## 2023-05-23 DIAGNOSIS — N920 Excessive and frequent menstruation with regular cycle: Secondary | ICD-10-CM | POA: Diagnosis not present

## 2023-05-23 DIAGNOSIS — N852 Hypertrophy of uterus: Secondary | ICD-10-CM | POA: Diagnosis not present

## 2023-05-24 ENCOUNTER — Other Ambulatory Visit: Payer: Self-pay

## 2023-05-24 ENCOUNTER — Other Ambulatory Visit (HOSPITAL_BASED_OUTPATIENT_CLINIC_OR_DEPARTMENT_OTHER): Payer: Self-pay

## 2023-05-25 ENCOUNTER — Other Ambulatory Visit (HOSPITAL_BASED_OUTPATIENT_CLINIC_OR_DEPARTMENT_OTHER): Payer: Self-pay

## 2023-05-29 ENCOUNTER — Other Ambulatory Visit (HOSPITAL_BASED_OUTPATIENT_CLINIC_OR_DEPARTMENT_OTHER): Payer: Self-pay

## 2023-06-08 ENCOUNTER — Other Ambulatory Visit (HOSPITAL_BASED_OUTPATIENT_CLINIC_OR_DEPARTMENT_OTHER): Payer: Self-pay

## 2023-06-18 ENCOUNTER — Encounter: Payer: Self-pay | Admitting: Nurse Practitioner

## 2023-06-28 ENCOUNTER — Other Ambulatory Visit: Payer: Self-pay | Admitting: Oncology

## 2023-06-28 DIAGNOSIS — D500 Iron deficiency anemia secondary to blood loss (chronic): Secondary | ICD-10-CM

## 2023-07-03 ENCOUNTER — Inpatient Hospital Stay: Payer: Federal, State, Local not specified - PPO | Attending: Oncology

## 2023-07-03 ENCOUNTER — Inpatient Hospital Stay: Payer: Federal, State, Local not specified - PPO | Admitting: Oncology

## 2023-07-03 ENCOUNTER — Encounter: Payer: Self-pay | Admitting: Oncology

## 2023-07-03 ENCOUNTER — Other Ambulatory Visit (HOSPITAL_BASED_OUTPATIENT_CLINIC_OR_DEPARTMENT_OTHER): Payer: Self-pay

## 2023-07-03 VITALS — BP 135/85 | HR 105 | Temp 98.4°F | Resp 18 | Ht 62.0 in | Wt 194.1 lb

## 2023-07-03 DIAGNOSIS — N920 Excessive and frequent menstruation with regular cycle: Secondary | ICD-10-CM | POA: Diagnosis not present

## 2023-07-03 DIAGNOSIS — D500 Iron deficiency anemia secondary to blood loss (chronic): Secondary | ICD-10-CM

## 2023-07-03 LAB — CBC WITH DIFFERENTIAL (CANCER CENTER ONLY)
Abs Immature Granulocytes: 0.01 10*3/uL (ref 0.00–0.07)
Basophils Absolute: 0 10*3/uL (ref 0.0–0.1)
Basophils Relative: 1 %
Eosinophils Absolute: 0.1 10*3/uL (ref 0.0–0.5)
Eosinophils Relative: 2 %
HCT: 35.8 % — ABNORMAL LOW (ref 36.0–46.0)
Hemoglobin: 11 g/dL — ABNORMAL LOW (ref 12.0–15.0)
Immature Granulocytes: 0 %
Lymphocytes Relative: 33 %
Lymphs Abs: 1.8 10*3/uL (ref 0.7–4.0)
MCH: 21.6 pg — ABNORMAL LOW (ref 26.0–34.0)
MCHC: 30.7 g/dL (ref 30.0–36.0)
MCV: 70.2 fL — ABNORMAL LOW (ref 80.0–100.0)
Monocytes Absolute: 0.5 10*3/uL (ref 0.1–1.0)
Monocytes Relative: 9 %
Neutro Abs: 3 10*3/uL (ref 1.7–7.7)
Neutrophils Relative %: 55 %
Platelet Count: 329 10*3/uL (ref 150–400)
RBC: 5.1 MIL/uL (ref 3.87–5.11)
RDW: 22.8 % — ABNORMAL HIGH (ref 11.5–15.5)
WBC Count: 5.5 10*3/uL (ref 4.0–10.5)
nRBC: 0 % (ref 0.0–0.2)

## 2023-07-03 LAB — IRON AND TIBC
Iron: 28 ug/dL (ref 28–170)
Saturation Ratios: 6 % — ABNORMAL LOW (ref 10.4–31.8)
TIBC: 489 ug/dL — ABNORMAL HIGH (ref 250–450)
UIBC: 461 ug/dL

## 2023-07-03 LAB — FERRITIN: Ferritin: 9 ng/mL — ABNORMAL LOW (ref 11–307)

## 2023-07-03 MED ORDER — ACCRUFER 30 MG PO CAPS
30.0000 mg | ORAL_CAPSULE | Freq: Two times a day (BID) | ORAL | 5 refills | Status: DC
  Filled 2023-07-03: qty 60, 30d supply, fill #0

## 2023-07-03 NOTE — Assessment & Plan Note (Addendum)
 Chronic issue with poor tolerance to oral iron supplementation due to side effects including dizziness. Patient has been more compliant with iron supplementation recently. Menorrhagia likely contributing to iron deficiency.  On her initial visit with [place] on 05/02/2023, labs showed improved hemoglobin of 9.6, MCV still low at 62.9.  White count and platelet count were normal.  Iron studies indicated ongoing iron deficiency with ferritin of 5.  Rest of the iron parameters had improved with iron saturation of 15%, iron normal at 78.   Patient was worried about side effects from IV iron including chance of infusion reaction.  She preferred to remain on oral iron supplements but was asking for an alternative oral iron formulation for better tolerance.   Prescribed Ferrous Fumarate to patient's pharmacy.  Patient tolerated this reasonably well except for intermittent abdominal discomfort.   Repeat labs on 07/03/2023 showed improved hemoglobin of 11, MCV better at 70.  Ferritin remains low at 9.  Proposed alternative oral supplements of Accrufer (Ferric Maltol) 30 mg p.o. twice daily for better tolerance.  Prescription sent.  Only if persistent iron deficiency and progressive anemia is noted, we will consider IV iron.  RTC in 3 months for follow-up with repeat labs.

## 2023-07-03 NOTE — Assessment & Plan Note (Addendum)
 Heavy menstrual bleeding contributing to iron deficiency anemia. Birth control pills increased bleeding and were discontinued. Tranexamic acid effectively reduces bleeding during menstrual cycles. - Continue tranexamic acid as prescribed during menstrual cycles. - Follow up with gynecologist in two months to evaluate menstrual cycle management and adjust treatment as necessary.

## 2023-07-03 NOTE — Progress Notes (Signed)
 [facility]  HEMATOLOGY CLINIC PROGRESS NOTE  PATIENT NAME: Kiara Hall   MR#: [id number] DOB: 05-17-1980  Patient Care Team: Anne Ng, NP as PCP - General (Internal Medicine) Jens Som Madolyn Frieze, MD as PCP - Cardiology (Cardiology) Gerald Leitz, MD as Consulting Physician (Obstetrics and Gynecology)  Date of visit: 07/03/2023   ASSESSMENT & PLAN:   Kiara Hall is a 43 y.o. lady with a past medical history of menorrhagia, dyslipidemia, obesity, was referred to our service in December 2024 for evaluation of iron deficiency anemia.    Iron deficiency anemia due to chronic blood loss Chronic issue with poor tolerance to oral iron supplementation due to side effects including dizziness. Patient has been more compliant with iron supplementation recently. Menorrhagia likely contributing to iron deficiency.  On her initial visit with [place] on 05/02/2023, labs showed improved hemoglobin of 9.6, MCV still low at 62.9.  White count and platelet count were normal.  Iron studies indicated ongoing iron deficiency with ferritin of 5.  Rest of the iron parameters had improved with iron saturation of 15%, iron normal at 78.   Patient was worried about side effects from IV iron including chance of infusion reaction.  She preferred to remain on oral iron supplements but was asking for an alternative oral iron formulation for better tolerance.   Prescribed Ferrous Fumarate to patient's pharmacy.  Patient tolerated this reasonably well except for intermittent abdominal discomfort.   Repeat labs on 07/03/2023 showed improved hemoglobin of 11, MCV better at 70.  Ferritin remains low at 9.  Proposed alternative oral supplements of Accrufer (Ferric Maltol) 30 mg p.o. twice daily for better tolerance.  Prescription sent.  Only if persistent iron deficiency and progressive anemia is noted, we will consider IV iron.  RTC in 3 months for follow-up with repeat  labs.  Menorrhagia Heavy menstrual bleeding contributing to iron deficiency anemia. Birth control pills increased bleeding and were discontinued. Tranexamic acid effectively reduces bleeding during menstrual cycles. - Continue tranexamic acid as prescribed during menstrual cycles. - Follow up with gynecologist in two months to evaluate menstrual cycle management and adjust treatment as necessary.    I spent a total of 27 minutes during this encounter with the patient including review of chart and various tests results, discussions about plan of care and coordination of care plan.  I reviewed lab results and outside records for this visit and discussed relevant results with the patient. Diagnosis, plan of care and treatment options were also discussed in detail with the patient. Opportunity provided to ask questions and answers provided to her apparent satisfaction. Provided instructions to call our clinic with any problems, questions or concerns prior to return visit. I recommended to continue follow-up with PCP and sub-specialists. She verbalized understanding and agreed with the plan. No barriers to learning was detected.  Meryl Crutch, MD  07/03/2023 5:01 PM  [facility] - [facility] [address] [place] [address] Dept: [phone] Dept Fax: [phone]   CHIEF COMPLAINT/ REASON FOR VISIT:  Follow-up for iron deficiency anemia, related to menorrhagia.  INTERVAL HISTORY:  Discussed the use of AI scribe software for clinical note transcription with the patient, who gave verbal consent to proceed.   Patient returns for follow-up.  She has been taking a prescribed iron supplement, which she reports has been causing occasional stomach discomfort. She has been managing this by occasionally skipping a  day of the supplement when the discomfort becomes too much. She also reports that she has been  taking vitamin C with the iron supplement, which she believes may be contributing to the stomach discomfort. She has been considering trying a different iron supplement, one that is reportedly milder on the stomach.  In addition to the anemia, the patient has been dealing with heavy menstrual cycles. She has been working with her gynecologist to manage this and has tried a birth control pill, which unfortunately led to heavier bleeding. She has since stopped the birth control pill and reports that her cycle has returned to a lighter flow. She has also been taking tranexamic acid during her menstrual cycle, which seems to be helping. She has an upcoming appointment with her gynecologist in two months.  The patient also mentions that she has been feeling off on random days, which she attributes to the anemia. She reports that she has been able to stay awake all day, which is an improvement.  SUMMARY OF HEMATOLOGIC HISTORY:  On 03/28/2023, labs at her PCPs office showed severe anemia with hemoglobin of 6.7, hematocrit 25.1, MCV 53.3.  White count was 3700.  No differential was obtained.  Platelet count was normal at 307,000.  Iron studies showed severe iron deficiency with ferritin of 3, iron saturation of 3%, iron decreased at 13.  Patient did not tolerate oral iron supplementation.  She was referred to [place] for further evaluation and management of iron deficiency anemia.   She reports ongoing struggles with oral iron supplementation. She reports experiencing side effects such as constipation, heartburn, nausea, and dizziness, which have made consistent use of the supplements challenging. The patient also notes a sensation of dizziness when taking a 65mg  iron supplement.   The patient's iron deficiency has been a recurring issue, with previous episodes of low iron levels. However, she reports that when she is able to consistently take the iron supplements, her levels do improve.   In addition to the iron  deficiency, the patient has experienced changes in her menstrual cycle since having her tubes removed and stopping breastfeeding. She reports heavier cycles, which she believes may be contributing to her iron deficiency. She has been prescribed medication to take during her cycle but admits to not always taking it as directed.   The patient also mentions a craving for chewing on kosher salt, a symptom often associated with iron deficiency. She expresses a commitment to improving her health and managing her iron levels more effectively.   She had not noticed any other recent bleeding such as epistaxis, hematuria or hematochezia.  Chronic issue with poor tolerance to oral iron supplementation due to side effects including dizziness. Patient has been more compliant with iron supplementation recently. Menorrhagia likely contributing to iron deficiency.   On her initial visit with [place] on 05/02/2023, labs showed improved hemoglobin of 9.6, MCV still low at 62.9.  White count and platelet count were normal.  Iron studies indicated ongoing iron deficiency with ferritin of 5.  Rest of the iron parameters had improved with iron saturation of 15%, iron normal at 78.   Patient was worried about side effects from IV iron including chance of infusion reaction.  She preferred to remain on oral iron supplements but was asking for an alternative oral iron formulation for better tolerance.   Prescribed Ferrous Fumarate to patient's pharmacy.  Patient tolerated this reasonably well except for intermittent abdominal discomfort.   Repeat labs on 07/03/2023 showed improved hemoglobin of  11, MCV better at 70.  Ferritin remains low at 9.  Proposed alternative oral supplements of Accrufer (Ferric Maltol) 30 mg p.o. twice daily for better tolerance.  Prescription sent.  Only if persistent iron deficiency and progressive anemia is noted, we will consider IV iron.  I have reviewed the past medical history, past surgical  history, social history and family history with the patient and they are unchanged from previous note.  ALLERGIES: She is allergic to metronidazole.  MEDICATIONS:  Current Outpatient Medications  Medication Sig Dispense Refill   Ferrous Fumarate 324 MG TABS Take 1 tablet by mouth daily at 12 noon. 30 tablet 5   pantoprazole (PROTONIX) 20 MG tablet Take 1 tablet (20 mg total) by mouth 2 (two) times daily before a meal. 60 tablet 0   Ferric Maltol (ACCRUFER) 30 MG CAPS Take 1 capsule (30 mg total) by mouth 2 (two) times daily. 1 hour before or 2 hours after eating a meal. 60 capsule 5   No current facility-administered medications for this visit.     REVIEW OF SYSTEMS:    ROS  All other pertinent systems were reviewed with the patient and are negative.  PHYSICAL EXAMINATION:   Onc Performance Status - 07/03/23 1541       ECOG Perf Status   ECOG Perf Status Fully active, able to carry on all pre-disease performance without restriction      KPS SCALE   KPS % SCORE Normal, no compliants, no evidence of disease             Vitals:   07/03/23 1531  BP: 135/85  Pulse: (!) 105  Resp: 18  Temp: 98.4 F (36.9 C)  SpO2: 100%   Filed Weights   07/03/23 1531  Weight: 194 lb 1.6 oz (88 kg)    Physical Exam Constitutional:      General: She is not in acute distress.    Appearance: Normal appearance.  HENT:     Head: Normocephalic and atraumatic.  Eyes:     General: No scleral icterus.    Conjunctiva/sclera: Conjunctivae normal.  Cardiovascular:     Rate and Rhythm: Normal rate and regular rhythm.     Heart sounds: Normal heart sounds.  Pulmonary:     Effort: Pulmonary effort is normal.     Breath sounds: Normal breath sounds.  Abdominal:     General: There is no distension.  Musculoskeletal:     Right lower leg: No edema.     Left lower leg: No edema.  Neurological:     General: No focal deficit present.     Mental Status: She is alert and oriented to person,  place, and time.  Psychiatric:        Mood and Affect: Mood normal.        Behavior: Behavior normal.        Thought Content: Thought content normal.     LABORATORY DATA:   I have reviewed the data as listed.  Results for orders placed or performed in visit on 07/03/23  Ferritin  Result Value Ref Range   Ferritin 9 (L) 11 - 307 ng/mL  CBC with Differential (Cancer Center Only)  Result Value Ref Range   WBC Count 5.5 4.0 - 10.5 K/uL   RBC 5.10 3.87 - 5.11 MIL/uL   Hemoglobin 11.0 (L) 12.0 - 15.0 g/dL   HCT 16.1 (L) 09.6 - 04.5 %   MCV 70.2 (L) 80.0 - 100.0 fL   MCH 21.6 (L)  26.0 - 34.0 pg   MCHC 30.7 30.0 - 36.0 g/dL   RDW 16.1 (H) 09.6 - 04.5 %   Platelet Count 329 150 - 400 K/uL   nRBC 0.0 0.0 - 0.2 %   Neutrophils Relative % 55 %   Neutro Abs 3.0 1.7 - 7.7 K/uL   Lymphocytes Relative 33 %   Lymphs Abs 1.8 0.7 - 4.0 K/uL   Monocytes Relative 9 %   Monocytes Absolute 0.5 0.1 - 1.0 K/uL   Eosinophils Relative 2 %   Eosinophils Absolute 0.1 0.0 - 0.5 K/uL   Basophils Relative 1 %   Basophils Absolute 0.0 0.0 - 0.1 K/uL   Immature Granulocytes 0 %   Abs Immature Granulocytes 0.01 0.00 - 0.07 K/uL    RADIOGRAPHIC STUDIES:  No recent pertinent imaging studies available to review.  Orders Placed This Encounter  Procedures   CBC with Differential (Cancer Center Only)    Standing Status:   Future    Expected Date:   10/03/2023    Expiration Date:   07/02/2024   Iron and TIBC    Standing Status:   Future    Expected Date:   10/03/2023    Expiration Date:   07/02/2024   Ferritin    Standing Status:   Future    Expected Date:   10/03/2023    Expiration Date:   07/02/2024     Future Appointments  Date Time Provider Department Center  10/03/2023  3:15 PM [facility] PHLEBOTOMIST [facility] None  10/03/2023  3:30 PM Essance Gatti, Archie Patten, MD [facility] None     This document was completed utilizing speech recognition software. Grammatical errors, random word insertions, pronoun errors, and  incomplete sentences are an occasional consequence of this system due to software limitations, ambient noise, and hardware issues. Any formal questions or concerns about the content, text or information contained within the body of this dictation should be directly addressed to the provider for clarification.

## 2023-07-04 ENCOUNTER — Other Ambulatory Visit (HOSPITAL_BASED_OUTPATIENT_CLINIC_OR_DEPARTMENT_OTHER): Payer: Self-pay

## 2023-07-05 ENCOUNTER — Other Ambulatory Visit (HOSPITAL_BASED_OUTPATIENT_CLINIC_OR_DEPARTMENT_OTHER): Payer: Self-pay

## 2023-07-06 ENCOUNTER — Other Ambulatory Visit (HOSPITAL_BASED_OUTPATIENT_CLINIC_OR_DEPARTMENT_OTHER): Payer: Self-pay

## 2023-07-07 ENCOUNTER — Other Ambulatory Visit (HOSPITAL_BASED_OUTPATIENT_CLINIC_OR_DEPARTMENT_OTHER): Payer: Self-pay

## 2023-07-10 ENCOUNTER — Other Ambulatory Visit (HOSPITAL_BASED_OUTPATIENT_CLINIC_OR_DEPARTMENT_OTHER): Payer: Self-pay

## 2023-07-10 NOTE — Telephone Encounter (Signed)
 Form has been faxed and copy has been made. Placed on [employer].

## 2023-07-11 ENCOUNTER — Other Ambulatory Visit (HOSPITAL_BASED_OUTPATIENT_CLINIC_OR_DEPARTMENT_OTHER): Payer: Self-pay

## 2023-07-12 ENCOUNTER — Other Ambulatory Visit (HOSPITAL_BASED_OUTPATIENT_CLINIC_OR_DEPARTMENT_OTHER): Payer: Self-pay

## 2023-07-13 ENCOUNTER — Other Ambulatory Visit (HOSPITAL_BASED_OUTPATIENT_CLINIC_OR_DEPARTMENT_OTHER): Payer: Self-pay

## 2023-07-14 ENCOUNTER — Other Ambulatory Visit (HOSPITAL_BASED_OUTPATIENT_CLINIC_OR_DEPARTMENT_OTHER): Payer: Self-pay

## 2023-07-17 ENCOUNTER — Other Ambulatory Visit (HOSPITAL_BASED_OUTPATIENT_CLINIC_OR_DEPARTMENT_OTHER): Payer: Self-pay

## 2023-07-18 ENCOUNTER — Other Ambulatory Visit (HOSPITAL_BASED_OUTPATIENT_CLINIC_OR_DEPARTMENT_OTHER): Payer: Self-pay

## 2023-07-19 ENCOUNTER — Other Ambulatory Visit (HOSPITAL_BASED_OUTPATIENT_CLINIC_OR_DEPARTMENT_OTHER): Payer: Self-pay

## 2023-07-20 ENCOUNTER — Other Ambulatory Visit (HOSPITAL_BASED_OUTPATIENT_CLINIC_OR_DEPARTMENT_OTHER): Payer: Self-pay

## 2023-07-24 ENCOUNTER — Other Ambulatory Visit (HOSPITAL_BASED_OUTPATIENT_CLINIC_OR_DEPARTMENT_OTHER): Payer: Self-pay

## 2023-07-25 ENCOUNTER — Other Ambulatory Visit (HOSPITAL_BASED_OUTPATIENT_CLINIC_OR_DEPARTMENT_OTHER): Payer: Self-pay

## 2023-07-26 ENCOUNTER — Other Ambulatory Visit (HOSPITAL_BASED_OUTPATIENT_CLINIC_OR_DEPARTMENT_OTHER): Payer: Self-pay

## 2023-07-27 ENCOUNTER — Other Ambulatory Visit (HOSPITAL_BASED_OUTPATIENT_CLINIC_OR_DEPARTMENT_OTHER): Payer: Self-pay

## 2023-08-08 ENCOUNTER — Other Ambulatory Visit (HOSPITAL_BASED_OUTPATIENT_CLINIC_OR_DEPARTMENT_OTHER): Payer: Self-pay

## 2023-08-08 ENCOUNTER — Encounter: Payer: Self-pay | Admitting: Nurse Practitioner

## 2023-08-08 ENCOUNTER — Ambulatory Visit: Admitting: Nurse Practitioner

## 2023-08-08 VITALS — BP 132/82 | HR 82 | Temp 98.2°F | Ht 62.0 in | Wt 199.0 lb

## 2023-08-08 DIAGNOSIS — Z6836 Body mass index (BMI) 36.0-36.9, adult: Secondary | ICD-10-CM

## 2023-08-08 DIAGNOSIS — E7800 Pure hypercholesterolemia, unspecified: Secondary | ICD-10-CM

## 2023-08-08 DIAGNOSIS — E66812 Obesity, class 2: Secondary | ICD-10-CM

## 2023-08-08 MED ORDER — WEGOVY 0.25 MG/0.5ML ~~LOC~~ SOAJ
0.2500 mg | SUBCUTANEOUS | 0 refills | Status: DC
  Filled 2023-08-08: qty 2, 28d supply, fill #0

## 2023-08-08 MED ORDER — WEGOVY 0.5 MG/0.5ML ~~LOC~~ SOAJ
0.5000 mg | SUBCUTANEOUS | 0 refills | Status: DC
  Filled 2023-08-08: qty 2, fill #0
  Filled 2023-09-04: qty 2, 28d supply, fill #0

## 2023-08-08 NOTE — Assessment & Plan Note (Signed)
 Total weight loss in 10months: 26lbs with use of wegovy 2.4mg . med was discontinued 4months ago due to cost. Denies any adverse effects with wegovy 2.4mg  weekly Exercise: decreased due to change in work schedule, but she plans to resume walking daily Diet: low carb and low fat diet at this time She has gained 12lbs in last 4months without wegovy States she is unable to afford weight management clinic copays Wt Readings from Last 3 Encounters:  08/08/23 199 lb (90.3 kg)  07/03/23 194 lb 1.6 oz (88 kg)  05/02/23 187 lb 12.8 oz (85.2 kg)    Start wegovy 0.25mg  weekly x 4weeks, then 0.5mg  weekly x 4weeks Advised to maintain a high protein, high fiber diet and adequate oral hydration. Advised to also maintain daily exercise- cardio and weight training F/up in 2months

## 2023-08-08 NOTE — Patient Instructions (Signed)
 How to Increase Your Level of Physical Activity Getting regular physical activity is important for your overall health and well-being. Most people do not get enough exercise. There are easy ways to increase your level of physical activity, even if you have not been very active in the past or if you are just starting out. What are the benefits of physical activity? Physical activity has many short-term and long-term benefits. Being active on a regular basis can improve your physical and mental health as well as provide other benefits. Physical health benefits Helping you lose weight or maintain a healthy weight. Strengthening your muscles and bones. Reducing your risk of certain long-term (chronic) diseases, including heart disease, cancer, and diabetes. Being able to move around more easily and for longer periods of time without getting tired (increased endurance or stamina). Improving your ability to fight off illness (enhanced immunity). Being able to sleep better. Helping you stay healthy as you get older, including: Helping you stay mobile, or capable of walking and moving around. Preventing accidents, such as falls. Increasing life expectancy. Mental health benefits Boosting your mood and improving your self-esteem. Lowering your chance of having mental health problems, such as depression or anxiety. Helping you feel good about your body. Other benefits Finding new sources of fun and enjoyment. Meeting new people who share a common interest. Before you begin If you have a chronic illness or have not been active for a while, check with your health care provider about how to get started. Ask your health care provider what activities are safe for you. Start out slowly. Walking or doing some simple chair exercises is a good place to start, especially if you have not been active before or for a long time. Set goals that you can work toward. Ask your health care provider how much exercise is  best for you. In general, most adults should: Do moderate-intensity exercise for at least 150 minutes each week (30 minutes on most days of the week) or vigorous exercise for at least 75 minutes each week, or a combination of these. Moderate-intensity exercise can include walking at a quick pace, biking, yoga, water aerobics, or gardening. Vigorous exercise involves activities that take more effort, such as jogging or running, playing sports, swimming laps, or jumping rope. Do strength exercises on at least 2 days each week. This can include weight lifting, body weight exercises, and resistance-band exercises. How to be more physically active Make a plan  Try to find activities that you enjoy. You are more likely to commit to an exercise routine if it does not feel like a chore. If you have bone or joint problems, choose low-impact exercises, like walking or swimming. Use these tips for being successful with an exercise plan: Find a workout partner for accountability. Join a group or class, such as an aerobics class, cycling class, or sports team. Make family time active. Go for a walk, bike, or swim. Include a variety of exercises each week. Consider using a fitness tracker, such as a mobile phone app or a device worn like a watch, that will count the number of steps you take each day. Many people strive to reach 10,000 steps a day. Find ways to be active in your daily routines Besides your formal exercise plans, you can find ways to do physical activity during your daily routines, such as: Walking or biking to work or to the store. Taking the stairs instead of the elevator. Parking farther away from the door at work  or at the store. Planning walking meetings. Walking around while you are on the phone. Where to find more information Centers for Disease Control and Prevention: [employer].pt President's Council on Fitness, Sports & Nutrition: www.fitness.gov ChooseMyPlate:  http://www.harvey.com/ Contact a health care provider if: You have headaches, muscle aches, or joint pain that is concerning. You feel dizzy or light-headed while exercising. You faint. You feel your heart skipping, racing, or fluttering. You have chest pain while exercising. Summary Exercise benefits your mind and body at any age, even if you are just starting out. If you have a chronic illness or have not been active for a while, check with your health care provider before increasing your physical activity. Choose activities that are safe and enjoyable for you. Ask your health care provider what activities are safe for you. Start slowly. Tell your health care provider if you have problems as you start to increase your activity level. This information is not intended to replace advice given to you by your health care provider. Make sure you discuss any questions you have with your health care provider. Document Revised: 08/14/2020 Document Reviewed: 08/14/2020 Elsevier Patient Education  2024 Elsevier Inc.  Mediterranean Diet A Mediterranean diet is based on the traditions of countries on the [employer]. It focuses on eating more: Fruits and vegetables. Whole grains, beans, nuts, and seeds. Heart-healthy fats. These are fats that are good for your heart. It involves eating less: Dairy. Meat and eggs. Processed foods with added sugar, salt, and fat. This type of diet can help prevent certain conditions. It can also improve outcomes if you have a long-term (chronic) disease, such as kidney or heart disease. What are tips for following this plan? Reading food labels Check packaged foods for: The serving size. For foods such as rice and pasta, the serving size is the amount of cooked product, not dry. The total fat. Avoid foods with saturated fat or trans fat. Added sugars, such as corn syrup. Shopping  Try to have a balanced diet. Buy a variety of foods, such as: Fresh fruits and  vegetables. You may be able to get these from local farmers markets. You can also buy them frozen. Grains, beans, nuts, and seeds. Some of these can be bought in bulk. Fresh seafood. Poultry and eggs. Low-fat dairy products. Buy whole ingredients instead of foods that have already been packaged. If you can't get fresh seafood, buy precooked frozen shrimp or canned fish, such as tuna, salmon, or sardines. Stock your pantry so you always have certain foods on hand, such as olive oil, canned tuna, canned tomatoes, rice, pasta, and beans. Cooking Cook foods with extra-virgin olive oil instead of using butter or other vegetable oils. Have meat as a side dish. Have vegetables or grains as your main dish. This means having meat in small portions or adding small amounts of meat to foods like pasta or stew. Use beans or vegetables instead of meat in common dishes like chili or lasagna. Try out different cooking methods. Try roasting, broiling, steaming, and sauting vegetables. Add frozen vegetables to soups, stews, pasta, or rice. Add nuts or seeds for added healthy fats and plant protein at each meal. You can add these to yogurt, salads, or vegetable dishes. Marinate fish or vegetables using olive oil, lemon juice, garlic, and fresh herbs. Meal planning Plan to eat a vegetarian meal one day each week. Try to work up to two vegetarian meals, if possible. Eat seafood two or more times a week. Have  healthy snacks on hand. These may include: Vegetable sticks with hummus. Greek yogurt. Fruit and nut trail mix. Eat balanced meals. These should include: Fruit: 2-3 servings a day. Vegetables: 4-5 servings a day. Low-fat dairy: 2 servings a day. Fish, poultry, or lean meat: 1 serving a day. Beans and legumes: 2 or more servings a week. Nuts and seeds: 1-2 servings a day. Whole grains: 6-8 servings a day. Extra-virgin olive oil: 3-4 servings a day. Limit red meat and sweets to just a few servings a  month. Lifestyle  Try to cook and eat meals with your family. Drink enough fluid to keep your pee (urine) pale yellow. Be active every day. This includes: Aerobic exercise, which is exercise that causes your heart to beat faster. Examples include running and swimming. Leisure activities like gardening, walking, or housework. Get 7-8 hours of sleep each night. Drink red wine if your provider says you can. A glass of wine is 5 oz (150 mL). You may be allowed to have: Up to 1 glass a day if you're female and not pregnant. Up to 2 glasses a day if you're female. What foods should I eat? Fruits Apples. Apricots. Avocado. Berries. Bananas. Cherries. Dates. Figs. Grapes. Lemons. Melon. Oranges. Peaches. Plums. Pomegranate. Vegetables Artichokes. Beets. Broccoli. Cabbage. Carrots. Eggplant. Green beans. Chard. Kale. Spinach. Onions. Leeks. Peas. Squash. Tomatoes. Peppers. Radishes. Grains Whole-grain pasta. Brown rice. Bulgur wheat. Polenta. Couscous. Whole-wheat bread. Orpah Cobb. Meats and other proteins Beans. Almonds. Sunflower seeds. Pine nuts. Peanuts. Cod. Salmon. Scallops. Shrimp. Tuna. Tilapia. Clams. Oysters. Eggs. Chicken or [place] without skin. Dairy Low-fat milk. Cheese. Greek yogurt. Fats and oils Extra-virgin olive oil. Avocado oil. Grapeseed oil. Beverages Water. Red wine. Herbal tea. Sweets and desserts Greek yogurt with honey. Baked apples. Poached pears. Trail mix. Seasonings and condiments Basil. Cilantro. Coriander. Cumin. Mint. Parsley. Sage. Rosemary. Tarragon. Garlic. Oregano. Thyme. Pepper. Balsamic vinegar. Tahini. Hummus. Tomato sauce. Olives. Mushrooms. The items listed above may not be all the foods and drinks you can have. Talk to a dietitian to learn more. What foods should I limit? This is a list of foods that should be eaten rarely. Fruits Fruit canned in syrup. Vegetables Deep-fried potatoes, like [place] fries. Grains Packaged pasta or rice dishes.  Cereal with added sugar. Snacks with added sugar. Meats and other proteins Beef. Pork. Lamb. Chicken or [place] with skin. Hot dogs. Tomasa Blase. Dairy Ice cream. Sour cream. Whole milk. Fats and oils Butter. Canola oil. Vegetable oil. Beef fat (tallow). Lard. Beverages Juice. Sugar-sweetened soft drinks. Beer. Liquor and spirits. Sweets and desserts Cookies. Cakes. Pies. Candy. Seasonings and condiments Mayonnaise. Pre-made sauces and marinades. The items listed above may not be all the foods and drinks you should limit. Talk to a dietitian to learn more. Where to find more information American Heart Association (AHA): heart.org This information is not intended to replace advice given to you by your health care provider. Make sure you discuss any questions you have with your health care provider. Document Revised: 07/31/2022 Document Reviewed: 07/31/2022 Elsevier Patient Education  2024 [employer].

## 2023-08-08 NOTE — Progress Notes (Signed)
 Established Patient Visit  Patient: Kiara Hall   DOB: Sep 13, 1980   43 y.o. Female  MRN: [id number] Visit Date: 08/08/2023  Subjective:    Chief Complaint  Patient presents with   Obesity    Pt is in office today to start over for weight lost. Pt states she has not taken [facility] since December.    HPI Obesity Total weight loss in 10months: 26lbs with use of wegovy 2.4mg . med was discontinued 4months ago due to cost. Denies any adverse effects with wegovy 2.4mg  weekly Exercise: decreased due to change in work schedule, but she plans to resume walking daily Diet: low carb and low fat diet at this time She has gained 12lbs in last 4months without wegovy States she is unable to afford weight management clinic copays Wt Readings from Last 3 Encounters:  08/08/23 199 lb (90.3 kg)  07/03/23 194 lb 1.6 oz (88 kg)  05/02/23 187 lb 12.8 oz (85.2 kg)    Start wegovy 0.25mg  weekly x 4weeks, then 0.5mg  weekly x 4weeks Advised to maintain a high protein, high fiber diet and adequate oral hydration. Advised to also maintain daily exercise- cardio and weight training F/up in 2months   Reviewed medical, surgical, and social history today  Medications: Outpatient Medications Prior to Visit  Medication Sig   Ferrous Fumarate 324 MG TABS Take 1 tablet by mouth daily at 12 noon.   pantoprazole (PROTONIX) 20 MG tablet Take 1 tablet (20 mg total) by mouth 2 (two) times daily before a meal.   tranexamic acid (LYSTEDA) 650 MG TABS tablet Take 1,300 mg by mouth 3 (three) times daily.   Ferric Maltol (ACCRUFER) 30 MG CAPS Take 1 capsule (30 mg total) by mouth 2 (two) times daily, 1 hour before or 2 hours after eating a meal. (Patient not taking: Reported on 08/08/2023)   No facility-administered medications prior to visit.   Reviewed past medical and social history.   ROS per HPI above  Last CBC Lab Results  Component Value Date   WBC 5.5 07/03/2023   HGB 11.0 (L) 07/03/2023    HCT 35.8 (L) 07/03/2023   MCV 70.2 (L) 07/03/2023   MCH 21.6 (L) 07/03/2023   RDW 22.8 (H) 07/03/2023   PLT 329 07/03/2023   Last metabolic panel Lab Results  Component Value Date   GLUCOSE 84 05/02/2023   NA 137 05/02/2023   K 4.5 05/02/2023   CL 102 05/02/2023   CO2 28 05/02/2023   BUN 12 05/02/2023   CREATININE 0.62 05/02/2023   GFRNONAA >60 05/02/2023   CALCIUM 9.6 05/02/2023   PROT 7.7 05/02/2023   ALBUMIN 4.4 05/02/2023   BILITOT 0.5 05/02/2023   ALKPHOS 50 05/02/2023   AST 13 (L) 05/02/2023   ALT 7 05/02/2023   ANIONGAP 7 05/02/2023   Last lipids Lab Results  Component Value Date   CHOL 166 03/28/2023   HDL 50.60 03/28/2023   LDLCALC 106 (H) 03/28/2023   TRIG 47.0 03/28/2023   CHOLHDL 3 03/28/2023   Last hemoglobin A1c Lab Results  Component Value Date   HGBA1C 5.2 03/28/2023   Last thyroid functions Lab Results  Component Value Date   TSH 1.11 08/20/2021   T3TOTAL 139.9 11/06/2007   Last vitamin D Lab Results  Component Value Date   VD25OH 56.25 09/27/2022        Objective:  BP 132/82 (BP Location: Left Arm, Patient Position: Sitting,  Cuff Size: Normal)   Pulse 82   Temp 98.2 F (36.8 C) (Temporal)   Ht 5\' 2"  (1.575 m)   Wt 199 lb (90.3 kg)   SpO2 99%   BMI 36.40 kg/m      Physical Exam Vitals and nursing note reviewed.  Cardiovascular:     Rate and Rhythm: Normal rate and regular rhythm.     Pulses: Normal pulses.     Heart sounds: Normal heart sounds.  Pulmonary:     Effort: Pulmonary effort is normal.     Breath sounds: Normal breath sounds.  Musculoskeletal:     Right lower leg: No edema.     Left lower leg: No edema.  Neurological:     Mental Status: She is alert and oriented to person, place, and time.     No results found for any visits on 08/08/23.    Assessment & Plan:    Problem List Items Addressed This Visit     Hypercholesterolemia   Relevant Medications   tranexamic acid (LYSTEDA) 650 MG TABS tablet    Obesity - Primary   Total weight loss in 10months: 26lbs with use of wegovy 2.4mg . med was discontinued 4months ago due to cost. Denies any adverse effects with wegovy 2.4mg  weekly Exercise: decreased due to change in work schedule, but she plans to resume walking daily Diet: low carb and low fat diet at this time She has gained 12lbs in last 4months without wegovy States she is unable to afford weight management clinic copays Wt Readings from Last 3 Encounters:  08/08/23 199 lb (90.3 kg)  07/03/23 194 lb 1.6 oz (88 kg)  05/02/23 187 lb 12.8 oz (85.2 kg)    Start wegovy 0.25mg  weekly x 4weeks, then 0.5mg  weekly x 4weeks Advised to maintain a high protein, high fiber diet and adequate oral hydration. Advised to also maintain daily exercise- cardio and weight training F/up in 2months      Relevant Medications   Semaglutide-Weight Management (WEGOVY) 0.25 MG/0.5ML SOAJ   Semaglutide-Weight Management (WEGOVY) 0.5 MG/0.5ML SOAJ (Start on 09/04/2023)   Return in about 2 months (around 10/08/2023) for Weight management, hyperlipidemia (fasting).     Alysia Penna, NP

## 2023-08-10 ENCOUNTER — Ambulatory Visit
Admission: RE | Admit: 2023-08-10 | Discharge: 2023-08-10 | Disposition: A | Discharge status: Home / Self Care | Source: Ambulatory Visit | Attending: Nurse Practitioner | Admitting: Nurse Practitioner

## 2023-08-10 ENCOUNTER — Ambulatory Visit: Admitting: Nurse Practitioner

## 2023-08-10 DIAGNOSIS — Z1231 Encounter for screening mammogram for malignant neoplasm of breast: Secondary | ICD-10-CM

## 2023-08-15 ENCOUNTER — Ambulatory Visit: Admitting: Nurse Practitioner

## 2023-08-22 DIAGNOSIS — N920 Excessive and frequent menstruation with regular cycle: Secondary | ICD-10-CM | POA: Diagnosis not present

## 2023-08-22 DIAGNOSIS — D500 Iron deficiency anemia secondary to blood loss (chronic): Secondary | ICD-10-CM | POA: Diagnosis not present

## 2023-09-04 ENCOUNTER — Other Ambulatory Visit (HOSPITAL_BASED_OUTPATIENT_CLINIC_OR_DEPARTMENT_OTHER): Payer: Self-pay

## 2023-09-05 ENCOUNTER — Other Ambulatory Visit (HOSPITAL_BASED_OUTPATIENT_CLINIC_OR_DEPARTMENT_OTHER): Payer: Self-pay

## 2023-09-21 ENCOUNTER — Encounter: Payer: Self-pay | Admitting: Nurse Practitioner

## 2023-09-21 NOTE — Telephone Encounter (Signed)
 Spoke with the pt today @ 3:38 and schedule pt an appointment to see nche in june

## 2023-10-03 ENCOUNTER — Inpatient Hospital Stay (HOSPITAL_BASED_OUTPATIENT_CLINIC_OR_DEPARTMENT_OTHER): Admitting: Oncology

## 2023-10-03 ENCOUNTER — Inpatient Hospital Stay: Attending: Oncology

## 2023-10-03 VITALS — BP 140/78 | HR 100 | Temp 97.9°F | Resp 18 | Ht 62.0 in | Wt 203.1 lb

## 2023-10-03 DIAGNOSIS — D500 Iron deficiency anemia secondary to blood loss (chronic): Secondary | ICD-10-CM

## 2023-10-03 DIAGNOSIS — N920 Excessive and frequent menstruation with regular cycle: Secondary | ICD-10-CM | POA: Diagnosis not present

## 2023-10-03 LAB — CBC WITH DIFFERENTIAL (CANCER CENTER ONLY)
Abs Immature Granulocytes: 0.01 10*3/uL (ref 0.00–0.07)
Basophils Absolute: 0 10*3/uL (ref 0.0–0.1)
Basophils Relative: 1 %
Eosinophils Absolute: 0.2 10*3/uL (ref 0.0–0.5)
Eosinophils Relative: 3 %
HCT: 33.8 % — ABNORMAL LOW (ref 36.0–46.0)
Hemoglobin: 10.3 g/dL — ABNORMAL LOW (ref 12.0–15.0)
Immature Granulocytes: 0 %
Lymphocytes Relative: 40 %
Lymphs Abs: 1.9 10*3/uL (ref 0.7–4.0)
MCH: 22.4 pg — ABNORMAL LOW (ref 26.0–34.0)
MCHC: 30.5 g/dL (ref 30.0–36.0)
MCV: 73.6 fL — ABNORMAL LOW (ref 80.0–100.0)
Monocytes Absolute: 0.4 10*3/uL (ref 0.1–1.0)
Monocytes Relative: 9 %
Neutro Abs: 2.2 10*3/uL (ref 1.7–7.7)
Neutrophils Relative %: 47 %
Platelet Count: 232 10*3/uL (ref 150–400)
RBC: 4.59 MIL/uL (ref 3.87–5.11)
RDW: 21.2 % — ABNORMAL HIGH (ref 11.5–15.5)
WBC Count: 4.8 10*3/uL (ref 4.0–10.5)
nRBC: 0 % (ref 0.0–0.2)

## 2023-10-03 LAB — FERRITIN: Ferritin: 16 ng/mL (ref 11–307)

## 2023-10-03 LAB — IRON AND TIBC
Iron: 29 ug/dL (ref 28–170)
Saturation Ratios: 7 % — ABNORMAL LOW (ref 10.4–31.8)
TIBC: 433 ug/dL (ref 250–450)
UIBC: 404 ug/dL

## 2023-10-03 NOTE — Assessment & Plan Note (Signed)
 Heavy menstrual bleeding contributing to iron  deficiency anemia. Birth control pills increased bleeding and were discontinued. Tranexamic acid  effectively reduces bleeding during menstrual cycles. - Continue tranexamic acid  as prescribed during menstrual cycles. - Follow up with gynecologist to evaluate menstrual cycle management and adjust treatment as necessary.

## 2023-10-03 NOTE — Progress Notes (Signed)
 "  [facility]  HEMATOLOGY CLINIC PROGRESS NOTE  PATIENT NAME: Kiara Hall   MR#: [id number] DOB: April 03, 1981  Patient Care Team: Katheen Roselie Rockford, NP as PCP - General (Internal Medicine) Pietro Redell RAMAN, MD as PCP - Cardiology (Cardiology) Rosalva Sawyer, MD as Consulting Physician (Obstetrics and Gynecology)  Date of visit: 10/03/2023   ASSESSMENT & PLAN:   Kiara Hall is a 43 y.o. lady with a past medical history of menorrhagia, dyslipidemia, obesity, was referred to our service in December 2024 for evaluation of iron  deficiency anemia.    Iron  deficiency anemia due to chronic blood loss Chronic issue with poor tolerance to oral iron  supplementation due to side effects including dizziness. Patient has been more compliant with iron  supplementation recently. Menorrhagia likely contributing to iron  deficiency.  On her initial visit with us  on 05/02/2023, labs showed improved hemoglobin of 9.6, MCV still low at 62.9.  White count and platelet count were normal.  Iron  studies indicated ongoing iron  deficiency with ferritin of 5.  Rest of the iron  parameters had improved with iron  saturation of 15%, iron  normal at 78.   Patient was worried about side effects from IV iron  including chance of infusion reaction.  She preferred to remain on oral iron  supplements but was asking for an alternative oral iron  formulation for better tolerance.   Prescribed Ferrous Fumarate  to patient's pharmacy.  Patient tolerated this reasonably well except for intermittent abdominal discomfort.   Repeat labs on 07/03/2023 showed improved hemoglobin of 11, MCV better at 70.  Ferritin remains low at 9.  Proposed alternative oral supplements of Accrufer  (Ferric Maltol ) 30 mg p.o. twice daily for better tolerance.  Prescription sent in March 2025 but unfortunately her insurance did not approve this medication.  Hence patient continued to take ferrous fumarate .  She is tolerating it reasonably  well but causes occasional GI discomfort.  Labs today revealed stable hemoglobin of 10.3, MCV 70.6, which is improved compared to prior.  White count and platelet count are within normal limits.  Ferritin better at 16.  Iron  saturation 7%.  Prefers to continue oral iron  supplements.  If she has side effects from ferrous fumarate , she was advised to start taking OTC iron  supplements like Slow Fe.  Only if persistent iron  deficiency and progressive anemia is noted, we will consider IV iron .  RTC in 4 months for follow-up with repeat labs.  Menorrhagia Heavy menstrual bleeding contributing to iron  deficiency anemia. Birth control pills increased bleeding and were discontinued. Tranexamic acid  effectively reduces bleeding during menstrual cycles. - Continue tranexamic acid  as prescribed during menstrual cycles. - Follow up with gynecologist to evaluate menstrual cycle management and adjust treatment as necessary.   I spent a total of 20 minutes during this encounter with the patient including review of chart and various tests results, discussions about plan of care and coordination of care plan.  I reviewed lab results and outside records for this visit and discussed relevant results with the patient. Diagnosis, plan of care and treatment options were also discussed in detail with the patient. Opportunity provided to ask questions and answers provided to her apparent satisfaction. Provided instructions to call our clinic with any problems, questions or concerns prior to return visit. I recommended to continue follow-up with PCP and sub-specialists. She verbalized understanding and agreed with the plan. No barriers to learning was detected.  Chinita Patten, MD  10/03/2023 3:37 PM  [facility] - A  DEPT OF [place] [address] [place] [address] Dept: [phone] Dept Fax: [phone]   CHIEF COMPLAINT/ REASON FOR  VISIT:  Follow-up for iron  deficiency anemia, related to menorrhagia.  INTERVAL HISTORY:  Discussed the use of AI scribe software for clinical note transcription with the patient, who gave verbal consent to proceed.  History of Present Illness Kiara Hall is a 43 year old female with iron  deficiency anemia who presents for follow-up regarding her iron  supplementation and hemoglobin levels.  She has been taking ferrous fumarate  once daily since December but experiences stomach discomfort. She is not taking it with orange juice or vitamin C. She considered trying Accrufer  (spirochromaltol), but her insurance did not approve it. Due to gastrointestinal side effects, she is contemplating alternatives.  Her hemoglobin levels have fluctuated, with a recent decrease to 10.3 g/dL, which she attributes to her menstrual cycle. Previously, her hemoglobin was 9.6 g/dL in December, increased to 11 g/dL, and is now slightly lower. Her red blood cell size has improved from 62 fL in December to 73 fL currently, with normal being 80-100 fL. Her ferritin was previously 5 ng/mL, increased to 9 ng/mL, and she hopes for further improvement.  She is currently on her menstrual cycle, which she describes as 'bad cycles,' and is using Tranexamic acid  to manage it. She experiences dizziness prior to her cycle and is considering discussing a hysterectomy with her gynecologist as a potential solution to her menstrual issues.  She had a mammogram last month, which was normal.    SUMMARY OF HEMATOLOGIC HISTORY:  On 03/28/2023, labs at her PCPs office showed severe anemia with hemoglobin of 6.7, hematocrit 25.1, MCV 53.3.  White count was 3700.  No differential was obtained.  Platelet count was normal at 307,000.  Iron  studies showed severe iron  deficiency with ferritin of 3, iron  saturation of 3%, iron  decreased at 13.  Patient did not tolerate oral iron  supplementation.  She was referred to us  for further evaluation  and management of iron  deficiency anemia.   She reports ongoing struggles with oral iron  supplementation. She reports experiencing side effects such as constipation, heartburn, nausea, and dizziness, which have made consistent use of the supplements challenging. The patient also notes a sensation of dizziness when taking a 65mg  iron  supplement.   The patient's iron  deficiency has been a recurring issue, with previous episodes of low iron  levels. However, she reports that when she is able to consistently take the iron  supplements, her levels do improve.   In addition to the iron  deficiency, the patient has experienced changes in her menstrual cycle since having her tubes removed and stopping breastfeeding. She reports heavier cycles, which she believes may be contributing to her iron  deficiency. She has been prescribed medication to take during her cycle but admits to not always taking it as directed.   The patient also mentions a craving for chewing on kosher salt, a symptom often associated with iron  deficiency. She expresses a commitment to improving her health and managing her iron  levels more effectively.   She had not noticed any other recent bleeding such as epistaxis, hematuria or hematochezia.  Chronic issue with poor tolerance to oral iron  supplementation due to side effects including dizziness. Patient has been more compliant with iron  supplementation recently. Menorrhagia likely contributing to iron  deficiency.   On her initial visit with us  on 05/02/2023, labs showed improved hemoglobin of 9.6, MCV still low at 62.9.  White count and platelet count  were normal.  Iron  studies indicated ongoing iron  deficiency with ferritin of 5.  Rest of the iron  parameters had improved with iron  saturation of 15%, iron  normal at 78.   Patient was worried about side effects from IV iron  including chance of infusion reaction.  She preferred to remain on oral iron  supplements but was asking for an  alternative oral iron  formulation for better tolerance.   Prescribed Ferrous Fumarate  to patient's pharmacy.  Patient tolerated this reasonably well except for intermittent abdominal discomfort.   Repeat labs on 07/03/2023 showed improved hemoglobin of 11, MCV better at 70.  Ferritin remains low at 9.  Proposed alternative oral supplements of Accrufer  (Ferric Maltol ) 30 mg p.o. twice daily for better tolerance.  Prescription sent.  Only if persistent iron  deficiency and progressive anemia is noted, we will consider IV iron .  I have reviewed the past medical history, past surgical history, social history and family history with the patient and they are unchanged from previous note.  ALLERGIES: She is allergic to metronidazole.  MEDICATIONS:  Current Outpatient Medications  Medication Sig Dispense Refill   Ferrous Fumarate  324 MG TABS Take 1 tablet by mouth daily at 12 noon. 30 tablet 5   pantoprazole  (PROTONIX ) 20 MG tablet Take 1 tablet (20 mg total) by mouth 2 (two) times daily before a meal. 60 tablet 0   Semaglutide -Weight Management (WEGOVY ) 0.5 MG/0.5ML SOAJ Inject 0.5 mg into the skin once a week. 2 mL 0   tranexamic acid  (LYSTEDA ) 650 MG TABS tablet Take 1,300 mg by mouth 3 (three) times daily.     No current facility-administered medications for this visit.     REVIEW OF SYSTEMS:    ROS  All other pertinent systems were reviewed with the patient and are negative.  PHYSICAL EXAMINATION:   Onc Performance Status - 10/03/23 1510       ECOG Perf Status   ECOG Perf Status Fully active, able to carry on all pre-disease performance without restriction      KPS SCALE   KPS % SCORE Normal, no compliants, no evidence of disease              Vitals:   10/03/23 1506  BP: (!) 140/78  Pulse: 100  Resp: 18  Temp: 97.9 F (36.6 C)  SpO2: 100%   Filed Weights   10/03/23 1506  Weight: 203 lb 1.6 oz (92.1 kg)    Physical Exam Constitutional:      General: She is  not in acute distress.    Appearance: Normal appearance.  HENT:     Head: Normocephalic and atraumatic.  Cardiovascular:     Rate and Rhythm: Normal rate.  Pulmonary:     Effort: Pulmonary effort is normal.  Abdominal:     General: There is no distension.  Neurological:     General: No focal deficit present.     Mental Status: She is alert and oriented to person, place, and time.  Psychiatric:        Mood and Affect: Mood normal.        Behavior: Behavior normal.     LABORATORY DATA:   I have reviewed the data as listed.  Results for orders placed or performed in visit on 10/03/23  Ferritin  Result Value Ref Range   Ferritin 16 11 - 307 ng/mL  Iron  and TIBC  Result Value Ref Range   Iron  29 28 - 170 ug/dL   TIBC 566 749 - 549 ug/dL  Saturation Ratios 7 (L) 10.4 - 31.8 %   UIBC 404 ug/dL  CBC with Differential (Cancer Center Only)  Result Value Ref Range   WBC Count 4.8 4.0 - 10.5 K/uL   RBC 4.59 3.87 - 5.11 MIL/uL   Hemoglobin 10.3 (L) 12.0 - 15.0 g/dL   HCT 66.1 (L) 63.9 - 53.9 %   MCV 73.6 (L) 80.0 - 100.0 fL   MCH 22.4 (L) 26.0 - 34.0 pg   MCHC 30.5 30.0 - 36.0 g/dL   RDW 78.7 (H) 88.4 - 84.4 %   Platelet Count 232 150 - 400 K/uL   nRBC 0.0 0.0 - 0.2 %   Neutrophils Relative % 47 %   Neutro Abs 2.2 1.7 - 7.7 K/uL   Lymphocytes Relative 40 %   Lymphs Abs 1.9 0.7 - 4.0 K/uL   Monocytes Relative 9 %   Monocytes Absolute 0.4 0.1 - 1.0 K/uL   Eosinophils Relative 3 %   Eosinophils Absolute 0.2 0.0 - 0.5 K/uL   Basophils Relative 1 %   Basophils Absolute 0.0 0.0 - 0.1 K/uL   Immature Granulocytes 0 %   Abs Immature Granulocytes 0.01 0.00 - 0.07 K/uL     RADIOGRAPHIC STUDIES:  No recent pertinent imaging studies available to review.  Orders Placed This Encounter  Procedures   CBC with Differential (Cancer Center Only)    Standing Status:   Future    Expected Date:   02/02/2024    Expiration Date:   10/02/2024   Iron  and TIBC    Standing Status:    Future    Expected Date:   02/02/2024    Expiration Date:   10/02/2024   Ferritin    Standing Status:   Future    Expected Date:   02/02/2024    Expiration Date:   10/02/2024     Future Appointments  Date Time Provider Department Center  10/05/2023 11:00 AM Nche, Roselie Rockford, NP [facility]  02/05/2024  3:00 PM [facility] PHLEBOTOMIST [facility] None  02/05/2024  3:30 PM Chau Savell, Chinita, MD [facility] None     This document was completed utilizing speech recognition software. Grammatical errors, random word insertions, pronoun errors, and incomplete sentences are an occasional consequence of this system due to software limitations, ambient noise, and hardware issues. Any formal questions or concerns about the content, text or information contained within the body of this dictation should be directly addressed to the provider for clarification.  "

## 2023-10-03 NOTE — Assessment & Plan Note (Signed)
 Chronic issue with poor tolerance to oral iron  supplementation due to side effects including dizziness. Patient has been more compliant with iron  supplementation recently. Menorrhagia likely contributing to iron  deficiency.  On her initial visit with us  on 05/02/2023, labs showed improved hemoglobin of 9.6, MCV still low at 62.9.  White count and platelet count were normal.  Iron  studies indicated ongoing iron  deficiency with ferritin of 5.  Rest of the iron  parameters had improved with iron  saturation of 15%, iron  normal at 78.   Patient was worried about side effects from IV iron  including chance of infusion reaction.  She preferred to remain on oral iron  supplements but was asking for an alternative oral iron  formulation for better tolerance.   Prescribed Ferrous Fumarate  to patient's pharmacy.  Patient tolerated this reasonably well except for intermittent abdominal discomfort.   Repeat labs on 07/03/2023 showed improved hemoglobin of 11, MCV better at 70.  Ferritin remains low at 9.  Proposed alternative oral supplements of Accrufer  (Ferric Maltol ) 30 mg p.o. twice daily for better tolerance.  Prescription sent.  Only if persistent iron  deficiency and progressive anemia is noted, we will consider IV iron .  RTC in 3 months for follow-up with repeat labs.

## 2023-10-04 ENCOUNTER — Ambulatory Visit: Payer: Self-pay | Admitting: Oncology

## 2023-10-04 ENCOUNTER — Encounter: Payer: Self-pay | Admitting: Oncology

## 2023-10-05 ENCOUNTER — Encounter: Payer: Self-pay | Admitting: Nurse Practitioner

## 2023-10-05 ENCOUNTER — Other Ambulatory Visit (HOSPITAL_BASED_OUTPATIENT_CLINIC_OR_DEPARTMENT_OTHER): Payer: Self-pay

## 2023-10-05 ENCOUNTER — Ambulatory Visit: Admitting: Nurse Practitioner

## 2023-10-05 VITALS — BP 140/84 | HR 82 | Temp 96.4°F | Ht 61.0 in | Wt 202.2 lb

## 2023-10-05 DIAGNOSIS — F410 Panic disorder [episodic paroxysmal anxiety] without agoraphobia: Secondary | ICD-10-CM | POA: Diagnosis not present

## 2023-10-05 DIAGNOSIS — Z6838 Body mass index (BMI) 38.0-38.9, adult: Secondary | ICD-10-CM

## 2023-10-05 DIAGNOSIS — E559 Vitamin D deficiency, unspecified: Secondary | ICD-10-CM

## 2023-10-05 DIAGNOSIS — E66812 Obesity, class 2: Secondary | ICD-10-CM

## 2023-10-05 DIAGNOSIS — F411 Generalized anxiety disorder: Secondary | ICD-10-CM

## 2023-10-05 LAB — COMPREHENSIVE METABOLIC PANEL WITH GFR
ALT: 10 U/L (ref 0–35)
AST: 15 U/L (ref 0–37)
Albumin: 4.6 g/dL (ref 3.5–5.2)
Alkaline Phosphatase: 51 U/L (ref 39–117)
BUN: 8 mg/dL (ref 6–23)
CO2: 27 meq/L (ref 19–32)
Calcium: 9.7 mg/dL (ref 8.4–10.5)
Chloride: 102 meq/L (ref 96–112)
Creatinine, Ser: 0.6 mg/dL (ref 0.40–1.20)
GFR: 110.79 mL/min (ref >60.00)
Glucose, Bld: 91 mg/dL (ref 70–99)
Potassium: 4.1 meq/L (ref 3.5–5.1)
Sodium: 139 meq/L (ref 135–145)
Total Bilirubin: 0.5 mg/dL (ref 0.2–1.2)
Total Protein: 7.9 g/dL (ref 6.0–8.3)

## 2023-10-05 LAB — TSH: TSH: 1.31 u[IU]/mL (ref 0.35–5.50)

## 2023-10-05 LAB — VITAMIN D 25 HYDROXY (VIT D DEFICIENCY, FRACTURES): VITD: 19.94 ng/mL — ABNORMAL LOW (ref 30.00–100.00)

## 2023-10-05 MED ORDER — WEGOVY 1.7 MG/0.75ML ~~LOC~~ SOAJ
1.7000 mg | SUBCUTANEOUS | 0 refills | Status: DC
  Filled 2023-10-05: qty 3, 28d supply, fill #0

## 2023-10-05 MED ORDER — ESCITALOPRAM OXALATE 10 MG PO TABS
10.0000 mg | ORAL_TABLET | Freq: Every day | ORAL | 5 refills | Status: DC
Start: 2023-10-05 — End: 2023-11-02
  Filled 2023-10-05: qty 30, 30d supply, fill #0

## 2023-10-05 NOTE — Assessment & Plan Note (Signed)
 I witnessed an episode in office today. During our conversation about need for antidepressant to treat PMDD, and previous evaluation of physical symptoms; Mrs Kiara Hall reported feeling flushed, chest tightness, dizziness, and HR on smart watch went up to 122.  I coached her to take slow deep breathes. Her symptoms resolved within 15 seconds. HR returned to 84. Despite my attempt to provide reassurance based on previous normal cardiology evaluation; she continues to state that her symptoms may have a cardiac etiology.  I advised her to schedule an appointment with cardiology and her therapist

## 2023-10-05 NOTE — Progress Notes (Signed)
 Established Patient Visit  Patient: Kiara Hall   DOB: 1980/08/10   43 y.o. Female  MRN: [id number] Visit Date: 10/05/2023  Subjective:     Chief Complaint  Patient presents with   Follow-up    2 month   HPI Obesity Diet: high protein, and high fiber diet Exercise: walking daily-1mins daily, weight training daily No weight loss noted with 0.25 and 0.5mg  of wegovy  Wt Readings from Last 3 Encounters:  10/05/23 202 lb 3.2 oz (91.7 kg)  10/03/23 203 lb 1.6 oz (92.1 kg)  08/08/23 199 lb (90.3 kg)    Increased wegovy  dose to 1.7mg  weekly Encouraged to maintain heart healthy diet and daily exercise F/up in 79month  GAD (generalized anxiety disorder) Reports intermittent periods of impending doom, worse 2weeks prior to menstrual cycle. She describes this as wakes up gasping for air, palpitation, chest tightness, and sensation that something is wrong. This last for a few seconds and spontaneously resolves. This episode is accompanied by brain fog, feeling of disconnection from surrounding, low energy, and increased worries about her health; which last for about 2-3weeks. Previous evaluation by cardiology 2023 and 2024: no acute finding Previous counseling session with EAP, but not current. Unable to tolerate zoloft and buspar  in the past Hx of postpartum depression  Advised to schedule appointment with therapist and stop caffeine consumption She agreed to start lexapro 10mg  F/up in 1week  Panic disorder I witnessed an episode in office today. During our conversation about need for antidepressant to treat PMDD, and previous evaluation of physical symptoms; Mrs Mirna Amis reported feeling flushed, chest tightness, dizziness, and HR on smart watch went up to 122.  I coached her to take slow deep breathes. Her symptoms resolved within 15 seconds. HR returned to 84. Despite my attempt to provide reassurance based on previous normal cardiology evaluation; she continues to  state that her symptoms may have a cardiac etiology.  I advised her to schedule an appointment with cardiology and her therapist  Wt Readings from Last 3 Encounters:  10/05/23 202 lb 3.2 oz (91.7 kg)  10/03/23 203 lb 1.6 oz (92.1 kg)  08/08/23 199 lb (90.3 kg)    Reviewed medical, surgical, and social history today  Medications: Outpatient Medications Prior to Visit  Medication Sig   Ferrous Fumarate  324 MG TABS Take 1 tablet by mouth daily at 12 noon.   pantoprazole  (PROTONIX ) 20 MG tablet Take 1 tablet (20 mg total) by mouth 2 (two) times daily before a meal. (Patient taking differently: Take 20 mg by mouth daily.)   tranexamic acid  (LYSTEDA ) 650 MG TABS tablet Take 1,300 mg by mouth 3 (three) times daily.   [DISCONTINUED] Semaglutide -Weight Management (WEGOVY ) 0.5 MG/0.5ML SOAJ Inject 0.5 mg into the skin once a week.   No facility-administered medications prior to visit.   Reviewed past medical and social history.   ROS per HPI above  Last CBC Lab Results  Component Value Date   WBC 4.8 10/03/2023   HGB 10.3 (L) 10/03/2023   HCT 33.8 (L) 10/03/2023   MCV 73.6 (L) 10/03/2023   MCH 22.4 (L) 10/03/2023   RDW 21.2 (H) 10/03/2023   PLT 232 10/03/2023   Last metabolic panel Lab Results  Component Value Date   GLUCOSE 84 05/02/2023   NA 137 05/02/2023   K 4.5 05/02/2023   CL 102 05/02/2023   CO2 28 05/02/2023   BUN 12 05/02/2023  CREATININE 0.62 05/02/2023   GFRNONAA >60 05/02/2023   CALCIUM 9.6 05/02/2023   PROT 7.7 05/02/2023   ALBUMIN 4.4 05/02/2023   BILITOT 0.5 05/02/2023   ALKPHOS 50 05/02/2023   AST 13 (L) 05/02/2023   ALT 7 05/02/2023   ANIONGAP 7 05/02/2023   Last thyroid  functions Lab Results  Component Value Date   TSH 1.11 08/20/2021   T3TOTAL 139.9 11/06/2007   Last vitamin D  Lab Results  Component Value Date   VD25OH 56.25 09/27/2022        Objective:  BP (!) 140/84 (BP Location: Left Arm, Patient Position: Sitting, Cuff Size: Large)    Pulse 82   Temp (!) 96.4 F (35.8 C) (Temporal)   Ht 5\' 1"  (1.549 m)   Wt 202 lb 3.2 oz (91.7 kg)   LMP 09/30/2023   SpO2 97%   BMI 38.21 kg/m      Physical Exam Vitals and nursing note reviewed.  Cardiovascular:     Rate and Rhythm: Normal rate and regular rhythm.     Pulses: Normal pulses.     Heart sounds: Normal heart sounds.  Pulmonary:     Effort: Pulmonary effort is normal.  Neurological:     Mental Status: She is alert and oriented to person, place, and time.  Psychiatric:        Attention and Perception: Attention normal.        Mood and Affect: Mood is anxious. Affect is tearful.        Speech: Speech normal.        Behavior: Behavior is cooperative.        Cognition and Memory: Cognition normal.     No results found for any visits on 10/05/23.    Assessment & Plan:    Problem List Items Addressed This Visit     GAD (generalized anxiety disorder)   Reports intermittent periods of impending doom, worse 2weeks prior to menstrual cycle. She describes this as wakes up gasping for air, palpitation, chest tightness, and sensation that something is wrong. This last for a few seconds and spontaneously resolves. This episode is accompanied by brain fog, feeling of disconnection from surrounding, low energy, and increased worries about her health; which last for about 2-3weeks. Previous evaluation by cardiology 2023 and 2024: no acute finding Previous counseling session with EAP, but not current. Unable to tolerate zoloft and buspar  in the past Hx of postpartum depression  Advised to schedule appointment with therapist and stop caffeine consumption She agreed to start lexapro 10mg  F/up in 1week      Relevant Medications   escitalopram (LEXAPRO) 10 MG tablet   Obesity - Primary   Diet: high protein, and high fiber diet Exercise: walking daily-21mins daily, weight training daily No weight loss noted with 0.25 and 0.5mg  of wegovy  Wt Readings from Last 3  Encounters:  10/05/23 202 lb 3.2 oz (91.7 kg)  10/03/23 203 lb 1.6 oz (92.1 kg)  08/08/23 199 lb (90.3 kg)    Increased wegovy  dose to 1.7mg  weekly Encouraged to maintain heart healthy diet and daily exercise F/up in 19month      Relevant Medications   Semaglutide -Weight Management (WEGOVY ) 1.7 MG/0.75ML SOAJ   Other Relevant Orders   TSH   Comprehensive metabolic panel with GFR   Panic disorder   I witnessed an episode in office today. During our conversation about need for antidepressant to treat PMDD, and previous evaluation of physical symptoms; Mrs Mirna Amis reported feeling flushed, chest tightness, dizziness, and  HR on smart watch went up to 122.  I coached her to take slow deep breathes. Her symptoms resolved within 15 seconds. HR returned to 84. Despite my attempt to provide reassurance based on previous normal cardiology evaluation; she continues to state that her symptoms may have a cardiac etiology.  I advised her to schedule an appointment with cardiology and her therapist      Relevant Medications   escitalopram (LEXAPRO) 10 MG tablet   Other Relevant Orders   TSH   Vitamin D  deficiency   Relevant Orders   VITAMIN D  25 Hydroxy (Vit-D Deficiency, Fractures)   Return in about 4 weeks (around 11/02/2023) for Weight management, depression and anxiety (repeat PHQ/GAD).     Kathrene Parents, NP

## 2023-10-05 NOTE — Assessment & Plan Note (Addendum)
 Diet: high protein, and high fiber diet Exercise: walking daily-36mins daily, weight training daily No weight loss noted with 0.25 and 0.5mg  of wegovy  Wt Readings from Last 3 Encounters:  10/05/23 202 lb 3.2 oz (91.7 kg)  10/03/23 203 lb 1.6 oz (92.1 kg)  08/08/23 199 lb (90.3 kg)    Increased wegovy  dose to 1.7mg  weekly Encouraged to maintain heart healthy diet and daily exercise F/up in 24month

## 2023-10-05 NOTE — Assessment & Plan Note (Addendum)
 Reports intermittent periods of impending doom, worse 2weeks prior to menstrual cycle. She describes this as wakes up gasping for air, palpitation, chest tightness, and sensation that something is wrong. This last for a few seconds and spontaneously resolves. This episode is accompanied by brain fog, feeling of disconnection from surrounding, low energy, and increased worries about her health; which last for about 2-3weeks. Previous evaluation by cardiology 2023 and 2024: no acute finding Previous counseling session with EAP, but not current. Unable to tolerate zoloft and buspar  in the past Hx of postpartum depression  Advised to schedule appointment with therapist and stop caffeine consumption She agreed to start lexapro 10mg  F/up in 1week

## 2023-10-05 NOTE — Patient Instructions (Signed)
 Go to lab Maintain Heart healthy diet and daily exercise. Maintain current medications. Start lexapro 1/2 tablet once daily for 1 week and then increase to a full tablet once daily continuously. Plan follow up in 1 month to evaluate progress.

## 2023-10-06 ENCOUNTER — Encounter: Payer: Self-pay | Admitting: Nurse Practitioner

## 2023-10-06 ENCOUNTER — Other Ambulatory Visit (HOSPITAL_BASED_OUTPATIENT_CLINIC_OR_DEPARTMENT_OTHER): Payer: Self-pay

## 2023-10-06 MED ORDER — VITAMIN D (ERGOCALCIFEROL) 1.25 MG (50000 UNIT) PO CAPS
50000.0000 [IU] | ORAL_CAPSULE | ORAL | 0 refills | Status: DC
  Filled 2023-10-06: qty 12, 84d supply, fill #0

## 2023-10-06 NOTE — Addendum Note (Signed)
 Addended by: Lovey Rudd on: 10/06/2023 08:17 AM   Modules accepted: Orders

## 2023-10-09 ENCOUNTER — Ambulatory Visit: Admitting: Family Medicine

## 2023-10-09 ENCOUNTER — Ambulatory Visit: Payer: Self-pay | Admitting: Nurse Practitioner

## 2023-10-30 ENCOUNTER — Other Ambulatory Visit (HOSPITAL_COMMUNITY): Payer: Self-pay

## 2023-10-30 ENCOUNTER — Telehealth: Payer: Self-pay

## 2023-10-30 NOTE — Telephone Encounter (Signed)
 Pharmacy Patient Advocate Encounter   Received notification from CoverMyMeds that prior authorization for Zepbound  5 is required/requested.   Insurance verification completed.   The patient is insured through [facility] .   Per test claim: PA required; PA submitted to above mentioned insurance via CoverMyMeds Key/confirmation #/EOC [id number] Status is pending

## 2023-10-31 NOTE — Telephone Encounter (Signed)
 Additional information has been requested from the patient's insurance in order to proceed with the prior authorization request. Requested information has been sent, or form has been filled out and faxed back to [phone]

## 2023-11-01 ENCOUNTER — Other Ambulatory Visit (HOSPITAL_COMMUNITY): Payer: Self-pay

## 2023-11-01 NOTE — Telephone Encounter (Signed)
 Pharmacy Patient Advocate Encounter  Received notification from [facility] that Prior Authorization for Zepbound  5 mg/0.5 ml pen  has been APPROVED from 10/01/23 to 10/30/24   PA #/Case ID/Reference #: [employer]

## 2023-11-02 ENCOUNTER — Other Ambulatory Visit (HOSPITAL_BASED_OUTPATIENT_CLINIC_OR_DEPARTMENT_OTHER): Payer: Self-pay

## 2023-11-02 ENCOUNTER — Ambulatory Visit: Admitting: Nurse Practitioner

## 2023-11-02 ENCOUNTER — Encounter: Payer: Self-pay | Admitting: Nurse Practitioner

## 2023-11-02 ENCOUNTER — Ambulatory Visit: Payer: Self-pay | Admitting: Nurse Practitioner

## 2023-11-02 VITALS — BP 136/82 | HR 84 | Temp 98.7°F | Ht 61.0 in | Wt 196.4 lb

## 2023-11-02 DIAGNOSIS — F411 Generalized anxiety disorder: Secondary | ICD-10-CM

## 2023-11-02 DIAGNOSIS — F410 Panic disorder [episodic paroxysmal anxiety] without agoraphobia: Secondary | ICD-10-CM | POA: Diagnosis not present

## 2023-11-02 DIAGNOSIS — E66812 Obesity, class 2: Secondary | ICD-10-CM

## 2023-11-02 DIAGNOSIS — Z6837 Body mass index (BMI) 37.0-37.9, adult: Secondary | ICD-10-CM

## 2023-11-02 LAB — VITAMIN B12: Vitamin B-12: 486 pg/mL (ref 211–911)

## 2023-11-02 MED ORDER — WEGOVY 2.4 MG/0.75ML ~~LOC~~ SOAJ
2.4000 mg | SUBCUTANEOUS | 1 refills | Status: DC
  Filled 2023-11-02: qty 3, 28d supply, fill #0
  Filled 2023-11-22 – 2023-11-23 (×2): qty 3, 28d supply, fill #1

## 2023-11-02 NOTE — Patient Instructions (Signed)
 Go to lab Maintain Heart healthy diet and daily exercise. Maintain current medications.

## 2023-11-02 NOTE — Assessment & Plan Note (Signed)
 She is concerned anxious feeling may be due to B12 deficiency. She is requesting for B12 lab order even if her insurance does not cover cost. Order enetred

## 2023-11-02 NOTE — Assessment & Plan Note (Addendum)
 Lost 6lbs in last 42month Diet:6meals a day, high fiber, high protein, low fat Exercise: daily walking , and strength training daily Sleep: 8hrs uninterrupted. Feels rested No adverse effects with wegovy  1.7mg  Wt Readings from Last 3 Encounters:  11/02/23 196 lb 6.4 oz (89.1 kg)  10/05/23 202 lb 3.2 oz (91.7 kg)  10/03/23 203 lb 1.6 oz (92.1 kg)    Increase wegovy  dose to 2.4mg  weekly Maintain diet modification and exercise regimen Encouraged to use 3D scan at [facility] for biometric measurements monthly or body measurements (neck, chest, arms, waist, thighs) every 2weeks. F/up in 2months

## 2023-11-02 NOTE — Progress Notes (Signed)
 Established Patient Visit  Patient: Kiara Hall   DOB: 1981-03-31   43 y.o. Female  MRN: [id number] Visit Date: 11/02/2023  Subjective:    Chief Complaint  Patient presents with   Follow-up    4 week follow up for weight management, anxiety and depression    HPI Anxiety with depression Unable to tolerate lexapro  (flushing sensation). Took med for 5days, then stopped She has eliminated caffeine consumption since last appt Has appointment with therapist once week. She opted to avoid any additional meds at this time.  Encouraged to maintain CBT sessions and avoid caffeine F/up in 2months  Obesity Lost 6lbs in last 64month Diet:65meals a day, high fiber, high protein, low fat Exercise: daily walking , and strength training daily Sleep: 8hrs uninterrupted. Feels rested No adverse effects with wegovy  1.7mg  Wt Readings from Last 3 Encounters:  11/02/23 196 lb 6.4 oz (89.1 kg)  10/05/23 202 lb 3.2 oz (91.7 kg)  10/03/23 203 lb 1.6 oz (92.1 kg)    Increase wegovy  dose to 2.4mg  weekly Maintain diet modification and exercise regimen Encouraged to use 3D scan at [facility] for biometric measurements monthly or body measurements (neck, chest, arms, waist, thighs) every 2weeks. F/up in 2months  Panic disorder She is concerned anxious feeling may be due to B12 deficiency. She is requesting for B12 lab order even if her insurance does not cover cost. Order enetred      11/02/2023    9:15 AM 05/05/2022   11:42 AM 03/29/2022   11:01 AM 12/23/2021    9:59 AM  GAD 7 : Generalized Anxiety Score  Nervous, Anxious, on Edge 1 1 1 1   Control/stop worrying 0 0 0 0  Worry too much - different things 0 0 1 0  Trouble relaxing 0 0 0 0  Restless 0 0 0 0  Easily annoyed or irritable 1 0 0 1  Afraid - awful might happen 0 0 1 1  Total GAD 7 Score 2 1 3 3   Anxiety Difficulty Not difficult at all Somewhat difficult Somewhat difficult Not difficult at all      11/02/2023    9:13  AM 05/02/2023    2:18 PM 03/28/2023    9:20 AM  Depression screen PHQ 2/9  Decreased Interest 0 0 0  Down, Depressed, Hopeless 0 0 0  PHQ - 2 Score 0 0 0  Altered sleeping 0    Tired, decreased energy 1    Change in appetite 0    Feeling bad or failure about yourself  0    Trouble concentrating 0    Moving slowly or fidgety/restless 0    Suicidal thoughts 0    PHQ-9 Score 1    Difficult doing work/chores Not difficult at all      Reviewed medical, surgical, and social history today  Medications: Outpatient Medications Prior to Visit  Medication Sig   Ferrous Fumarate  324 MG TABS Take 1 tablet by mouth daily at 12 noon.   pantoprazole  (PROTONIX ) 20 MG tablet Take 1 tablet (20 mg total) by mouth 2 (two) times daily before a meal. (Patient taking differently: Take 20 mg by mouth as needed for heartburn or indigestion.)   tranexamic acid  (LYSTEDA ) 650 MG TABS tablet Take 1,300 mg by mouth 3 (three) times daily.   Vitamin D , Ergocalciferol , (DRISDOL ) 1.25 MG (50000 UNIT) CAPS capsule Take 1 capsule (50,000 Units total) by mouth every 7 (  seven) days.   [DISCONTINUED] Semaglutide -Weight Management (WEGOVY ) 1.7 MG/0.75ML SOAJ Inject 1.7 mg into the skin once a week.   [DISCONTINUED] escitalopram  (LEXAPRO ) 10 MG tablet Take 1 tablet (10 mg total) by mouth daily. (Patient not taking: Reported on 11/02/2023)   No facility-administered medications prior to visit.   Reviewed past medical and social history.   ROS per HPI above  Last CBC Lab Results  Component Value Date   WBC 4.8 10/03/2023   HGB 10.3 (L) 10/03/2023   HCT 33.8 (L) 10/03/2023   MCV 73.6 (L) 10/03/2023   MCH 22.4 (L) 10/03/2023   RDW 21.2 (H) 10/03/2023   PLT 232 10/03/2023   Last metabolic panel Lab Results  Component Value Date   GLUCOSE 91 10/05/2023   NA 139 10/05/2023   K 4.1 10/05/2023   CL 102 10/05/2023   CO2 27 10/05/2023   BUN 8 10/05/2023   CREATININE 0.60 10/05/2023   GFR 110.79 10/05/2023    CALCIUM 9.7 10/05/2023   PROT 7.9 10/05/2023   ALBUMIN 4.6 10/05/2023   BILITOT 0.5 10/05/2023   ALKPHOS 51 10/05/2023   AST 15 10/05/2023   ALT 10 10/05/2023   ANIONGAP 7 05/02/2023   Last lipids Lab Results  Component Value Date   CHOL 166 03/28/2023   HDL 50.60 03/28/2023   LDLCALC 106 (H) 03/28/2023   TRIG 47.0 03/28/2023   CHOLHDL 3 03/28/2023   Last hemoglobin A1c Lab Results  Component Value Date   HGBA1C 5.2 03/28/2023   Last thyroid  functions Lab Results  Component Value Date   TSH 1.31 10/05/2023   T3TOTAL 139.9 11/06/2007   Last vitamin D  Lab Results  Component Value Date   VD25OH 19.94 (L) 10/05/2023   Last vitamin B12 and Folate Lab Results  Component Value Date   VITAMINB12 491 06/02/2006   FOLATE 16.7 06/02/2006      Objective:  BP 136/82 (BP Location: Left Arm, Patient Position: Sitting, Cuff Size: Large)   Pulse 84   Temp 98.7 F (37.1 C) (Oral)   Ht 5' 1 (1.549 m)   Wt 196 lb 6.4 oz (89.1 kg)   LMP 10/25/2023   SpO2 98%   BMI 37.11 kg/m      Physical Exam Vitals and nursing note reviewed.  Cardiovascular:     Rate and Rhythm: Normal rate and regular rhythm.     Pulses: Normal pulses.     Heart sounds: Normal heart sounds.  Pulmonary:     Effort: Pulmonary effort is normal.     Breath sounds: Normal breath sounds.  Neurological:     Mental Status: She is alert and oriented to person, place, and time.  Psychiatric:        Attention and Perception: Attention normal.        Mood and Affect: Mood normal.        Speech: Speech normal.        Behavior: Behavior is cooperative.        Thought Content: Thought content normal.        Cognition and Memory: Cognition and memory normal.        Judgment: Judgment normal.     No results found for any visits on 11/02/23.    Assessment & Plan:    Problem List Items Addressed This Visit     Anxiety with depression   Unable to tolerate lexapro  (flushing sensation). Took med for 5days,  then stopped She has eliminated caffeine consumption since last appt Has  appointment with therapist once week. She opted to avoid any additional meds at this time.  Encouraged to maintain CBT sessions and avoid caffeine F/up in 2months      Obesity   Lost 6lbs in last 65month Diet:68meals a day, high fiber, high protein, low fat Exercise: daily walking , and strength training daily Sleep: 8hrs uninterrupted. Feels rested No adverse effects with wegovy  1.7mg  Wt Readings from Last 3 Encounters:  11/02/23 196 lb 6.4 oz (89.1 kg)  10/05/23 202 lb 3.2 oz (91.7 kg)  10/03/23 203 lb 1.6 oz (92.1 kg)    Increase wegovy  dose to 2.4mg  weekly Maintain diet modification and exercise regimen Encouraged to use [facility] scan [facility] for biometric measurements monthly or body measurements (neck, chest, arms, waist, thighs) every 2weeks. F/up in 2months      Relevant Medications   Semaglutide -Weight Management (WEGOVY ) 2.4 MG/0.75ML SOAJ   Panic disorder - Primary   She is concerned anxious feeling may be due to B12 deficiency. She is requesting for B12 lab order even if her insurance does not cover cost. Order enetred      Return in about 2 months (around 01/03/2024) for  anxiety, Weight management.     Roselie Mood, NP

## 2023-11-02 NOTE — Assessment & Plan Note (Addendum)
 Unable to tolerate lexapro  (flushing sensation). Took med for 5days, then stopped She has eliminated caffeine consumption since last appt Has appointment with therapist once week. She opted to avoid any additional meds at this time.  Encouraged to maintain CBT sessions and avoid caffeine F/up in 2months

## 2023-11-22 ENCOUNTER — Other Ambulatory Visit (HOSPITAL_BASED_OUTPATIENT_CLINIC_OR_DEPARTMENT_OTHER): Payer: Self-pay

## 2023-11-23 ENCOUNTER — Telehealth: Payer: Self-pay | Admitting: Pharmacy Technician

## 2023-11-23 NOTE — Telephone Encounter (Signed)
 Pharmacy Patient Advocate Encounter   Received notification from CoverMyMeds that prior authorization for Wegovy  1.7MG /0.75ML auto-injectors is due for renewal.   Insurance verification completed.   The patient is insured through [facility].  Action: Medication has been discontinued. Archived Key: [id number]

## 2023-12-05 ENCOUNTER — Other Ambulatory Visit (HOSPITAL_COMMUNITY)
Admission: RE | Admit: 2023-12-05 | Discharge: 2023-12-05 | Disposition: A | Discharge status: Home / Self Care | Source: Ambulatory Visit | Attending: Obstetrics and Gynecology | Admitting: Obstetrics and Gynecology

## 2023-12-05 ENCOUNTER — Other Ambulatory Visit: Payer: Self-pay | Admitting: Obstetrics and Gynecology

## 2023-12-05 DIAGNOSIS — Z124 Encounter for screening for malignant neoplasm of cervix: Secondary | ICD-10-CM | POA: Insufficient documentation

## 2023-12-05 DIAGNOSIS — Z1151 Encounter for screening for human papillomavirus (HPV): Secondary | ICD-10-CM | POA: Insufficient documentation

## 2023-12-05 DIAGNOSIS — N9489 Other specified conditions associated with female genital organs and menstrual cycle: Secondary | ICD-10-CM | POA: Diagnosis not present

## 2023-12-05 DIAGNOSIS — Z01419 Encounter for gynecological examination (general) (routine) without abnormal findings: Secondary | ICD-10-CM | POA: Diagnosis not present

## 2023-12-05 DIAGNOSIS — N943 Premenstrual tension syndrome: Secondary | ICD-10-CM | POA: Diagnosis not present

## 2023-12-11 LAB — CYTOLOGY - PAP
Adequacy: ABSENT
Comment: NEGATIVE
Diagnosis: NEGATIVE
High risk HPV: NEGATIVE

## 2023-12-20 ENCOUNTER — Other Ambulatory Visit: Payer: Self-pay | Admitting: Nurse Practitioner

## 2023-12-21 ENCOUNTER — Other Ambulatory Visit (HOSPITAL_BASED_OUTPATIENT_CLINIC_OR_DEPARTMENT_OTHER): Payer: Self-pay

## 2023-12-22 ENCOUNTER — Other Ambulatory Visit: Payer: Self-pay | Admitting: Nurse Practitioner

## 2023-12-22 DIAGNOSIS — E66812 Obesity, class 2: Secondary | ICD-10-CM

## 2023-12-25 ENCOUNTER — Other Ambulatory Visit (HOSPITAL_BASED_OUTPATIENT_CLINIC_OR_DEPARTMENT_OTHER): Payer: Self-pay

## 2023-12-25 ENCOUNTER — Other Ambulatory Visit: Payer: Self-pay | Admitting: Nurse Practitioner

## 2023-12-26 ENCOUNTER — Encounter: Payer: Self-pay | Admitting: Nurse Practitioner

## 2023-12-26 ENCOUNTER — Other Ambulatory Visit (HOSPITAL_BASED_OUTPATIENT_CLINIC_OR_DEPARTMENT_OTHER): Payer: Self-pay

## 2023-12-26 DIAGNOSIS — E66812 Obesity, class 2: Secondary | ICD-10-CM

## 2023-12-26 MED ORDER — WEGOVY 2.4 MG/0.75ML ~~LOC~~ SOAJ
2.4000 mg | SUBCUTANEOUS | 0 refills | Status: DC
  Filled 2023-12-26: qty 3, 28d supply, fill #0

## 2023-12-26 NOTE — Telephone Encounter (Signed)
 Called patient and informed her that the refill was refused due to request is to soon. Patient stated that she used her last injection last Thursday and is due this Thursday and that her appointment is scheduled for next week 01/03/24 due to availability at time appointment was made. Informed her that I will send to [facility] to review.

## 2023-12-26 NOTE — Addendum Note (Signed)
 Addended by: KATHEEN ROSELIE CROME on: 12/26/2023 02:48 PM   Modules accepted: Orders

## 2023-12-27 ENCOUNTER — Other Ambulatory Visit (HOSPITAL_BASED_OUTPATIENT_CLINIC_OR_DEPARTMENT_OTHER): Payer: Self-pay

## 2023-12-28 ENCOUNTER — Telehealth: Payer: Self-pay

## 2023-12-28 ENCOUNTER — Other Ambulatory Visit (HOSPITAL_BASED_OUTPATIENT_CLINIC_OR_DEPARTMENT_OTHER): Payer: Self-pay

## 2023-12-28 NOTE — Telephone Encounter (Signed)
 Received a fax from CoverMyMeds for Wegovy  2.4 mg  Key: [facility] Patient Last Name: Vader DOB: 04/04/1981

## 2023-12-29 ENCOUNTER — Telehealth: Payer: Self-pay

## 2023-12-29 ENCOUNTER — Other Ambulatory Visit (HOSPITAL_COMMUNITY): Payer: Self-pay

## 2023-12-29 ENCOUNTER — Other Ambulatory Visit (HOSPITAL_BASED_OUTPATIENT_CLINIC_OR_DEPARTMENT_OTHER): Payer: Self-pay

## 2023-12-29 NOTE — Telephone Encounter (Addendum)
 Pharmacy Patient Advocate Encounter  Received notification from [facility] that Prior Authorization for WEGOVY  2.4/0.75ML has been APPROVED to 8.29.26. Ran test claim, Copay is $770.29. This test claim was processed through [facility]- copay amounts may vary at other pharmacies due to pharmacy/plan contracts, or as the patient moves through the different stages of their insurance plan.   PA #/Case ID/Reference #: [id number]

## 2023-12-29 NOTE — Telephone Encounter (Addendum)
 Called and spoke with patient to inform her of the copay cost per the pharmacist. She stated that she knows that she spoke with the pharmacist already. She stated that the tier form that was filled out and signed by Roselie before will need to be filled out and turn in to her insurance company again. She stated that she knows that she sent a message with a blank form and asked if I could print it out and have Roselie fill in and complete it again so she can get the medication. Upon review of her chart with patient messages the form was found and printed to be placed in provider folder to review and sign. She thanked me for calling and and for trying to get this taken care of in a timely manner.

## 2023-12-29 NOTE — Telephone Encounter (Signed)
 Called and spoke with patient to inform her of the copay cost per the pharmacist. She stated that she knows that she spoke with the pharmacist already. She stated that the tier form that was filled out and signed by Roselie before will need to be filled out and turn in to her insurance company again. She stated that she knows that she sent a message with a blank form and asked if I could print it out and have Roselie fill in and complete it again so she can get the medication

## 2023-12-29 NOTE — Telephone Encounter (Addendum)
 Called and spoke to Kiara Hall at patient's pharmacy to ask if they received the PA approval for the Wegovy . He stated that the patient will still have a high copay and that the copay is showing as $500 something. He stated that he will get it ready for pick up.

## 2023-12-29 NOTE — Telephone Encounter (Signed)
 Called patient's pharmacy and onformed why I was calling. Gerlene stated that the Rx was ready for pick and that the copay for patient is still high that it was $500 something dollars. I thanked him for taking my call and that I will make patient aware.

## 2024-01-03 ENCOUNTER — Encounter: Payer: Self-pay | Admitting: Nurse Practitioner

## 2024-01-03 ENCOUNTER — Ambulatory Visit: Payer: Self-pay | Admitting: Nurse Practitioner

## 2024-01-03 ENCOUNTER — Ambulatory Visit: Admitting: Nurse Practitioner

## 2024-01-03 ENCOUNTER — Other Ambulatory Visit (HOSPITAL_BASED_OUTPATIENT_CLINIC_OR_DEPARTMENT_OTHER): Payer: Self-pay

## 2024-01-03 ENCOUNTER — Other Ambulatory Visit (HOSPITAL_COMMUNITY): Payer: Self-pay

## 2024-01-03 VITALS — BP 122/78 | HR 82 | Temp 97.0°F | Ht 61.0 in | Wt 191.4 lb

## 2024-01-03 DIAGNOSIS — Z6836 Body mass index (BMI) 36.0-36.9, adult: Secondary | ICD-10-CM

## 2024-01-03 DIAGNOSIS — E559 Vitamin D deficiency, unspecified: Secondary | ICD-10-CM

## 2024-01-03 DIAGNOSIS — E66812 Obesity, class 2: Secondary | ICD-10-CM | POA: Diagnosis not present

## 2024-01-03 DIAGNOSIS — K219 Gastro-esophageal reflux disease without esophagitis: Secondary | ICD-10-CM

## 2024-01-03 LAB — VITAMIN D 25 HYDROXY (VIT D DEFICIENCY, FRACTURES): VITD: 30.54 ng/mL (ref 30.00–100.00)

## 2024-01-03 MED ORDER — PANTOPRAZOLE SODIUM 20 MG PO TBEC
20.0000 mg | DELAYED_RELEASE_TABLET | ORAL | 5 refills | Status: DC | PRN
  Filled 2024-01-03 – 2024-01-25 (×3): qty 30, 30d supply, fill #0

## 2024-01-03 NOTE — Assessment & Plan Note (Signed)
-   Repeat vit D today

## 2024-01-03 NOTE — Patient Instructions (Signed)
 Go to lab Maintain Heart healthy diet and daily exercise. Maintain current medications.

## 2024-01-03 NOTE — Progress Notes (Signed)
 Established Patient Visit  Patient: Kiara Hall   DOB: Jan 20, 1981   43 y.o. Female  MRN: [id number] Visit Date: 01/03/2024  Subjective:    Chief Complaint  Patient presents with   Follow-up    2 month follow up for weight management and anxiety- Anxiety is better  Refill needed for Protonix  and would like to discuss    HPI Obesity Diet: a day, struggles with high carb and high sugar meal replacement when meals are not planned Exercise: walking most daily of the week with her young children Lost 5lbs in last 2months Total weight loss: 11lbs in last 4months with wegovy  No adverse effects with current med dose Wt Readings from Last 3 Encounters:  01/03/24 191 lb 6 oz (86.8 kg)  11/02/23 196 lb 6.4 oz (89.1 kg)  10/05/23 202 lb 3.2 oz (91.7 kg)    Encouraged to maintain a high fiber and high protein diet, low carb/low sugar diet, daily exercise with resistant training at least 2x/week Maintain med dose F/up 2months  Vitamin D  deficiency Repeat vit D today  Gastroesophageal reflux disease Stable GI symptoms with pantoprazole  Refill sent  Wt Readings from Last 3 Encounters:  01/03/24 191 lb 6 oz (86.8 kg)  11/02/23 196 lb 6.4 oz (89.1 kg)  10/05/23 202 lb 3.2 oz (91.7 kg)    Reviewed medical, surgical, and social history today  Medications: Outpatient Medications Prior to Visit  Medication Sig   Ferrous Fumarate  324 MG TABS Take 1 tablet by mouth daily at 12 noon.   semaglutide -weight management (WEGOVY ) 2.4 MG/0.75ML SOAJ SQ injection Inject 2.4 mg into the skin once a week.   tranexamic acid  (LYSTEDA ) 650 MG TABS tablet Take 1,300 mg by mouth 3 (three) times daily.   Vitamin D , Ergocalciferol , (DRISDOL ) 1.25 MG (50000 UNIT) CAPS capsule Take 1 capsule (50,000 Units total) by mouth every 7 (seven) days.   [DISCONTINUED] pantoprazole  (PROTONIX ) 20 MG tablet Take 1 tablet (20 mg total) by mouth 2 (two) times daily before a meal. (Patient taking  differently: Take 20 mg by mouth as needed for heartburn or indigestion.)   No facility-administered medications prior to visit.   Reviewed past medical and social history.   ROS per HPI above  Last metabolic panel Lab Results  Component Value Date   GLUCOSE 91 10/05/2023   NA 139 10/05/2023   K 4.1 10/05/2023   CL 102 10/05/2023   CO2 27 10/05/2023   BUN 8 10/05/2023   CREATININE 0.60 10/05/2023   GFR 110.79 10/05/2023   CALCIUM 9.7 10/05/2023   PROT 7.9 10/05/2023   ALBUMIN 4.6 10/05/2023   BILITOT 0.5 10/05/2023   ALKPHOS 51 10/05/2023   AST 15 10/05/2023   ALT 10 10/05/2023   ANIONGAP 7 05/02/2023   Last lipids Lab Results  Component Value Date   CHOL 166 03/28/2023   HDL 50.60 03/28/2023   LDLCALC 106 (H) 03/28/2023   TRIG 47.0 03/28/2023   CHOLHDL 3 03/28/2023   Last hemoglobin A1c Lab Results  Component Value Date   HGBA1C 5.2 03/28/2023   Last thyroid  functions Lab Results  Component Value Date   TSH 1.31 10/05/2023   T3TOTAL 139.9 11/06/2007   Last vitamin D  Lab Results  Component Value Date   VD25OH 19.94 (L) 10/05/2023      Objective:  BP 122/78 (BP Location: Left Arm, Patient Position: Sitting, Cuff Size: Normal)   Pulse  82   Temp (!) 97 F (36.1 C) (Temporal)   Ht 5' 1 (1.549 m)   Wt 191 lb 6 oz (86.8 kg)   SpO2 97%   BMI 36.16 kg/m      Physical Exam Cardiovascular:     Rate and Rhythm: Normal rate.     Pulses: Normal pulses.  Pulmonary:     Effort: Pulmonary effort is normal.  Neurological:     Mental Status: She is alert and oriented to person, place, and time.     No results found for any visits on 01/03/24.    Assessment & Plan:    Problem List Items Addressed This Visit     Gastroesophageal reflux disease   Stable GI symptoms with pantoprazole  Refill sent      Relevant Medications   pantoprazole  (PROTONIX ) 20 MG tablet   Obesity - Primary   Diet: a day, struggles with high carb and high sugar meal  replacement when meals are not planned Exercise: walking most daily of the week with her young children Lost 5lbs in last 2months Total weight loss: 11lbs in last 4months with wegovy  No adverse effects with current med dose Wt Readings from Last 3 Encounters:  01/03/24 191 lb 6 oz (86.8 kg)  11/02/23 196 lb 6.4 oz (89.1 kg)  10/05/23 202 lb 3.2 oz (91.7 kg)    Encouraged to maintain a high fiber and high protein diet, low carb/low sugar diet, daily exercise with resistant training at least 2x/week Maintain med dose F/up 2months      Vitamin D  deficiency   Repeat vit D today      Relevant Orders   VITAMIN D  25 Hydroxy (Vit-D Deficiency, Fractures)   Return in about 2 months (around 03/04/2024) for Weight management, hyperlipidemia (fasting).     Roselie Mood, NP

## 2024-01-03 NOTE — Telephone Encounter (Signed)
 Tier Exception formed fax to [employer]

## 2024-01-03 NOTE — Assessment & Plan Note (Signed)
 Stable GI symptoms with pantoprazole  Refill sent

## 2024-01-03 NOTE — Assessment & Plan Note (Addendum)
 Diet: a day, struggles with high carb and high sugar meal replacement when meals are not planned Exercise: walking most daily of the week with her young children Lost 5lbs in last 2months Total weight loss: 11lbs in last 4months with wegovy  No adverse effects with current med dose Wt Readings from Last 3 Encounters:  01/03/24 191 lb 6 oz (86.8 kg)  11/02/23 196 lb 6.4 oz (89.1 kg)  10/05/23 202 lb 3.2 oz (91.7 kg)    Encouraged to maintain a high fiber and high protein diet, low carb/low sugar diet, daily exercise with resistant training at least 2x/week Maintain med dose F/up 2months

## 2024-01-12 ENCOUNTER — Other Ambulatory Visit (HOSPITAL_BASED_OUTPATIENT_CLINIC_OR_DEPARTMENT_OTHER): Payer: Self-pay

## 2024-01-15 ENCOUNTER — Encounter: Payer: Self-pay | Admitting: Nurse Practitioner

## 2024-01-15 ENCOUNTER — Ambulatory Visit: Admitting: Nurse Practitioner

## 2024-01-15 ENCOUNTER — Other Ambulatory Visit (HOSPITAL_BASED_OUTPATIENT_CLINIC_OR_DEPARTMENT_OTHER): Payer: Self-pay

## 2024-01-15 VITALS — BP 134/80 | HR 90 | Temp 98.3°F | Ht 61.0 in | Wt 191.0 lb

## 2024-01-15 DIAGNOSIS — J0140 Acute pansinusitis, unspecified: Secondary | ICD-10-CM | POA: Diagnosis not present

## 2024-01-15 DIAGNOSIS — J208 Acute bronchitis due to other specified organisms: Secondary | ICD-10-CM

## 2024-01-15 MED ORDER — DM-GUAIFENESIN ER 30-600 MG PO TB12: 1.0000 | ORAL_TABLET | Freq: Two times a day (BID) | ORAL | Status: DC | PRN

## 2024-01-15 MED ORDER — ALBUTEROL SULFATE HFA 108 (90 BASE) MCG/ACT IN AERS
1.0000 | INHALATION_SPRAY | Freq: Four times a day (QID) | RESPIRATORY_TRACT | 0 refills | Status: DC | PRN
  Filled 2024-01-15: qty 6.7, 30d supply, fill #0

## 2024-01-15 MED ORDER — AMOXICILLIN-POT CLAVULANATE 875-125 MG PO TABS
1.0000 | ORAL_TABLET | Freq: Two times a day (BID) | ORAL | 0 refills | Status: DC
  Filled 2024-01-15: qty 20, 10d supply, fill #0

## 2024-01-15 MED ORDER — PREDNISONE 10 MG (21) PO TBPK
ORAL_TABLET | ORAL | 0 refills | Status: DC
  Filled 2024-01-15: qty 21, 6d supply, fill #0

## 2024-01-15 NOTE — Progress Notes (Signed)
 Established Patient Visit  Patient: ANUSHKA HARTINGER   DOB: October 08, 1980   43 y.o. Female  MRN: [id number] Visit Date: 01/15/2024  Subjective:    Chief Complaint  Patient presents with   Cough    Started with a sore throat last Tuesday, runny nose, sneezing, cough with brownish yellow phlegm chest congestion some SOB, fatigue, and sinus pressure    Cough This is a new problem. The current episode started 1 to 4 weeks ago. The problem has been waxing and waning. The problem occurs constantly. The cough is Productive of sputum and productive of purulent sputum. Associated symptoms include chest pain, chills, ear congestion, ear pain, headaches, myalgias, nasal congestion, postnasal drip, rhinorrhea, a sore throat and shortness of breath. Pertinent negatives include no fever, heartburn, hemoptysis, rash, sweats, weight loss or wheezing. The symptoms are aggravated by lying down and exercise. She has tried OTC cough suppressant for the symptoms. The treatment provided no relief. There is no history of asthma, bronchiectasis, bronchitis, COPD, emphysema, environmental allergies or pneumonia.   No problem-specific Assessment & Plan notes found for this encounter.  BP Readings from Last 3 Encounters:  01/15/24 134/80  01/03/24 122/78  11/02/23 136/82    Reviewed medical, surgical, and social history today  Medications: Outpatient Medications Prior to Visit  Medication Sig   Ferrous Fumarate  324 MG TABS Take 1 tablet by mouth daily at 12 noon.   pantoprazole  (PROTONIX ) 20 MG tablet Take 1 tablet (20 mg total) by mouth as needed for heartburn or indigestion.   semaglutide -weight management (WEGOVY ) 2.4 MG/0.75ML SOAJ SQ injection Inject 2.4 mg into the skin once a week.   tranexamic acid  (LYSTEDA ) 650 MG TABS tablet Take 1,300 mg by mouth 3 (three) times daily.   [DISCONTINUED] Vitamin D , Ergocalciferol , (DRISDOL ) 1.25 MG (50000 UNIT) CAPS capsule Take 1 capsule (50,000 Units total)  by mouth every 7 (seven) days.   No facility-administered medications prior to visit.   Reviewed past medical and social history.   ROS per HPI above      Objective:  BP 134/80 (BP Location: Left Arm, Patient Position: Sitting, Cuff Size: Large)   Pulse 90   Temp 98.3 F (36.8 C) (Oral)   Ht 5' 1 (1.549 m)   Wt 191 lb (86.6 kg)   LMP 01/06/2024   SpO2 98%   BMI 36.09 kg/m      Physical Exam Constitutional:      General: She is not in acute distress. HENT:     Right Ear: Ear canal and external ear normal.     Left Ear: Ear canal and external ear normal.     Nose: Congestion and rhinorrhea present. No nasal tenderness or mucosal edema.     Right Nostril: No occlusion.     Left Nostril: No occlusion.     Right Turbinates: Not enlarged, swollen or pale.     Left Turbinates: Not enlarged, swollen or pale.     Right Sinus: No maxillary sinus tenderness or frontal sinus tenderness.     Left Sinus: No maxillary sinus tenderness or frontal sinus tenderness.     Mouth/Throat:     Pharynx: Oropharynx is clear. Uvula midline. Postnasal drip present. No oropharyngeal exudate.     Tonsils: No tonsillar exudate or tonsillar abscesses.  Eyes:     Extraocular Movements: Extraocular movements intact.     Conjunctiva/sclera: Conjunctivae normal.  Cardiovascular:  Rate and Rhythm: Normal rate and regular rhythm.     Pulses: Normal pulses.     Heart sounds: Normal heart sounds.  Pulmonary:     Effort: Pulmonary effort is normal.     Breath sounds: Normal breath sounds.  Musculoskeletal:     Cervical back: Normal range of motion and neck supple.     Right lower leg: No edema.     Left lower leg: No edema.  Lymphadenopathy:     Cervical: No cervical adenopathy.  Neurological:     Mental Status: She is alert and oriented to person, place, and time.     No results found for any visits on 01/15/24.    Assessment & Plan:    Problem List Items Addressed This Visit    None Visit Diagnoses       Acute non-recurrent pansinusitis    -  Primary   Relevant Medications   amoxicillin -clavulanate (AUGMENTIN ) 875-125 MG tablet   dextromethorphan-guaiFENesin  (MUCINEX  DM) 30-600 MG 12hr tablet   predniSONE  (STERAPRED UNI-PAK 21 TAB) 10 MG (21) TBPK tablet   albuterol  (VENTOLIN  HFA) 108 (90 Base) MCG/ACT inhaler     Acute bronchitis due to other specified organisms       Relevant Medications   amoxicillin -clavulanate (AUGMENTIN ) 875-125 MG tablet   dextromethorphan-guaiFENesin  (MUCINEX  DM) 30-600 MG 12hr tablet   predniSONE  (STERAPRED UNI-PAK 21 TAB) 10 MG (21) TBPK tablet   albuterol  (VENTOLIN  HFA) 108 (90 Base) MCG/ACT inhaler      Return if symptoms worsen or fail to improve.     Roselie Mood, NP

## 2024-01-15 NOTE — Patient Instructions (Signed)
 Stop all oral decongestant Maintain adequate oral hydration Ok to use mucinex  Dm (guafenesin dextromethorphan) or mucinex  (guaifenesin )

## 2024-01-25 ENCOUNTER — Other Ambulatory Visit: Payer: Self-pay | Admitting: Nurse Practitioner

## 2024-01-26 ENCOUNTER — Other Ambulatory Visit (HOSPITAL_BASED_OUTPATIENT_CLINIC_OR_DEPARTMENT_OTHER): Payer: Self-pay

## 2024-01-26 ENCOUNTER — Other Ambulatory Visit: Payer: Self-pay

## 2024-01-29 ENCOUNTER — Other Ambulatory Visit (HOSPITAL_BASED_OUTPATIENT_CLINIC_OR_DEPARTMENT_OTHER): Payer: Self-pay

## 2024-01-29 MED ORDER — WEGOVY 2.4 MG/0.75ML ~~LOC~~ SOAJ
2.4000 mg | SUBCUTANEOUS | 0 refills | Status: DC
  Filled 2024-01-29: qty 3, 28d supply, fill #0

## 2024-02-02 ENCOUNTER — Encounter: Payer: Self-pay | Admitting: Oncology

## 2024-02-02 ENCOUNTER — Telehealth: Payer: Self-pay | Admitting: Oncology

## 2024-02-02 NOTE — Telephone Encounter (Signed)
Rescheduled appt confirmed with PT

## 2024-02-04 ENCOUNTER — Encounter: Payer: Self-pay | Admitting: Nurse Practitioner

## 2024-02-05 ENCOUNTER — Inpatient Hospital Stay: Admitting: Oncology

## 2024-02-05 ENCOUNTER — Other Ambulatory Visit (HOSPITAL_BASED_OUTPATIENT_CLINIC_OR_DEPARTMENT_OTHER): Payer: Self-pay

## 2024-02-05 ENCOUNTER — Other Ambulatory Visit: Payer: Self-pay | Admitting: Nurse Practitioner

## 2024-02-05 ENCOUNTER — Other Ambulatory Visit

## 2024-02-05 MED ORDER — PANTOPRAZOLE SODIUM 40 MG PO TBEC
40.0000 mg | DELAYED_RELEASE_TABLET | Freq: Every day | ORAL | 1 refills | Status: DC
  Filled 2024-02-05: qty 30, 30d supply, fill #0

## 2024-02-22 ENCOUNTER — Other Ambulatory Visit (HOSPITAL_BASED_OUTPATIENT_CLINIC_OR_DEPARTMENT_OTHER): Payer: Self-pay

## 2024-02-22 ENCOUNTER — Other Ambulatory Visit: Payer: Self-pay | Admitting: Nurse Practitioner

## 2024-02-22 DIAGNOSIS — Z6837 Body mass index (BMI) 37.0-37.9, adult: Secondary | ICD-10-CM

## 2024-02-23 ENCOUNTER — Other Ambulatory Visit (HOSPITAL_BASED_OUTPATIENT_CLINIC_OR_DEPARTMENT_OTHER): Payer: Self-pay

## 2024-02-23 MED ORDER — WEGOVY 2.4 MG/0.75ML ~~LOC~~ SOAJ
2.4000 mg | SUBCUTANEOUS | 0 refills | Status: DC
  Filled 2024-02-23: qty 3, 28d supply, fill #0

## 2024-02-26 ENCOUNTER — Inpatient Hospital Stay: Admitting: Oncology

## 2024-02-26 ENCOUNTER — Telehealth: Payer: Self-pay | Admitting: Oncology

## 2024-02-26 ENCOUNTER — Inpatient Hospital Stay

## 2024-02-26 NOTE — Telephone Encounter (Signed)
 PT needs to cancel appt. will call back to reschedule.

## 2024-03-04 ENCOUNTER — Other Ambulatory Visit (HOSPITAL_BASED_OUTPATIENT_CLINIC_OR_DEPARTMENT_OTHER): Payer: Self-pay

## 2024-03-04 ENCOUNTER — Encounter: Payer: Self-pay | Admitting: Nurse Practitioner

## 2024-03-04 ENCOUNTER — Ambulatory Visit: Admitting: Nurse Practitioner

## 2024-03-04 ENCOUNTER — Ambulatory Visit: Payer: Self-pay | Admitting: Nurse Practitioner

## 2024-03-04 VITALS — BP 126/82 | HR 87 | Temp 96.1°F | Ht 61.0 in | Wt 188.0 lb

## 2024-03-04 DIAGNOSIS — E7800 Pure hypercholesterolemia, unspecified: Secondary | ICD-10-CM | POA: Diagnosis not present

## 2024-03-04 DIAGNOSIS — K219 Gastro-esophageal reflux disease without esophagitis: Secondary | ICD-10-CM | POA: Diagnosis not present

## 2024-03-04 DIAGNOSIS — D500 Iron deficiency anemia secondary to blood loss (chronic): Secondary | ICD-10-CM

## 2024-03-04 DIAGNOSIS — Z6837 Body mass index (BMI) 37.0-37.9, adult: Secondary | ICD-10-CM

## 2024-03-04 DIAGNOSIS — E66812 Obesity, class 2: Secondary | ICD-10-CM | POA: Diagnosis not present

## 2024-03-04 DIAGNOSIS — Z6835 Body mass index (BMI) 35.0-35.9, adult: Secondary | ICD-10-CM

## 2024-03-04 LAB — LIPID PANEL
Cholesterol: 153 mg/dL (ref 0–200)
HDL: 65.1 mg/dL (ref >39.00)
LDL Cholesterol: 78 mg/dL (ref 0–99)
NonHDL: 88.14
Total CHOL/HDL Ratio: 2
Triglycerides: 52 mg/dL (ref 0.0–149.0)
VLDL: 10.4 mg/dL (ref 0.0–40.0)

## 2024-03-04 LAB — CBC WITH DIFFERENTIAL/PLATELET
Basophils Absolute: 0 K/uL (ref 0.0–0.1)
Basophils Relative: 1.2 % (ref 0.0–3.0)
Eosinophils Absolute: 0.1 K/uL (ref 0.0–0.7)
Eosinophils Relative: 2.6 % (ref 0.0–5.0)
HCT: 29.8 % — ABNORMAL LOW (ref 36.0–46.0)
Hemoglobin: 9.4 g/dL — ABNORMAL LOW (ref 12.0–15.0)
Lymphocytes Relative: 34.7 % (ref 12.0–46.0)
Lymphs Abs: 1.2 K/uL (ref 0.7–4.0)
MCHC: 31.6 g/dL (ref 30.0–36.0)
MCV: 67.5 fl — ABNORMAL LOW (ref 78.0–100.0)
Monocytes Absolute: 0.4 K/uL (ref 0.1–1.0)
Monocytes Relative: 11.3 % (ref 3.0–12.0)
Neutro Abs: 1.7 K/uL (ref 1.4–7.7)
Neutrophils Relative %: 50.2 % (ref 43.0–77.0)
Platelets: 276 K/uL (ref 150.0–400.0)
RBC: 4.41 Mil/uL (ref 3.87–5.11)
RDW: 21.7 % — ABNORMAL HIGH (ref 11.5–15.5)
WBC: 3.4 K/uL — ABNORMAL LOW (ref 4.0–10.5)

## 2024-03-04 LAB — RENAL FUNCTION PANEL
Albumin: 4 g/dL (ref 3.5–5.2)
BUN: 10 mg/dL (ref 6–23)
CO2: 28 meq/L (ref 19–32)
Calcium: 8.7 mg/dL (ref 8.4–10.5)
Chloride: 103 meq/L (ref 96–112)
Creatinine, Ser: 0.59 mg/dL (ref 0.40–1.20)
GFR: 110.92 mL/min (ref >60.00)
Glucose, Bld: 85 mg/dL (ref 70–99)
Phosphorus: 3.1 mg/dL (ref 2.3–4.6)
Potassium: 4.1 meq/L (ref 3.5–5.1)
Sodium: 138 meq/L (ref 135–145)

## 2024-03-04 MED ORDER — PANTOPRAZOLE SODIUM 20 MG PO TBEC
20.0000 mg | DELAYED_RELEASE_TABLET | Freq: Every day | ORAL | 1 refills | Status: DC
  Filled 2024-03-04: qty 90, 90d supply, fill #0

## 2024-03-04 MED ORDER — WEGOVY 2.4 MG/0.75ML ~~LOC~~ SOAJ
2.4000 mg | SUBCUTANEOUS | 2 refills | Status: DC
  Filled 2024-03-04 – 2024-03-23 (×2): qty 3, 28d supply, fill #0
  Filled 2024-04-15: qty 3, 28d supply, fill #1

## 2024-03-04 NOTE — Progress Notes (Signed)
 Established Patient Visit  Patient: Kiara Hall   DOB: 01/13/1981   43 y.o. Female  MRN: [id number] Visit Date: 03/04/2024  Subjective:    Chief Complaint  Patient presents with   Weight Management Screening    Pt presents today for a 2 month weigt follow up. Pt wants to have bloodwork done has been fasting. Pt also wants to discuss coming off the pantoprazole  (PROTONIX ) 20 MG tablet says its cause weird chest pain   HPI Obesity Exercise: limited due to change in weather and stressor related to work. Diet: 2-81meals a day, high protein and high fiber. Lost 3lbs in last 1.84month Total weight loss in last 5months 14lbs Wt Readings from Last 3 Encounters:  03/04/24 188 lb (85.3 kg)  01/15/24 191 lb (86.6 kg)  01/03/24 191 lb 6 oz (86.8 kg)    Encouraged to do home video exercises Maintain heart healthy diet Maintain med dose Repeat BMP F/up in 2-42months  Iron  deficiency anemia due to chronic blood loss Repeat cbc and iron  panel  Hypercholesterolemia Repeat lipid panel Encouraged to maintain a heart healthy diet and daily exercise  Gastroesophageal reflux disease Opted to decrease pantoprazole  dose to mg daily Reports increased GERDs symptoms due to increased stress, but unable to tolerate 40mg   Sent pantoprazole  20mg , but advised to f/up with GI    Wt Readings from Last 3 Encounters:  03/04/24 188 lb (85.3 kg)  01/15/24 191 lb (86.6 kg)  01/03/24 191 lb 6 oz (86.8 kg)    Reviewed medical, surgical, and social history today  Medications: Outpatient Medications Prior to Visit  Medication Sig   dextromethorphan-guaiFENesin  (MUCINEX  DM) 30-600 MG 12hr tablet Take 1 tablet by mouth 2 (two) times daily as needed for cough.   Ferrous Fumarate  324 MG TABS Take 1 tablet by mouth daily at 12 noon.   tranexamic acid  (LYSTEDA ) 650 MG TABS tablet Take 1,300 mg by mouth 3 (three) times daily.   [DISCONTINUED] pantoprazole  (PROTONIX ) 20 MG tablet Take 1 tablet  (20 mg total) by mouth as needed for heartburn or indigestion.   [DISCONTINUED] pantoprazole  (PROTONIX ) 40 MG tablet Take 1 tablet (40 mg total) by mouth daily.   [DISCONTINUED] semaglutide -weight management (WEGOVY ) 2.4 MG/0.75ML SOAJ SQ injection Inject 2.4 mg into the skin once a week.   No facility-administered medications prior to visit.   Reviewed past medical and social history.   ROS per HPI above      Objective:  BP 126/82   Pulse 87   Temp (!) 96.1 F (35.6 C)   Ht 5' 1 (1.549 m)   Wt 188 lb (85.3 kg)   SpO2 99%   BMI 35.52 kg/m      Physical Exam Cardiovascular:     Rate and Rhythm: Normal rate.     Pulses: Normal pulses.  Pulmonary:     Effort: Pulmonary effort is normal.  Neurological:     Mental Status: She is alert and oriented to person, place, and time.  Psychiatric:        Mood and Affect: Mood normal.        Behavior: Behavior normal.        Thought Content: Thought content normal.     Results for orders placed or performed in visit on 03/04/24  Lipid panel  Result Value Ref Range   Cholesterol 153 0 - 200 mg/dL   Triglycerides 47.9 0.0 - 149.0 mg/dL  HDL 65.10 >39.00 mg/dL   VLDL 89.5 0.0 - 59.9 mg/dL   LDL Cholesterol 78 0 - 99 mg/dL   Total CHOL/HDL Ratio 2    NonHDL 88.14   Renal Function Panel  Result Value Ref Range   Sodium 138 135 - 145 mEq/L   Potassium 4.1 3.5 - 5.1 mEq/L   Chloride 103 96 - 112 mEq/L   CO2 28 19 - 32 mEq/L   Albumin 4.0 3.5 - 5.2 g/dL   BUN 10 6 - 23 mg/dL   Creatinine, Ser 9.40 0.40 - 1.20 mg/dL   Glucose, Bld 85 70 - 99 mg/dL   Phosphorus 3.1 2.3 - 4.6 mg/dL   GFR 889.07 >39.99 mL/min   Calcium 8.7 8.4 - 10.5 mg/dL  CBC with Differential/Platelet  Result Value Ref Range   WBC 3.4 (L) 4.0 - 10.5 K/uL   RBC 4.41 3.87 - 5.11 Mil/uL   Hemoglobin 9.4 (L) 12.0 - 15.0 g/dL   HCT 70.1 (L) 63.9 - 53.9 %   MCV 67.5 Repeated and verified X2. (L) 78.0 - 100.0 fl   MCHC 31.6 30.0 - 36.0 g/dL   RDW 78.2 (H)  88.4 - 15.5 %   Platelets 276.0 150.0 - 400.0 K/uL   Neutrophils Relative % 50.2 43.0 - 77.0 %   Lymphocytes Relative 34.7 12.0 - 46.0 %   Monocytes Relative 11.3 3.0 - 12.0 %   Eosinophils Relative 2.6 0.0 - 5.0 %   Basophils Relative 1.2 0.0 - 3.0 %   Neutro Abs 1.7 1.4 - 7.7 K/uL   Lymphs Abs 1.2 0.7 - 4.0 K/uL   Monocytes Absolute 0.4 0.1 - 1.0 K/uL   Eosinophils Absolute 0.1 0.0 - 0.7 K/uL   Basophils Absolute 0.0 0.0 - 0.1 K/uL      Assessment & Plan:    Problem List Items Addressed This Visit     Gastroesophageal reflux disease   Opted to decrease pantoprazole  dose to mg daily Reports increased GERDs symptoms due to increased stress, but unable to tolerate 40mg   Sent pantoprazole  20mg , but advised to f/up with GI        Relevant Medications   pantoprazole  (PROTONIX ) 20 MG tablet   Hypercholesterolemia   Repeat lipid panel Encouraged to maintain a heart healthy diet and daily exercise      Relevant Orders   Lipid panel (Completed)   Iron  deficiency anemia due to chronic blood loss (Chronic)   Repeat cbc and iron  panel      Relevant Orders   CBC with Differential/Platelet (Completed)   Iron , TIBC and Ferritin Panel   Obesity - Primary   Exercise: limited due to change in weather and stressor related to work. Diet: 2-43meals a day, high protein and high fiber. Lost 3lbs in last 1.6month Total weight loss in last 5months 14lbs Wt Readings from Last 3 Encounters:  03/04/24 188 lb (85.3 kg)  01/15/24 191 lb (86.6 kg)  01/03/24 191 lb 6 oz (86.8 kg)    Encouraged to do home video exercises Maintain heart healthy diet Maintain med dose Repeat BMP F/up in 2-52months      Relevant Medications   semaglutide -weight management (WEGOVY ) 2.4 MG/0.75ML SOAJ SQ injection   Other Relevant Orders   Renal Function Panel (Completed)   Return in about 3 months (around 06/04/2024) for Weight management, hyperlipidemia (fasting).     Roselie Mood, NP

## 2024-03-04 NOTE — Assessment & Plan Note (Signed)
 Opted to decrease pantoprazole  dose to mg daily Reports increased GERDs symptoms due to increased stress, but unable to tolerate 40mg   Sent pantoprazole  20mg , but advised to f/up with GI

## 2024-03-04 NOTE — Assessment & Plan Note (Signed)
 Repeat lipid panel Encouraged to maintain a heart healthy diet and daily exercise

## 2024-03-04 NOTE — Assessment & Plan Note (Signed)
Repeat cbc and iron panel

## 2024-03-04 NOTE — Assessment & Plan Note (Addendum)
 Exercise: limited due to change in weather and stressor related to work. Diet: 2-27meals a day, high protein and high fiber. Lost 3lbs in last 1.63month Total weight loss in last 5months 14lbs Wt Readings from Last 3 Encounters:  03/04/24 188 lb (85.3 kg)  01/15/24 191 lb (86.6 kg)  01/03/24 191 lb 6 oz (86.8 kg)    Encouraged to do home video exercises Maintain heart healthy diet Maintain med dose Repeat BMP F/up in 2-80months

## 2024-03-04 NOTE — Patient Instructions (Signed)
 Go to lab Maintain current med dose Use home exercise videos

## 2024-03-05 LAB — IRON,TIBC AND FERRITIN PANEL
%SAT: 6 % — ABNORMAL LOW (ref 16–45)
Ferritin: 5 ng/mL — ABNORMAL LOW (ref 16–232)
Iron: 23 ug/dL — ABNORMAL LOW (ref 40–190)
TIBC: 367 ug/dL (ref 250–450)

## 2024-03-11 ENCOUNTER — Other Ambulatory Visit (HOSPITAL_BASED_OUTPATIENT_CLINIC_OR_DEPARTMENT_OTHER): Payer: Self-pay

## 2024-03-12 ENCOUNTER — Other Ambulatory Visit (HOSPITAL_BASED_OUTPATIENT_CLINIC_OR_DEPARTMENT_OTHER): Payer: Self-pay

## 2024-03-14 ENCOUNTER — Other Ambulatory Visit (HOSPITAL_BASED_OUTPATIENT_CLINIC_OR_DEPARTMENT_OTHER): Payer: Self-pay

## 2024-03-20 DIAGNOSIS — K080 Exfoliation of teeth due to systemic causes: Secondary | ICD-10-CM | POA: Diagnosis not present

## 2024-03-22 ENCOUNTER — Other Ambulatory Visit (HOSPITAL_BASED_OUTPATIENT_CLINIC_OR_DEPARTMENT_OTHER): Payer: Self-pay

## 2024-03-22 DIAGNOSIS — R1013 Epigastric pain: Secondary | ICD-10-CM | POA: Diagnosis not present

## 2024-03-25 ENCOUNTER — Other Ambulatory Visit (HOSPITAL_BASED_OUTPATIENT_CLINIC_OR_DEPARTMENT_OTHER): Payer: Self-pay

## 2024-03-26 DIAGNOSIS — R1013 Epigastric pain: Secondary | ICD-10-CM | POA: Diagnosis not present

## 2024-03-26 DIAGNOSIS — R079 Chest pain, unspecified: Secondary | ICD-10-CM | POA: Diagnosis not present

## 2024-04-11 DIAGNOSIS — Z30430 Encounter for insertion of intrauterine contraceptive device: Secondary | ICD-10-CM | POA: Diagnosis not present

## 2024-04-11 DIAGNOSIS — Z3202 Encounter for pregnancy test, result negative: Secondary | ICD-10-CM | POA: Diagnosis not present

## 2024-04-12 ENCOUNTER — Ambulatory Visit: Payer: Self-pay

## 2024-04-12 DIAGNOSIS — R110 Nausea: Secondary | ICD-10-CM | POA: Diagnosis not present

## 2024-04-12 DIAGNOSIS — G4489 Other headache syndrome: Secondary | ICD-10-CM | POA: Diagnosis not present

## 2024-04-12 DIAGNOSIS — R42 Dizziness and giddiness: Secondary | ICD-10-CM | POA: Diagnosis not present

## 2024-04-12 DIAGNOSIS — R5383 Other fatigue: Secondary | ICD-10-CM | POA: Diagnosis not present

## 2024-04-12 NOTE — Telephone Encounter (Signed)
 FYI Only or Action Required?: FYI only for provider: appointment scheduled on 04/15/24.  Patient was last seen in primary care on 03/04/2024 by Nche, Roselie Rockford, NP.  Called Nurse Triage reporting Dizziness.  Symptoms began a week ago.  Interventions attempted: OTC medications: Sudafed, Claritin ,Flonase .  Symptoms are: stable.  Triage Disposition: See Physician Within 24 Hours  Patient/caregiver understands and will follow disposition?: Yes    Copied from CRM #[id number]. Topic: Clinical - Red Word Triage >> Apr 12, 2024 11:09 AM Drema MATSU wrote: Red Word that prompted transfer to Nurse Triage: Patient is having sinus issues. Symptoms: head congestion, balance is off, and headaches.   ----------------------------------------------------------------------- From previous Reason for Contact - Scheduling: Patient/patient representative is calling to schedule an appointment. Refer to attachments for appointment information. >> Apr 12, 2024 11:24 AM Drema MATSU wrote: Pt did not want to hold. Please call patient back. Reason for Disposition  [1] MODERATE dizziness (e.g., interferes with normal activities) AND [2] has NOT been evaluated by doctor (or NP/PA) for this  (Exception: Dizziness caused by heat exposure, sudden standing, or poor fluid intake.)  Answer Assessment - Initial Assessment Questions 1. DESCRIPTION: Describe your dizziness.     Jerking from left to right 2. LIGHTHEADED: Do you feel lightheaded? (e.g., somewhat faint, woozy, weak upon standing)     No 3. VERTIGO: Do you feel like either you or the room is spinning or tilting? (i.e., vertigo)     No 4. SEVERITY: How bad is it?  Do you feel like you are going to faint? Can you stand and walk?     Pt notes she sometimes has to pause to regain her balance 5. ONSET:  When did the dizziness begin?     X 1 week 6. AGGRAVATING FACTORS: Does anything make it worse? (e.g., standing, change in head  position)     Sitting 7. HEART RATE: Can you tell me your heart rate? How many beats in 15 seconds?  (Note: Not all patients can do this.)       None 8. CAUSE: What do you think is causing the dizziness? (e.g., decreased fluids or food, diarrhea, emotional distress, heat exposure, new medicine, sudden standing, vomiting; unknown)     Pt believe sinus infection 10. OTHER SYMPTOMS: Do you have any other symptoms? (e.g., fever, chest pain, vomiting, diarrhea, bleeding)       L ear pain, pressure, popping  Protocols used: Dizziness - Lightheadedness-A-AH

## 2024-04-15 ENCOUNTER — Ambulatory Visit: Admitting: Emergency Medicine

## 2024-04-16 ENCOUNTER — Other Ambulatory Visit (HOSPITAL_BASED_OUTPATIENT_CLINIC_OR_DEPARTMENT_OTHER): Payer: Self-pay

## 2024-04-16 ENCOUNTER — Other Ambulatory Visit: Payer: Self-pay

## 2024-04-20 ENCOUNTER — Emergency Department (HOSPITAL_BASED_OUTPATIENT_CLINIC_OR_DEPARTMENT_OTHER)

## 2024-04-20 ENCOUNTER — Other Ambulatory Visit: Payer: Self-pay

## 2024-04-20 ENCOUNTER — Emergency Department (HOSPITAL_BASED_OUTPATIENT_CLINIC_OR_DEPARTMENT_OTHER)
Admission: EM | Admit: 2024-04-20 | Discharge: 2024-04-20 | Disposition: A | Discharge status: Home / Self Care | Attending: Emergency Medicine | Admitting: Emergency Medicine

## 2024-04-20 ENCOUNTER — Encounter (HOSPITAL_BASED_OUTPATIENT_CLINIC_OR_DEPARTMENT_OTHER): Payer: Self-pay

## 2024-04-20 DIAGNOSIS — R519 Headache, unspecified: Secondary | ICD-10-CM | POA: Diagnosis not present

## 2024-04-20 LAB — RESP PANEL BY RT-PCR (RSV, FLU A&B, COVID)  RVPGX2
Influenza A by PCR: NEGATIVE
Influenza B by PCR: NEGATIVE
Resp Syncytial Virus by PCR: NEGATIVE
SARS Coronavirus 2 by RT PCR: NEGATIVE

## 2024-04-20 NOTE — ED Provider Notes (Signed)
 " [place] EMERGENCY DEPARTMENT AT [facility] Provider Note   CSN: [id number] Arrival date & time: 04/20/24  9092     Patient presents with: Headache   Kiara Hall is a 43 y.o. female.   Patient presents to the emergency department for evaluation of head pain.  Patient states that about 2 weeks ago she developed pain in her posterior head.  She feels this every day.  She feels that her balance is off and that I have something neural going on.  She is afraid that she is going to pass out in front of her kids.  She states it feels like her head and feet aren't connected.  No confusion or vomiting.  Patient saw an urgent care we did an exam and recommended decongestants.  Patient has also tried Sudafed, a muscle relaxer thinking that pain was coming from her neck.  She tells me that she has never had symptoms like this in the past.  She is concerned that there is something going on in her head.  She reports being a mom to 3 children.        Prior to Admission medications  Medication Sig Start Date End Date Taking? Authorizing Provider  dextromethorphan-guaiFENesin  (MUCINEX  DM) 30-600 MG 12hr tablet Take 1 tablet by mouth 2 (two) times daily as needed for cough. 01/15/24   Nche, Roselie Rockford, NP  Ferrous Fumarate  324 MG TABS Take 1 tablet by mouth daily at 12 noon. 05/02/23   Pasam, Chinita, MD  pantoprazole  (PROTONIX ) 20 MG tablet Take 1 tablet (20 mg total) by mouth daily. 03/04/24   Nche, Roselie Rockford, NP  semaglutide -weight management (WEGOVY ) 2.4 MG/0.75ML SOAJ SQ injection Inject 2.4 mg into the skin once a week. 03/04/24   Nche, Roselie Rockford, NP  tranexamic acid  (LYSTEDA ) 650 MG TABS tablet Take 1,300 mg by mouth 3 (three) times daily. 06/04/23   [provider]    Allergies: Metronidazole    Review of Systems  Updated Vital Signs BP (!) 141/94 (BP Location: Right Arm)   Pulse (!) 111   Temp 98.2 F (36.8 C)   Resp 15   LMP 04/18/2024 (Exact Date)    SpO2 100%   Physical Exam Vitals and nursing note reviewed.  Constitutional:      Appearance: She is well-developed.  HENT:     Head: Normocephalic and atraumatic.     Right Ear: Ear canal and external ear normal. A middle ear effusion (Air-fluid levels noted) is present.     Left Ear: Tympanic membrane, ear canal and external ear normal.     Nose: Nose normal.     Mouth/Throat:     Pharynx: Uvula midline.  Eyes:     General: Lids are normal.     Extraocular Movements:     Right eye: No nystagmus.     Left eye: No nystagmus.     Conjunctiva/sclera: Conjunctivae normal.     Pupils: Pupils are equal, round, and reactive to light.  Cardiovascular:     Rate and Rhythm: Normal rate and regular rhythm.  Pulmonary:     Effort: Pulmonary effort is normal.     Breath sounds: Normal breath sounds.  Abdominal:     Palpations: Abdomen is soft.     Tenderness: There is no abdominal tenderness.  Musculoskeletal:     Cervical back: Normal range of motion and neck supple. No tenderness or bony tenderness.  Skin:    General: Skin is warm and dry.  Neurological:     Mental Status: She is alert and oriented to person, place, and time.     GCS: GCS eye subscore is 4. GCS verbal subscore is 5. GCS motor subscore is 6.     Cranial Nerves: No cranial nerve deficit.     Sensory: No sensory deficit.     Motor: No weakness.     Coordination: Coordination normal.     Gait: Gait normal.     Comments: Upper extremity myotomes tested bilaterally:  C5 Shoulder abduction 5/5 C6 Elbow flexion/wrist extension 5/5 C7 Elbow extension 5/5 C8 Finger flexion 5/5 T1 Finger abduction 5/5  Lower extremity myotomes tested bilaterally: L2 Hip flexion 5/5 L3 Knee extension 5/5 L4 Ankle dorsiflexion 5/5 S1 Ankle plantar flexion 5/5   Psychiatric:        Mood and Affect: Mood is anxious.     (all labs ordered are listed, but only abnormal results are displayed) Labs Reviewed  RESP PANEL BY RT-PCR  (RSV, FLU A&B, COVID)  [id number]    EKG: None  Radiology: CT Head Wo Contrast Result Date: 04/20/2024 EXAM: CT HEAD WITHOUT CONTRAST 04/20/2024 09:40:28 AM TECHNIQUE: CT of the head was performed without the administration of intravenous contrast. Automated exposure control, iterative reconstruction, and/or weight based adjustment of the mA/kV was utilized to reduce the radiation dose to as low as reasonably achievable. COMPARISON: CT of the head dated 10/27/2021. CLINICAL HISTORY: posterior HA x 2 weeks, balance feels off FINDINGS: BRAIN AND VENTRICLES: No acute hemorrhage. No evidence of acute infarct. No hydrocephalus. No extra-axial collection. No mass effect or midline shift. Partially empty sella. ORBITS: No acute abnormality. SINUSES: No acute abnormality. SOFT TISSUES AND SKULL: No acute soft tissue abnormality. No skull fracture. IMPRESSION: 1. No acute intracranial abnormality. 2. Partially empty sella, which can be incidental; correlate clinically for idiopathic intracranial hypertension if indicated. Electronically signed by: Evalene Coho MD 04/20/2024 09:42 AM EST RP Workstation: [id number]     Procedures   Medications Ordered in the ED - No data to display  ED Course  Patient seen and examined. History obtained directly from patient.   Labs/EKG: Viral panel ordered in triage  Imaging: Ordered head CT  Medications/Fluids: None ordered  Most recent vital signs reviewed and are as follows: BP (!) 141/94 (BP Location: Right Arm)   Pulse (!) 111   Temp 98.2 F (36.8 C)   Resp 15   LMP 04/18/2024 (Exact Date)   SpO2 100%   Initial impression: Atypical headache and pressure, patient with vague balance issues, normal neurologic exam.  Will evaluate with head CT, consider neurology referral if reassuring.  9:54 AM Reassessment performed. Patient appears stable.  She seems reassured by the results.  We discussed incidental finding of partially empty sella, what this means,  and that it could be a normal finding as well.  This is something that neurology will consider during potential follow-up.  Imaging personally visualized and interpreted including: CT of the head, agree no acute findings such as bleeding or mass.  Reviewed pertinent lab work and imaging with patient at bedside. Questions answered.   Most current vital signs reviewed and are as follows: BP 136/83 (BP Location: Right Arm)   Pulse 88   Temp 98.2 F (36.8 C)   Resp 16   LMP 04/18/2024 (Exact Date)   SpO2 98%   Plan: Discharge to home.   Prescriptions written for: None  Other home care instructions discussed: OTC meds as needed  ED return  instructions discussed: Patient counseled to return if they have weakness in their arms or legs, slurred speech, trouble walking or talking, confusion, trouble with their balance, or if they have any other concerns. Patient verbalizes understanding and agrees with plan.   Follow-up instructions discussed: Patient encouraged to follow-up with neurology in the next 2 weeks.                                   Medical Decision Making Amount and/or Complexity of Data Reviewed Radiology: ordered.   In regards to the patient's headache, critical differentials were considered including subarachnoid hemorrhage, intracerebral hemorrhage, epidural/subdural hematoma, pituitary apoplexy, vertebral/carotid artery dissection, giant cell arteritis, central venous thrombosis, reversible cerebral vasoconstriction, acute angle closure glaucoma, idiopathic intracranial hypertension, bacterial meningitis, viral encephalitis, carbon monoxide poisoning, posterior reversible encephalopathy syndrome, pre-eclampsia.   Reg flag symptoms related to these causes were considered including systemic symptoms (fever, weight loss), neurologic symptoms (confusion, mental status change, vision change, associated seizure), acute or sudden thunderclap onset, patient age 34 or older with  new or progressive headache, patient of any age with first headache or change in headache pattern, pregnant or postpartum status, history of HIV or other immunocompromise, history of cancer, headache occurring with exertion, associated neck or shoulder pain, associated traumatic injury, concurrent use of anticoagulation, family history of spontaneous SAH, and concurrent drug use.    Other benign, more common causes of headache were considered including migraine, tension-type headache, cluster headache, referred pain from other cause such as sinus infection, dental pain, trigeminal neuralgia.   On exam, patient has a reassuring neuro exam including baseline mental status, no significant neck pain or meningeal signs, no signs of severe infection or fever.   Patient is extremely anxious which is likely exacerbating her symptoms however she does have reported balance symptoms and question of partially empty sella on head CT.  She would likely benefit from outpatient neurology evaluation for further workup.  This is placed on the patient's behalf.  In regards to IIH, no severe headache or vision changes which would require more emergent care or consultation.  The patient's vital signs, pertinent lab work and imaging were reviewed and interpreted as discussed in the ED course. Hospitalization was considered for further testing, treatments, or serial exams/observation. However as patient is well-appearing, has a stable exam over the course of their evaluation, and reassuring studies today, I do not feel that they warrant admission at this time. This plan was discussed with the patient who verbalizes agreement and comfort with this plan and seems reliable and able to return to the Emergency Department with worsening or changing symptoms.       Final diagnoses:  Acute nonintractable headache, unspecified headache type    ED Discharge Orders          Ordered    Ambulatory referral to Neurology        Comments: An appointment is requested in approximately: 2 weeks for patient with new posterior HA, subjective balance difficulties, partially empty sella on head CT   04/20/24 0951               Desiderio Chew, PA-C 04/20/24 1000    Ruthe Cornet, DO 04/20/24 1155  "

## 2024-04-20 NOTE — Discharge Instructions (Signed)
 Please read and follow all provided instructions.  Your diagnoses today include:  1. Acute nonintractable headache, unspecified headache type     Tests performed today include: CT of your head which did not show any major issues, does suggest a partially empty sella which can be a sign of increased pressure in the fluid in your brain, but can also be a normal finding.  This is something that can be evaluated by neurology. Vital signs. See below for your results today.   Medications:  None ordered  Take any prescribed medications only as directed.  Additional information:  Follow any educational materials contained in this packet.  You are having a headache. No specific cause was found today for your headache. It may have been a migraine or other cause of headache. Stress, anxiety, fatigue, and depression are common triggers for headaches.   Your headache today does not appear to be life-threatening or require hospitalization, but often the exact cause of headaches is not determined in the emergency department. Therefore, follow-up with your doctor is very important to find out what may have caused your headache and whether or not you need any further diagnostic testing or treatment.   Sometimes headaches can appear benign (not harmful), but then more serious symptoms can develop which should prompt an immediate re-evaluation by your doctor or the emergency department.  BE VERY CAREFUL not to take multiple medicines containing Tylenol  (also called acetaminophen ). Doing so can lead to an overdose which can damage your liver and cause liver failure and possibly death.   Follow-up instructions: Please follow-up with the neurology referral in the next couple of weeks for further evaluation.  Return instructions:  Please return to the Emergency Department if you experience worsening symptoms. Return if the medications do not resolve your headache, if it recurs, or if you have multiple  episodes of vomiting or cannot keep down fluids. Return if you have a change from the usual headache. RETURN IMMEDIATELY IF you: Develop a sudden, severe headache Develop confusion or become poorly responsive or faint Develop a fever above 100.70F or problem breathing Have a change in speech, vision, swallowing, or understanding Develop new weakness, numbness, tingling, incoordination in your arms or legs Have a seizure Please return if you have any other emergent concerns.  Additional Information:  Your vital signs today were: BP 136/83 (BP Location: Right Arm)   Pulse 88   Temp 98.2 F (36.8 C)   Resp 16   LMP 04/18/2024 (Exact Date)   SpO2 98%  If your blood pressure (BP) was elevated above 135/85 this visit, please have this repeated by your doctor within one month. --------------

## 2024-04-20 NOTE — ED Triage Notes (Signed)
 She c/o dizziness for a while now. She c/o headache with head pressure x ~1 week. She is ambulatory and in no distress. She denies fever.

## 2024-04-20 NOTE — ED Notes (Signed)
 Patient transported to CT

## 2024-04-29 ENCOUNTER — Telehealth: Payer: Self-pay

## 2024-04-29 NOTE — Telephone Encounter (Signed)
 Patient needs vitamin D  checked

## 2024-04-29 NOTE — Telephone Encounter (Signed)
 Copied from CRM [id number]. Topic: Clinical - Request for Lab/Test Order >> Apr 29, 2024  8:59 AM Kiara Hall wrote: Reason for CRM: Patient would like Kiara Hall to put in order for her to check her vitamin and iron /ferritin - please call patient when orders are in to schedule at [phone]

## 2024-05-01 ENCOUNTER — Emergency Department (HOSPITAL_BASED_OUTPATIENT_CLINIC_OR_DEPARTMENT_OTHER)

## 2024-05-01 ENCOUNTER — Emergency Department (HOSPITAL_BASED_OUTPATIENT_CLINIC_OR_DEPARTMENT_OTHER)
Admission: EM | Admit: 2024-05-01 | Discharge: 2024-05-01 | Disposition: A | Discharge status: Home / Self Care | Attending: Emergency Medicine | Admitting: Emergency Medicine

## 2024-05-01 ENCOUNTER — Other Ambulatory Visit (HOSPITAL_BASED_OUTPATIENT_CLINIC_OR_DEPARTMENT_OTHER): Payer: Self-pay

## 2024-05-01 ENCOUNTER — Other Ambulatory Visit: Payer: Self-pay

## 2024-05-01 ENCOUNTER — Encounter (HOSPITAL_BASED_OUTPATIENT_CLINIC_OR_DEPARTMENT_OTHER): Payer: Self-pay

## 2024-05-01 DIAGNOSIS — R0789 Other chest pain: Secondary | ICD-10-CM | POA: Insufficient documentation

## 2024-05-01 DIAGNOSIS — Z794 Long term (current) use of insulin: Secondary | ICD-10-CM | POA: Insufficient documentation

## 2024-05-01 DIAGNOSIS — R079 Chest pain, unspecified: Secondary | ICD-10-CM | POA: Diagnosis not present

## 2024-05-01 LAB — CBC
HCT: 40.1 % (ref 36.0–46.0)
Hemoglobin: 12.5 g/dL (ref 12.0–15.0)
MCH: 23.4 pg — ABNORMAL LOW (ref 26.0–34.0)
MCHC: 31.2 g/dL (ref 30.0–36.0)
MCV: 75.1 fL — ABNORMAL LOW (ref 80.0–100.0)
Platelets: 232 K/uL (ref 150–400)
RBC: 5.34 MIL/uL — ABNORMAL HIGH (ref 3.87–5.11)
RDW: 22.5 % — ABNORMAL HIGH (ref 11.5–15.5)
WBC: 3.8 K/uL — ABNORMAL LOW (ref 4.0–10.5)
nRBC: 0 % (ref 0.0–0.2)

## 2024-05-01 LAB — BASIC METABOLIC PANEL WITH GFR
Anion gap: 11 (ref 5–15)
BUN: 9 mg/dL (ref 6–20)
CO2: 24 mmol/L (ref 22–32)
Calcium: 9.6 mg/dL (ref 8.9–10.3)
Chloride: 103 mmol/L (ref 98–111)
Creatinine, Ser: 0.66 mg/dL (ref 0.44–1.00)
GFR, Estimated: >60 mL/min
Glucose, Bld: 103 mg/dL — ABNORMAL HIGH (ref 70–99)
Potassium: 4 mmol/L (ref 3.5–5.1)
Sodium: 137 mmol/L (ref 135–145)

## 2024-05-01 LAB — TROPONIN T, HIGH SENSITIVITY
Troponin T High Sensitivity: <15 ng/L (ref 0–19)
Troponin T High Sensitivity: <15 ng/L (ref 0–19)

## 2024-05-01 LAB — PREGNANCY, URINE: Preg Test, Ur: NEGATIVE

## 2024-05-01 MED ORDER — LACTATED RINGERS IV BOLUS
1000.0000 mL | Freq: Once | INTRAVENOUS | Status: AC
  Administered 2024-05-01: 1000 mL via INTRAVENOUS

## 2024-05-01 MED ORDER — KETOROLAC TROMETHAMINE 15 MG/ML IJ SOLN
15.0000 mg | Freq: Once | INTRAMUSCULAR | Status: AC
  Administered 2024-05-01: 15 mg via INTRAVENOUS
  Filled 2024-05-01: qty 1

## 2024-05-01 MED ORDER — HYDROXYZINE HCL 25 MG PO TABS
25.0000 mg | ORAL_TABLET | Freq: Four times a day (QID) | ORAL | 0 refills | Status: AC
  Filled 2024-05-01: qty 36, 9d supply, fill #0

## 2024-05-01 NOTE — ED Triage Notes (Signed)
 C/o intermittent left sided squeezing chest pain with episodes of dizziness. Mid back pain to left side. States has been having dizziness episodes for a while, but no hx of chest pain. States has had similar episodes when her vitamin D  was low. Started supplements yesterday.

## 2024-05-01 NOTE — ED Provider Notes (Signed)
 " [place] EMERGENCY DEPARTMENT AT [facility] Provider Note   CSN: [id number] Arrival date & time: 05/01/24  1139     Patient presents with: Chest Pain   Kiara Hall is a 43 y.o. female.    Chest Pain  Patient is a 43 year old female with past medical history significant for anxiety, palpitations, reflux, anemia  Patient presents emergency room today with complaints of crampy left-sided chest pain that she describes as intermittent over the past 5 days.  She endorses what she describes as a 5/10 pain that is a crampy.  Does not seem to be worse with breathing or exertion.  Seems to be worse after eating and she describes this as intermittent.  No other provoking factors.  She has taken no medications for her symptoms.  No recent surgeries, hospitalization, long travel, hemoptysis, estrogen containing OCP, cancer history.  No unilateral leg swelling.  No history of PE or VTE.      Prior to Admission medications  Medication Sig Start Date End Date Taking? Authorizing Provider  amoxicillin -clavulanate (AUGMENTIN ) 875-125 MG tablet Take 1 tablet by mouth 2 (two) times daily. 04/12/24  Yes [provider]  hydrOXYzine  (ATARAX ) 25 MG tablet Take 1 tablet (25 mg total) by mouth every 6 (six) hours for 9 days. 05/01/24 05/10/24 Yes Ja Pistole S, PA  dextromethorphan-guaiFENesin  (MUCINEX  DM) 30-600 MG 12hr tablet Take 1 tablet by mouth 2 (two) times daily as needed for cough. 01/15/24   Nche, Roselie Rockford, NP  Ferrous Fumarate  324 MG TABS Take 1 tablet by mouth daily at 12 noon. 05/02/23   Pasam, Chinita, MD  pantoprazole  (PROTONIX ) 20 MG tablet Take 1 tablet (20 mg total) by mouth daily. 03/04/24   Nche, Roselie Rockford, NP  semaglutide -weight management (WEGOVY ) 2.4 MG/0.75ML SOAJ SQ injection Inject 2.4 mg into the skin once a week. 03/04/24   Nche, Roselie Rockford, NP  tranexamic acid  (LYSTEDA ) 650 MG TABS tablet Take 1,300 mg by mouth 3 (three) times daily. 06/04/23    [provider]    Allergies: Augmentin  [amoxicillin -pot clavulanate] and Metronidazole    Review of Systems  Cardiovascular:  Positive for chest pain.    Updated Vital Signs BP 135/84 (BP Location: Right Arm)   Pulse 95   Temp 99.7 F (37.6 C) (Oral)   Resp 16   Ht 5' 1 (1.549 m)   Wt 83.9 kg   LMP 04/18/2024 (Exact Date)   SpO2 99%   BMI 34.96 kg/m   Physical Exam Vitals and nursing note reviewed.  Constitutional:      General: She is not in acute distress. HENT:     Head: Normocephalic and atraumatic.     Nose: Nose normal.     Mouth/Throat:     Mouth: Mucous membranes are dry.  Eyes:     General: No scleral icterus. Cardiovascular:     Rate and Rhythm: Normal rate and regular rhythm.     Pulses: Normal pulses.     Heart sounds: Normal heart sounds.  Pulmonary:     Effort: Pulmonary effort is normal. No respiratory distress.     Breath sounds: No wheezing.  Abdominal:     Palpations: Abdomen is soft.     Tenderness: There is no abdominal tenderness. There is no guarding or rebound.  Musculoskeletal:     Cervical back: Normal range of motion.     Right lower leg: No edema.     Left lower leg: No edema.  Skin:  General: Skin is warm and dry.     Capillary Refill: Capillary refill takes less than 2 seconds.  Neurological:     Mental Status: She is alert. Mental status is at baseline.     Comments: Alert and oriented to self, place, time and event.   Speech is fluent, clear without dysarthria or dysphasia.   Strength 5/5 in upper/lower extremities   Sensation intact in upper/lower extremities   Normal gait.  CN I not tested  CN II grossly intact visual fields bilaterally. Did not visualize posterior eye.  CN III, IV, VI PERRLA and EOMs intact bilaterally  CN V Intact sensation to sharp and light touch to the face  CN VII facial movements symmetric  CN VIII not tested  CN IX, X no uvula deviation, symmetric rise of soft palate  CN XI 5/5  SCM and trapezius strength bilaterally  CN XII Midline tongue protrusion, symmetric L/R movements   Psychiatric:        Mood and Affect: Mood normal.        Behavior: Behavior normal.     (all labs ordered are listed, but only abnormal results are displayed) Labs Reviewed  BASIC METABOLIC PANEL WITH GFR - Abnormal; Notable for the following components:      Result Value   Glucose, Bld 103 (*)    All other components within normal limits  CBC - Abnormal; Notable for the following components:   WBC 3.8 (*)    RBC 5.34 (*)    MCV 75.1 (*)    MCH 23.4 (*)    RDW 22.5 (*)    All other components within normal limits  PREGNANCY, URINE  TROPONIN T, HIGH SENSITIVITY  TROPONIN T, HIGH SENSITIVITY    EKG: None  Radiology: DG Chest 2 View Result Date: 05/01/2024 EXAM: 2 VIEW(S) XRAY OF THE CHEST 05/01/2024 12:30:00 PM COMPARISON: 10/03/2017 CLINICAL HISTORY: chest pain FINDINGS: LUNGS AND PLEURA: No focal pulmonary opacity. No pleural effusion. No pneumothorax. HEART AND MEDIASTINUM: No acute abnormality of the cardiac and mediastinal silhouettes. BONES AND SOFT TISSUES: No acute osseous abnormality. IMPRESSION: 1. No active cardiopulmonary disease. Electronically signed by: Franky Crease MD 05/01/2024 01:06 PM EST RP Workstation: [id number]     Procedures   Medications Ordered in the ED  lactated ringers  bolus 1,000 mL (0 mLs Intravenous Stopped 05/01/24 1654)  ketorolac  (TORADOL ) 15 MG/ML injection 15 mg (15 mg Intravenous Given 05/01/24 1544)                                    Medical Decision Making Amount and/or Complexity of Data Reviewed Labs: ordered. Radiology: ordered.  Risk Prescription drug management.   Patient is a 43 year old female with past medical history significant for anxiety, palpitations, reflux, anemia  Patient presents emergency room today with complaints of crampy left-sided chest pain that she describes as intermittent over the past 5 days.  She  endorses what she describes as a 5/10 pain that is a crampy.  Does not seem to be worse with breathing or exertion.  Seems to be worse after eating and she describes this as intermittent.  No other provoking factors.  She has taken no medications for her symptoms.  No recent surgeries, hospitalization, long travel, hemoptysis, estrogen containing OCP, cancer history.  No unilateral leg swelling.  No history of PE or VTE.   The emergent causes of chest pain include: Acute coronary syndrome,  tamponade, pericarditis/myocarditis, aortic dissection, pulmonary embolism, tension pneumothorax, pneumonia, and esophageal rupture.  I do not believe the patient has an emergent cause of chest pain, other urgent/non-acute considerations include, but are not limited to: chronic angina, aortic stenosis, cardiomyopathy, mitral valve prolapse, pulmonary hypertension, aortic insufficiency, right ventricular hypertrophy, pleuritis, bronchitis, pneumothorax, tumor, gastroesophageal reflux disease (GERD), esophageal spasm, Mallory-Weiss syndrome, peptic ulcer disease, pancreatitis, functional gastrointestinal pain, cervical or thoracic disk disease or arthritis, shoulder arthritis, costochondritis, subacromial bursitis, anxiety or panic attack, herpes zoster, breast disorders, chest wall tumors, thoracic outlet syndrome, mediastinitis.    Troponin undetectable, pregnancy test negative, labs unremarkable apart from mild leukopenia on CBC  Chest x-ray without infiltrate or pneumothorax  EKG nonischemic.  Sinus tachycardia.  I didnotice that upon entering the room patient's heart rate would increase from less than 100 to greater than 100.  This happened multiple times.  I do think that there is an element of anxiety at play however this certainly may be an element of gastritis or peptic ulcer disease at play as well.  I recommend over-the-counter omeprazole , hydroxyzine  as needed for anxiety and follow-up with primary care.   Return precautions emergency room provided.  Final diagnoses:  Atypical chest pain    ED Discharge Orders          Ordered    hydrOXYzine  (ATARAX ) 25 MG tablet  Every 6 hours        05/01/24 1842               Neldon Hamp RAMAN, [place] 05/02/24 1107    Mannie Pac T, DO 05/05/24 2303  "

## 2024-05-01 NOTE — Discharge Instructions (Addendum)
 I want you to even hide your symptoms and return to the emergency room for any new or concerning symptoms.  I have written you prescription for hydroxyzine  you can take this as needed.  Make sure you are drinking plenty of water .  If you are able to take tomorrow off and do some restful, relaxing things I recommend doing this.  I have written you a work note.

## 2024-05-01 NOTE — ED Notes (Signed)
 EKG performed, given to Gi Or Norman MD

## 2024-05-06 ENCOUNTER — Other Ambulatory Visit (HOSPITAL_BASED_OUTPATIENT_CLINIC_OR_DEPARTMENT_OTHER): Payer: Self-pay

## 2024-05-06 MED ORDER — BACLOFEN 5 MG PO TABS
5.0000 mg | ORAL_TABLET | Freq: Three times a day (TID) | ORAL | 0 refills | Status: AC | PRN
  Filled 2024-05-06: qty 90, 30d supply, fill #0

## 2024-05-06 NOTE — Telephone Encounter (Signed)
 Patient sch for 05/13/2024

## 2024-05-08 ENCOUNTER — Telehealth: Payer: Self-pay | Admitting: Neurology

## 2024-05-08 ENCOUNTER — Ambulatory Visit: Admitting: Neurology

## 2024-05-08 ENCOUNTER — Encounter: Payer: Self-pay | Admitting: Neurology

## 2024-05-08 VITALS — BP 123/73 | HR 92 | Ht 61.0 in | Wt 182.8 lb

## 2024-05-08 DIAGNOSIS — M542 Cervicalgia: Secondary | ICD-10-CM

## 2024-05-08 DIAGNOSIS — E236 Other disorders of pituitary gland: Secondary | ICD-10-CM | POA: Diagnosis not present

## 2024-05-08 DIAGNOSIS — G8929 Other chronic pain: Secondary | ICD-10-CM | POA: Diagnosis not present

## 2024-05-08 DIAGNOSIS — R2689 Other abnormalities of gait and mobility: Secondary | ICD-10-CM

## 2024-05-08 DIAGNOSIS — G4486 Cervicogenic headache: Secondary | ICD-10-CM

## 2024-05-08 DIAGNOSIS — R4589 Other symptoms and signs involving emotional state: Secondary | ICD-10-CM

## 2024-05-08 DIAGNOSIS — R519 Headache, unspecified: Secondary | ICD-10-CM

## 2024-05-08 DIAGNOSIS — R0689 Other abnormalities of breathing: Secondary | ICD-10-CM | POA: Diagnosis not present

## 2024-05-08 DIAGNOSIS — F439 Reaction to severe stress, unspecified: Secondary | ICD-10-CM | POA: Diagnosis not present

## 2024-05-08 DIAGNOSIS — M5489 Other dorsalgia: Secondary | ICD-10-CM | POA: Diagnosis not present

## 2024-05-08 NOTE — Progress Notes (Signed)
 Subjective:    Patient ID: Kiara Hall is a 44 y.o. Hall.  HPI    True Mar, MD, PhD [facility] [address], [address] [address] [place], [place] [address]  I saw patient, Kiara Hall, as a referral from the emergency room for evaluation of her headache.  The patient is unaccompanied today.  Kiara Hall is a 44 year old Hall with an underlying medical history of reflux disease, obesity, Anemia, anxiety, palpitations, and degenerative cervical disc disease, who reports recurrent pressure-like headaches in the back of her head.  Symptoms started in the beginning of December and she felt off balance, has not fallen thankfully.  She still feels that her balance has not been normal.  Similar symptoms were noted when she was first diagnosed with vitamin D  deficiency in the past.  She has had very intermittent blurry vision but no sustained visual symptoms and recently had an eye examination with optometry and does not need any eyeglasses or any further intervention.  She has had stress.  Her husband is currently dealing with his diagnosis of IIH diagnosis, she reports and she is familiar with this and workup including lumbar puncture and she has been quite anxious.  She presented to the emergency room on 04/20/2024 with a chief complaint of headache.  I reviewed emergency room records.  She was noted to be anxious.  She has had anxiety for years.  She is particularly anxious about health.  She tries to get enough sleep.  She is not aware of any snoring.  She is familiar with sleep apnea diagnosis as her husband has OSA.  She lives at home with her family including husband and 3 children, ages 22, 56 and 45.  She has a history of vitamin D  deficiency but most recently her cardiologist checked her level on 05/07/2024 and it was 41.6.  She previously saw Dr. Pietro for palpitations.  She is scheduled for a stress echo and a heart monitor.  She denies any sudden onset  one-sided weakness or numbness or tingling or droopy face or slurring of speech.  She has reduced and eliminated daily caffeine.  She has woken up with a sense of gasping for air.  She has never had a sleep study.  She has had palpitations.  She has chronic neck and low back pain and feels like her muscles are tense especially in the neck and also in the jaw area.  She has tried an over-the-counter bite guard but it did not feel comfortable.   She had a head CT without contrast on 04/20/2024 and I reviewed the results:   IMPRESSION: 1. No acute intracranial abnormality. 2. Partially empty sella, which can be incidental; correlate clinically for idiopathic intracranial hypertension if indicated.    In addition, I personally and independently reviewed images through the PACS system, and I agree with the partially empty sella findings.  She had a head CT without contrast through [facility] health emergency room on 10/27/2021 with indication of headache and I reviewed the results:   IMPRESSION: Negative CT head    In addition, I personally and independently reviewed images through the PACS system.  I do believe she had a partially empty sella finding then as well.    She also presented to the emergency room on 05/01/2024 with a chief complaint of chest pain.  I reviewed the emergency room records.  Labs were benign.  Chest x-ray benign.  EKG showed sinus tachycardia.  She was felt to have  atypical chest pain.  Of note, she was found to have a partially empty sella incidentally on a cervical spine MRI in January 2024.  I reviewed the cervical spine MRI from 05/29/2022:   IMPRESSION: 1. Small central disc protrusions at C3-4 and C4-5 with minimal flattening of the ventral cord, but no cord signal changes or significant stenosis. 2. Broad-based left paracentral disc osteophyte complex at C5-6 with resultant mild left C6 foraminal stenosis. 3. Small left foraminal annular fissure at C6-7  without stenosis. 4. Partially empty sella. While this finding is often incidental in nature and of no clinical significance, this can also be seen in the setting of idiopathic intracranial hypertension.  She has not had a sleep study.  She has an appointment pending with her primary care provider next week.  She saw cardiology yesterday through [facility] health.  I reviewed the office visit note.  A stress echocardiogram has been considered.  A heart monitor was ordered.  Lifestyle modification was discussed.      Her Past Medical History Is Significant For: Past Medical History:  Diagnosis Date   Anemia    Anxiety    Chicken pox    Complication of anesthesia    hypotension with one of the spinal   ELEVATED BP READING WITHOUT DX HYPERTENSION 12/17/2007   Qualifier: Diagnosis of  By: Tanda MD, Jay     GERD (gastroesophageal reflux disease)    Palpitation     Her Past Surgical History Is Significant For: Past Surgical History:  Procedure Laterality Date   BREAST BIOPSY Left    benign    CESAREAN SECTION N/A 02/09/2015   Procedure: CESAREAN SECTION;  Surgeon: Rexene Hoit, MD;  Location: [facility] ORS;  Service: Obstetrics;  Laterality: N/A;   CESAREAN SECTION N/A 02/05/2019   Procedure: CESAREAN SECTION;  Surgeon: Hoit Rexene, MD;  Location: [facility] LD ORS;  Service: Obstetrics;  Laterality: N/A;   CESAREAN SECTION Bilateral 04/22/2021   Procedure: REPEAT CESAREAN SECTION WITH BILATERAL SALPINGECTOMY;  Surgeon: Hoit Rexene, MD;  Location: [facility] LD ORS;  Service: Obstetrics;  Laterality: Bilateral;   TONSILLECTOMY      Her Family History Is Significant For: Family History  Problem Relation Age of Onset   Diabetes Mother    Hypertension Mother    Miscarriages / Stillbirths Sister    Hypertension Maternal Aunt    Heart disease Maternal Uncle 43   Stroke Maternal Grandmother    Hypertension Maternal Grandfather    Sleep apnea Neg Hx    Migraines Neg Hx    Headache Neg Hx     Her Social  History Is Significant For: Social History   Socioeconomic History   Marital status: Married    Spouse name: Not on file   Number of children: 3   Years of education: Not on file   Highest education level: Not on file  Occupational History   Not on file  Tobacco Use   Smoking status: Never   Smokeless tobacco: Never  Vaping Use   Vaping status: Never Used  Substance and Sexual Activity   Alcohol use: Yes    Alcohol/week: 5.0 standard drinks of alcohol    Types: 3 Glasses of wine, 2 Shots of liquor per week    Comment: Occasional   Drug use: No   Sexual activity: Yes    Birth control/protection: Condom  Other Topics Concern   Not on file  Social History Narrative   Pt lives with family    Pt works  Social Drivers of Health   Tobacco Use: Low Risk (05/08/2024)   Patient History    Smoking Tobacco Use: Never    Smokeless Tobacco Use: Never    Passive Exposure: Not on file  Financial Resource Strain: Low Risk (05/07/2024)   Received from [facility]   Overall Financial Resource Strain (CARDIA)    How hard is it for you to pay for the very basics like food, housing, medical care, and heating?: Not hard at all  Food Insecurity: No Food Insecurity (05/07/2024)   Received from [facility]   Epic    Within the past 12 months, you worried that your food would run out before you got the money to buy more.: Never true    Within the past 12 months, the food you bought just didn't last and you didn't have money to get more.: Never true  Transportation Needs: No Transportation Needs (05/07/2024)   Received from [facility]    In the past 12 months, has lack of transportation kept you from medical appointments or from getting medications?: No    In the past 12 months, has lack of transportation kept you from meetings, work, or from getting things needed for daily living?: No  Physical Activity: Not on file  Stress: Not on file  Social Connections: Unknown (01/28/2022)    Received from [facility]   Social Network    Social Network: Not on file  Depression (PHQ2-9): Low Risk (03/04/2024)   Depression (PHQ2-9)    PHQ-2 Score: 0  Alcohol Screen: Not on file  Housing: Low Risk (05/07/2024)   Received from [facility]    In the last 12 months, was there a time when you were not able to pay the mortgage or rent on time?: No    In the past 12 months, how many times have you moved where you were living?: 1    At any time in the past 12 months, were you homeless or living in a shelter (including now)?: No  Utilities: Not At Risk (05/07/2024)   Received from [facility]    In the past 12 months has the electric, gas, oil, or water  company threatened to shut off services in your home?: No  Health Literacy: Not on file    Her Allergies Are:  Allergies[1]:   Her Current Medications Are:  Outpatient Encounter Medications as of 05/08/2024  Medication Sig   Baclofen  5 MG TABS Take 1 tablet (5 mg total) by mouth 3 (three) times daily as needed.   Ferrous Fumarate  324 MG TABS Take 1 tablet by mouth daily at 12 noon.   hydrOXYzine  (ATARAX ) 25 MG tablet Take 1 tablet (25 mg total) by mouth every 6 (six) hours for 9 days.   tranexamic acid  (LYSTEDA ) 650 MG TABS tablet Take 1,300 mg by mouth 3 (three) times daily.   amoxicillin -clavulanate (AUGMENTIN ) 875-125 MG tablet Take 1 tablet by mouth 2 (two) times daily.   dextromethorphan-guaiFENesin  (MUCINEX  DM) 30-600 MG 12hr tablet Take 1 tablet by mouth 2 (two) times daily as needed for cough.   pantoprazole  (PROTONIX ) 20 MG tablet Take 1 tablet (20 mg total) by mouth daily.   semaglutide -weight management (WEGOVY ) 2.4 MG/0.75ML SOAJ SQ injection Inject 2.4 mg into the skin once a week.   No facility-administered encounter medications on file as of 05/08/2024.  :   Review of Systems:  Out of a complete 14 point review of systems, all  are reviewed and negative with the exception of these symptoms as listed  below:  Review of Systems  Objective:  Neurological Exam  Physical Exam Physical Examination:   Vitals:   05/08/24 0952  BP: 123/73  Pulse: 92    General Examination: The patient is a very pleasant 44 y.o. Hall in no acute distress. She appears well-developed and well-nourished and well groomed.   HEENT: Normocephalic, atraumatic, pupils are equal, round and reactive to light, extraocular tracking is good without limitation to gaze excursion or nystagmus noted. No photophobia.  Benign funduscopic exam.  No Corrective eye glasses in place. Hearing is grossly intact.  Face is symmetric with normal facial animation and normal facial sensation to light touch, temperature and vibration sense.SABRA Speech is clear without dysarthria. There is no hypophonia. There is no lip, neck/head, jaw or voice tremor. Neck is supple with full range of passive and active motion. There are no carotid bruits on auscultation.  Airway/Oropharynx exam reveals: mild mouth dryness, good dental hygiene and mild airway crowding secondary to small airway but otherwise benign findings with Mallampati class I, tonsils absent, neck circumference 13 5/8 inches, mild overbite noted.  Tongue protrudes centrally and palate elevates symmetrically.   Chest: Clear to auscultation without wheezing, rhonchi or crackles noted.  Heart: S1+S2+0, regular and normal without murmurs, rubs or gallops noted.   Abdomen: Soft, non-tender and non-distended.  Extremities: There is no pitting edema in the distal lower extremities bilaterally.   Skin: Warm and dry without trophic changes noted.   Musculoskeletal: exam reveals no obvious joint deformities.   Neurologically:  Mental status: The patient is awake, alert and oriented in all 4 spheres. Her immediate and remote memory, attention, language skills and fund of knowledge are appropriate. There is no evidence of aphasia, agnosia, apraxia or anomia. Speech is clear with normal prosody  and enunciation. Thought process is linear. Mood is normal and affect is normal.  Cranial nerves II - XII are as described above under HEENT exam.  Motor exam: Normal bulk, strength and tone is noted. There is no obvious action or resting tremor.  No drift or rebound, no postural or intention tremor. Reflexes 1-2+ throughout, toes are downgoing bilaterally. Fine motor skills and coordination: Intact finger taps, hand movements and rapid alternating patting with both upper extremities, normal foot taps bilaterally in the lower extremities.  Cerebellar testing: No dysmetria or intention tremor. There is no truncal or gait ataxia.  Normal finger-to-nose, normal heel-to-shin bilaterally. Sensory exam: intact to light touch, temperature and vibration sense in the upper and lower extremities.  Romberg negative. Gait, station and balance: She stands easily. No veering to one side is noted. No leaning to one side is noted. Posture is age-appropriate and stance is narrow based. Gait shows normal stride length and normal pace. No problems turning are noted.  Normal tandem walk.  Assessment and Plan:   In summary, Kiara Hall is a very pleasant 44 year old Hall with an underlying medical history of reflux disease, obesity, Anemia, anxiety, palpitations, and degenerative cervical disc disease, who presents for evaluation of her recurrent headaches of essentially several years duration but recent flareup noted.  She has cervicogenic headaches, she has head pressure, we talked about different causes of headaches including intracranial hypertension, stress, muscle tension, underlying sleep disordered breathing not excluded.  Stress on the eyes can also cause headaches.  She finding of partially empty sella was noted on a cervical spine MRI in 2024 and a scan in  2023 also showed it in my opinion.  It was not officially reported on her scan report.  At any rate, this was explained to her and further workup is  certainly justified at this time.  Neurological exam is nonfocal and she is reassured in this regard.  I did not recommend any new medications from my end of things at this time.  She has a prescription for hydroxyzine  and baclofen  but has not utilized these medications.  She is advised that hydroxyzine  can help for situational anxiety as needed but can be quite sedating and can affect her balance adversely.  Baclofen  was prescribed by her GI doctor.  She had a recent upper GI endoscopy.  She is advised that it can be sedating but may help with muscle tension and spasms.  She is encouraged to talk to her PCP about more consistent anxiety management.  She had side effects with sertraline and BuSpar  in the past.  She may be a candidate for Wellbutrin.  It may even help her with food cravings and help her lose weight. This was an extended visit of over 70 minutes of high complexity with copious record review involved including outside electronic records as well as: Electronic records, personal review of imaging tests, considerable counseling and coordination of care, comprehensive exam and addressing multiple issues. Below is a summary of my recommendations and our discussion points from today's visit, based on chart review, history and examination. They were given these instructions verbally during the visit in detail and also in writing in the MyChart after visit summary (AVS), which they can access electronically. <<  As explained, a partially empty sella is typically an incidental finding and typically does not cause much in the way of symptoms but we can certainly investigate things further since you do have intermittent headaches. We will do a brain scan, called MRI and call you with the test results. We will have to schedule you for this on a separate date. This test requires authorization from your insurance, and we will take care of the insurance process. Sometimes the partially empty sella is associated  with a condition called idiopathic intracranial hypertension or IIH.  I will refer you to an ophthalmologist for a formal eye examination. I recommend a full, dilated, eye exam including visual field testing, to see if there is any loss of peripheral vision or any evidence of papilledema which means swelling of your eye nerve or nerves on either side. Depending on your eye exam, we will consider a LP (lumbar puncture/spinal tap) with pressure testing on your spinal fluid and routine fluid testing. Eventually, if the spinal fluid pressure is indeed elevated, we will consider starting her on a medication called diamox to help keep her spinal fluid pressure at bay.  Some people need more than 1 spinal tap over time. Please follow-up with your PCP to discuss weight loss options and anxiety management.  Losing weight can improve conditions like intracranial hypertension.  Wellbutrin may be a good choice for you, it can even reduce food cravings and help you with weight loss and can help with anxiety as well.  Please talk to your PCP about this as discussed. We will proceed with a home sleep test to investigate if you have any obstructive sleep apnea as treatment of obstructive sleep apnea may help your headaches and your anxiety even and help with keeping the intracranial fluid pressure at bay as well. We will plan a follow-up after testing.  We will keep  you posted as to your test results by phone call in the interim.  Thankfully, your neurological exam is nonfocal today which is very reassuring. You can always get in touch with us  via MyChart message.>>    True Mar, MD, PhD     [1]  Allergies Allergen Reactions   Augmentin  [Amoxicillin -Pot Clavulanate] Shortness Of Breath    Palpitations/sob   Metronidazole Hives, Shortness Of Breath and Anaphylaxis    Flagyl/rash

## 2024-05-08 NOTE — Patient Instructions (Addendum)
 It was nice to meet you today.  As explained, a partially empty sella is typically an incidental finding and typically does not cause much in the way of symptoms but we can certainly investigate things further since you do have intermittent headaches. We will do a brain scan, called MRI and call you with the test results. We will have to schedule you for this on a separate date. This test requires authorization from your insurance, and we will take care of the insurance process. Sometimes the partially empty sella is associated with a condition called idiopathic intracranial hypertension or IIH.  I will refer you to an ophthalmologist for a formal eye examination. I recommend a full, dilated, eye exam including visual field testing, to see if there is any loss of peripheral vision or any evidence of papilledema which means swelling of your eye nerve or nerves on either side. Depending on your eye exam, we will consider a LP (lumbar puncture/spinal tap) with pressure testing on your spinal fluid and routine fluid testing. Eventually, if the spinal fluid pressure is indeed elevated, we will consider starting her on a medication called diamox to help keep her spinal fluid pressure at bay.  Some people need more than 1 spinal tap over time. Please follow-up with your PCP to discuss weight loss options and anxiety management.  Losing weight can improve conditions like intracranial hypertension.  Wellbutrin may be a good choice for you, it can even reduce food cravings and help you with weight loss and can help with anxiety as well.  Please talk to your PCP about this as discussed. We will proceed with a home sleep test to investigate if you have any obstructive sleep apnea as treatment of obstructive sleep apnea may help your headaches and your anxiety even and help with keeping the intracranial fluid pressure at bay as well. We will plan a follow-up after testing.  We will keep you posted as to your test  results by phone call in the interim.  Thankfully, your neurological exam is nonfocal today which is very reassuring. You can always get in touch with us  via MyChart message.

## 2024-05-08 NOTE — Telephone Encounter (Signed)
 Referral for ophthalmology fax to [facility] as requested. Phone: [phone], Fax: [phone]

## 2024-05-13 ENCOUNTER — Encounter: Payer: Self-pay | Admitting: Nurse Practitioner

## 2024-05-13 ENCOUNTER — Ambulatory Visit: Admitting: Nurse Practitioner

## 2024-05-13 VITALS — BP 110/74 | HR 88 | Temp 97.9°F | Ht 61.0 in | Wt 184.0 lb

## 2024-05-13 DIAGNOSIS — K219 Gastro-esophageal reflux disease without esophagitis: Secondary | ICD-10-CM

## 2024-05-13 DIAGNOSIS — G44229 Chronic tension-type headache, not intractable: Secondary | ICD-10-CM

## 2024-05-13 DIAGNOSIS — Z6834 Body mass index (BMI) 34.0-34.9, adult: Secondary | ICD-10-CM | POA: Diagnosis not present

## 2024-05-13 DIAGNOSIS — E66811 Obesity, class 1: Secondary | ICD-10-CM | POA: Diagnosis not present

## 2024-05-13 DIAGNOSIS — E6609 Other obesity due to excess calories: Secondary | ICD-10-CM | POA: Diagnosis not present

## 2024-05-13 NOTE — Progress Notes (Signed)
 "               Established Patient Visit  Patient: Kiara Hall   DOB: 01/08/1981   44 y.o. Female  MRN: [id number] Visit Date: 05/13/2024  Subjective:    Chief Complaint  Patient presents with   Follow-up    Follow-up from emergency room visits x 2. Patient is seeing Cardiology, Neurology, and GI for evaluation.    HPI Gastroesophageal reflux disease Reports epigastric squeezing and atypical chest pain, nausea, soft stool in AM, dizziness and fatigue x 4weeks. Pantoprazole  discontinued in November. She states, this med caused sharp chest pain despite decreased med dose from 40mg  to 20mg . Last wegovy  injection 04/26/2024. Current use of famotidine 20mg  BID with some relief. Check H. Pylori today She has upcoming appointment with GI 06/03/2024  Obesity Last wegovy  dose 04/26/2024. Opted not to continue med due to GI symptoms: soft stools in AM, nausea, fatigue, epigastric pain. Wt Readings from Last 3 Encounters:  05/13/24 184 lb (83.5 kg)  05/08/24 182 lb 12.8 oz (82.9 kg)  05/01/24 185 lb (83.9 kg)    Encouraged to maintain a heart healthy diet  Tension headache, chronic Chronic waxing and waning, associated with dizziness, brain fog, and fatigue. Minimal improvement with OVER THE COUNTER meds Had appointment with neurology: CT indicates partially empty sella without ICH. Pending MRI, appointment with ophthalmology and sleep specialist.  Reviewed medical, surgical, and social history today  Medications: Show/hide medication list[1] Reviewed past medical and social history.   ROS per HPI above      Objective:  BP 110/74   Pulse 88   Temp 97.9 F (36.6 C)   Ht 5' 1 (1.549 m)   Wt 184 lb (83.5 kg)   LMP 04/18/2024 (Exact Date)   SpO2 98%   BMI 34.77 kg/m      Physical Exam Vitals and nursing note reviewed.  Cardiovascular:     Rate and Rhythm: Normal rate.     Pulses: Normal pulses.  Pulmonary:     Effort: Pulmonary effort is normal.  Neurological:      Mental Status: She is alert and oriented to person, place, and time.  Psychiatric:        Mood and Affect: Mood is anxious.        Speech: Speech normal.        Behavior: Behavior is cooperative.        Cognition and Memory: Cognition normal.     No results found for any visits on 05/13/24.    Assessment & Plan:    Problem List Items Addressed This Visit     Gastroesophageal reflux disease - Primary   Reports epigastric squeezing and atypical chest pain, nausea, soft stool in AM, dizziness and fatigue x 4weeks. Pantoprazole  discontinued in November. She states, this med caused sharp chest pain despite decreased med dose from 40mg  to 20mg . Last wegovy  injection 04/26/2024. Current use of famotidine 20mg  BID with some relief. Check H. Pylori today She has upcoming appointment with GI 06/03/2024      Relevant Medications   famotidine (PEPCID) 20 MG tablet   Other Relevant Orders   H. pylori breath test   Obesity   Last wegovy  dose 04/26/2024. Opted not to continue med due to GI symptoms: soft stools in AM, nausea, fatigue, epigastric pain. Wt Readings from Last 3 Encounters:  05/13/24 184 lb (83.5 kg)  05/08/24 182 lb 12.8 oz (82.9 kg)  05/01/24 185 lb (83.9 kg)  Encouraged to maintain a heart healthy diet      Tension headache, chronic   Chronic waxing and waning, associated with dizziness, brain fog, and fatigue. Minimal improvement with OVER THE COUNTER meds Had appointment with neurology: CT indicates partially empty sella without ICH. Pending MRI, appointment with ophthalmology and sleep specialist.      Other Visit Diagnoses       Dizziness          Return if symptoms worsen or fail to improve, for Schedule f/up after completed work-up with cardiology, GI, and neurology.SABRA Roselie Mood, NP      [1]  Outpatient Medications Prior to Visit  Medication Sig   Baclofen  5 MG TABS Take 1 tablet (5 mg total) by mouth 3 (three) times daily as needed.    famotidine (PEPCID) 20 MG tablet Take 20 mg by mouth 2 (two) times daily.   Ferrous Fumarate  324 MG TABS Take 1 tablet by mouth daily at 12 noon.   tranexamic acid  (LYSTEDA ) 650 MG TABS tablet Take 1,300 mg by mouth 3 (three) times daily.   [DISCONTINUED] amoxicillin -clavulanate (AUGMENTIN ) 875-125 MG tablet Take 1 tablet by mouth 2 (two) times daily. (Patient not taking: Reported on 05/13/2024)   [DISCONTINUED] dextromethorphan-guaiFENesin  (MUCINEX  DM) 30-600 MG 12hr tablet Take 1 tablet by mouth 2 (two) times daily as needed for cough.   [DISCONTINUED] pantoprazole  (PROTONIX ) 20 MG tablet Take 1 tablet (20 mg total) by mouth daily. (Patient not taking: Reported on 05/13/2024)   [DISCONTINUED] semaglutide -weight management (WEGOVY ) 2.4 MG/0.75ML SOAJ SQ injection Inject 2.4 mg into the skin once a week. (Patient not taking: Reported on 05/13/2024)   No facility-administered medications prior to visit.   "

## 2024-05-13 NOTE — Assessment & Plan Note (Signed)
 Last wegovy  dose 04/26/2024. Opted not to continue med due to GI symptoms: soft stools in AM, nausea, fatigue, epigastric pain. Wt Readings from Last 3 Encounters:  05/13/24 184 lb (83.5 kg)  05/08/24 182 lb 12.8 oz (82.9 kg)  05/01/24 185 lb (83.9 kg)    Encouraged to maintain a heart healthy diet

## 2024-05-13 NOTE — Assessment & Plan Note (Addendum)
 Reports epigastric squeezing and atypical chest pain, nausea, soft stool in AM, dizziness and fatigue x 4weeks. Pantoprazole  discontinued in November. She states, this med caused sharp chest pain despite decreased med dose from 40mg  to 20mg . Last wegovy  injection 04/26/2024. Current use of famotidine 20mg  BID with some relief. Check H. Pylori today She has upcoming appointment with GI 06/03/2024

## 2024-05-13 NOTE — Assessment & Plan Note (Signed)
 Chronic waxing and waning, associated with dizziness, brain fog, and fatigue. Minimal improvement with OVER THE COUNTER meds Had appointment with neurology: CT indicates partially empty sella without ICH. Pending MRI, appointment with ophthalmology and sleep specialist.

## 2024-05-13 NOTE — Patient Instructions (Signed)
 Schedule f/up after completed work-up with cardiology, GI, and neurology.

## 2024-05-14 ENCOUNTER — Ambulatory Visit: Payer: Self-pay | Admitting: Nurse Practitioner

## 2024-05-14 LAB — H. PYLORI BREATH TEST: H. pylori Breath Test: NOT DETECTED

## 2024-05-15 ENCOUNTER — Other Ambulatory Visit (HOSPITAL_BASED_OUTPATIENT_CLINIC_OR_DEPARTMENT_OTHER): Payer: Self-pay

## 2024-05-15 ENCOUNTER — Other Ambulatory Visit

## 2024-05-15 DIAGNOSIS — R519 Headache, unspecified: Secondary | ICD-10-CM

## 2024-05-15 DIAGNOSIS — G4486 Cervicogenic headache: Secondary | ICD-10-CM

## 2024-05-15 DIAGNOSIS — R0689 Other abnormalities of breathing: Secondary | ICD-10-CM | POA: Diagnosis not present

## 2024-05-15 DIAGNOSIS — E236 Other disorders of pituitary gland: Secondary | ICD-10-CM | POA: Diagnosis not present

## 2024-05-15 DIAGNOSIS — M5489 Other dorsalgia: Secondary | ICD-10-CM

## 2024-05-15 DIAGNOSIS — M542 Cervicalgia: Secondary | ICD-10-CM

## 2024-05-15 DIAGNOSIS — R4589 Other symptoms and signs involving emotional state: Secondary | ICD-10-CM | POA: Diagnosis not present

## 2024-05-15 DIAGNOSIS — R2689 Other abnormalities of gait and mobility: Secondary | ICD-10-CM | POA: Diagnosis not present

## 2024-05-15 DIAGNOSIS — F439 Reaction to severe stress, unspecified: Secondary | ICD-10-CM

## 2024-05-15 DIAGNOSIS — G8929 Other chronic pain: Secondary | ICD-10-CM

## 2024-05-15 MED ORDER — GADOBENATE DIMEGLUMINE 529 MG/ML IV SOLN
20.0000 mL | Freq: Once | INTRAVENOUS | Status: AC | PRN
  Administered 2024-05-15: 20 mL via INTRAVENOUS

## 2024-05-16 ENCOUNTER — Ambulatory Visit: Payer: Self-pay | Admitting: Neurology

## 2024-05-20 ENCOUNTER — Other Ambulatory Visit (HOSPITAL_BASED_OUTPATIENT_CLINIC_OR_DEPARTMENT_OTHER): Payer: Self-pay

## 2024-05-23 NOTE — Telephone Encounter (Signed)
 I reviewed patient's office visit note with [facility].  She saw Clem Brands, PA on 05/22/2024. <<Impression/plan: Headache.  No ocular etiology for headache noted.  No signs/evidence of IIH.  No pulsatile tinnitus, no visual obscuration, no optic nerve edema, no visual field defect nor EBS.  Patient encouraged to follow-up with Dr. Tanette Chauca.>>  A follow-up in 5 to 6 months was recommended.  I will have the visit note scanned into her chart. FYI, nothing further needed.

## 2024-05-23 NOTE — Telephone Encounter (Signed)
 Note received from [facility] dated 05/22/24. Placed in Dr Obie office for review.

## 2024-05-29 ENCOUNTER — Other Ambulatory Visit: Payer: Self-pay

## 2024-05-29 ENCOUNTER — Other Ambulatory Visit (HOSPITAL_BASED_OUTPATIENT_CLINIC_OR_DEPARTMENT_OTHER): Payer: Self-pay

## 2024-05-29 MED ORDER — TRANEXAMIC ACID 650 MG PO TABS
1300.0000 mg | ORAL_TABLET | Freq: Three times a day (TID) | ORAL | 8 refills | Status: AC
  Filled 2024-05-29: qty 30, 5d supply, fill #0

## 2024-05-31 ENCOUNTER — Other Ambulatory Visit (HOSPITAL_BASED_OUTPATIENT_CLINIC_OR_DEPARTMENT_OTHER): Payer: Self-pay

## 2024-05-31 ENCOUNTER — Other Ambulatory Visit: Payer: Self-pay | Admitting: Oncology

## 2024-05-31 DIAGNOSIS — D500 Iron deficiency anemia secondary to blood loss (chronic): Secondary | ICD-10-CM

## 2024-05-31 MED ORDER — FERROUS FUMARATE 324 MG PO TABS
1.0000 | ORAL_TABLET | Freq: Every day | ORAL | 5 refills | Status: AC
  Filled 2024-05-31: qty 30, 30d supply, fill #0

## 2024-06-05 ENCOUNTER — Ambulatory Visit: Admitting: Nurse Practitioner
